# Patient Record
Sex: Female | Born: 1950 | Race: Black or African American | Hispanic: No | Marital: Married | Smoking: Never smoker
Health system: Southern US, Community
[De-identification: ages and names within clinical notes are randomized; demographics above are authoritative.]

## PROBLEM LIST (undated history)

## (undated) DIAGNOSIS — I10 Essential (primary) hypertension: Secondary | ICD-10-CM

## (undated) DIAGNOSIS — N189 Chronic kidney disease, unspecified: Secondary | ICD-10-CM

## (undated) DIAGNOSIS — I5020 Unspecified systolic (congestive) heart failure: Secondary | ICD-10-CM

## (undated) DIAGNOSIS — I428 Other cardiomyopathies: Secondary | ICD-10-CM

## (undated) DIAGNOSIS — E7800 Pure hypercholesterolemia, unspecified: Secondary | ICD-10-CM

## (undated) HISTORY — PX: FRACTURE SURGERY: SHX138

---

## 1996-04-11 HISTORY — PX: ABDOMINAL HYSTERECTOMY: SHX81

## 2001-11-07 ENCOUNTER — Encounter: Admission: RE | Admit: 2001-11-07 | Discharge: 2001-11-07 | Payer: Self-pay | Admitting: Family Medicine

## 2001-11-07 ENCOUNTER — Encounter: Payer: Self-pay | Admitting: Family Medicine

## 2002-10-29 ENCOUNTER — Encounter: Payer: Self-pay | Admitting: Otolaryngology

## 2002-10-29 ENCOUNTER — Encounter: Admission: RE | Admit: 2002-10-29 | Discharge: 2002-10-29 | Payer: Self-pay | Admitting: Otolaryngology

## 2003-04-10 ENCOUNTER — Other Ambulatory Visit: Admission: RE | Admit: 2003-04-10 | Discharge: 2003-04-10 | Payer: Self-pay | Admitting: Family Medicine

## 2004-05-07 ENCOUNTER — Other Ambulatory Visit: Admission: RE | Admit: 2004-05-07 | Discharge: 2004-05-07 | Payer: Self-pay | Admitting: Family Medicine

## 2005-06-02 ENCOUNTER — Emergency Department (HOSPITAL_COMMUNITY): Admission: EM | Admit: 2005-06-02 | Discharge: 2005-06-02 | Payer: Self-pay | Admitting: Emergency Medicine

## 2005-06-07 ENCOUNTER — Other Ambulatory Visit: Admission: RE | Admit: 2005-06-07 | Discharge: 2005-06-07 | Payer: Self-pay | Admitting: Family Medicine

## 2006-06-13 ENCOUNTER — Other Ambulatory Visit: Admission: RE | Admit: 2006-06-13 | Discharge: 2006-06-13 | Payer: Self-pay | Admitting: Family Medicine

## 2007-06-15 ENCOUNTER — Other Ambulatory Visit: Admission: RE | Admit: 2007-06-15 | Discharge: 2007-06-15 | Payer: Self-pay | Admitting: Family Medicine

## 2008-05-17 ENCOUNTER — Emergency Department (HOSPITAL_BASED_OUTPATIENT_CLINIC_OR_DEPARTMENT_OTHER): Admission: EM | Admit: 2008-05-17 | Discharge: 2008-05-17 | Payer: Self-pay | Admitting: Emergency Medicine

## 2008-05-17 ENCOUNTER — Ambulatory Visit: Payer: Self-pay | Admitting: Diagnostic Radiology

## 2008-06-18 ENCOUNTER — Other Ambulatory Visit: Admission: RE | Admit: 2008-06-18 | Discharge: 2008-06-18 | Payer: Self-pay | Admitting: Family Medicine

## 2008-11-05 ENCOUNTER — Encounter: Admission: RE | Admit: 2008-11-05 | Discharge: 2008-11-05 | Payer: Self-pay | Admitting: Family Medicine

## 2008-11-12 ENCOUNTER — Encounter: Admission: RE | Admit: 2008-11-12 | Discharge: 2008-11-12 | Payer: Self-pay | Admitting: Family Medicine

## 2008-11-26 ENCOUNTER — Ambulatory Visit: Payer: Self-pay | Admitting: Vascular Surgery

## 2009-07-29 ENCOUNTER — Emergency Department (HOSPITAL_BASED_OUTPATIENT_CLINIC_OR_DEPARTMENT_OTHER): Admission: EM | Admit: 2009-07-29 | Discharge: 2009-07-30 | Payer: Self-pay | Admitting: Emergency Medicine

## 2010-01-16 ENCOUNTER — Inpatient Hospital Stay (HOSPITAL_COMMUNITY): Admission: EM | Admit: 2010-01-16 | Discharge: 2010-01-19 | Payer: Self-pay | Admitting: Orthopedic Surgery

## 2010-01-16 ENCOUNTER — Ambulatory Visit: Payer: Self-pay | Admitting: Diagnostic Radiology

## 2010-01-16 ENCOUNTER — Encounter: Payer: Self-pay | Admitting: Orthopedic Surgery

## 2010-06-10 ENCOUNTER — Ambulatory Visit (HOSPITAL_COMMUNITY)
Admission: RE | Admit: 2010-06-10 | Discharge: 2010-06-10 | Disposition: A | Discharge status: Home / Self Care | Payer: BC Managed Care – PPO | Source: Ambulatory Visit | Attending: Orthopedic Surgery | Admitting: Orthopedic Surgery

## 2010-06-10 ENCOUNTER — Other Ambulatory Visit (HOSPITAL_COMMUNITY): Payer: Self-pay | Admitting: Orthopedic Surgery

## 2010-06-10 ENCOUNTER — Encounter (HOSPITAL_COMMUNITY)
Admission: RE | Admit: 2010-06-10 | Discharge: 2010-06-10 | Disposition: A | Discharge status: Home / Self Care | Payer: BC Managed Care – PPO | Source: Ambulatory Visit | Attending: Orthopedic Surgery | Admitting: Orthopedic Surgery

## 2010-06-10 DIAGNOSIS — S72309A Unspecified fracture of shaft of unspecified femur, initial encounter for closed fracture: Secondary | ICD-10-CM

## 2010-06-10 DIAGNOSIS — Z01810 Encounter for preprocedural cardiovascular examination: Secondary | ICD-10-CM | POA: Insufficient documentation

## 2010-06-10 DIAGNOSIS — I10 Essential (primary) hypertension: Secondary | ICD-10-CM | POA: Insufficient documentation

## 2010-06-10 DIAGNOSIS — Z01818 Encounter for other preprocedural examination: Secondary | ICD-10-CM | POA: Insufficient documentation

## 2010-06-10 DIAGNOSIS — Z01812 Encounter for preprocedural laboratory examination: Secondary | ICD-10-CM | POA: Insufficient documentation

## 2010-06-10 LAB — PROTIME-INR
INR: 1.01 (ref 0.00–1.49)
Prothrombin Time: 13.5 seconds (ref 11.6–15.2)

## 2010-06-10 LAB — COMPREHENSIVE METABOLIC PANEL
ALT: 19 U/L (ref 0–35)
Alkaline Phosphatase: 127 U/L — ABNORMAL HIGH (ref 39–117)
BUN: 20 mg/dL (ref 6–23)
CO2: 32 mEq/L (ref 19–32)
Calcium: 10.6 mg/dL — ABNORMAL HIGH (ref 8.4–10.5)
Creatinine, Ser: 1.27 mg/dL — ABNORMAL HIGH (ref 0.4–1.2)
GFR calc Af Amer: 52 mL/min — ABNORMAL LOW (ref >60)
GFR calc non Af Amer: 43 mL/min — ABNORMAL LOW (ref >60)
Glucose, Bld: 96 mg/dL (ref 70–99)
Potassium: 4.1 mEq/L (ref 3.5–5.1)
Sodium: 140 mEq/L (ref 135–145)
Total Bilirubin: 0.8 mg/dL (ref 0.3–1.2)
Total Protein: 7.3 g/dL (ref 6.0–8.3)

## 2010-06-10 LAB — URINALYSIS, ROUTINE W REFLEX MICROSCOPIC
Bilirubin Urine: NEGATIVE
Hgb urine dipstick: NEGATIVE
Ketones, ur: 15 mg/dL — AB
Nitrite: NEGATIVE
Specific Gravity, Urine: 1.027 (ref 1.005–1.030)
Urine Glucose, Fasting: NEGATIVE mg/dL
Urobilinogen, UA: 0.2 mg/dL (ref 0.0–1.0)
pH: 6 (ref 5.0–8.0)

## 2010-06-10 LAB — DIFFERENTIAL
Basophils Absolute: 0.1 10*3/uL (ref 0.0–0.1)
Basophils Relative: 1 % (ref 0–1)
Eosinophils Absolute: 0.3 10*3/uL (ref 0.0–0.7)
Eosinophils Relative: 6 % — ABNORMAL HIGH (ref 0–5)
Lymphocytes Relative: 43 % (ref 12–46)
Lymphs Abs: 2.2 10*3/uL (ref 0.7–4.0)
Monocytes Absolute: 0.5 10*3/uL (ref 0.1–1.0)
Neutro Abs: 2 10*3/uL (ref 1.7–7.7)
Neutrophils Relative %: 40 % — ABNORMAL LOW (ref 43–77)

## 2010-06-10 LAB — CBC
HCT: 46.5 % — ABNORMAL HIGH (ref 36.0–46.0)
Hemoglobin: 14.9 g/dL (ref 12.0–15.0)
MCHC: 32 g/dL (ref 30.0–36.0)
MCV: 81 fL (ref 78.0–100.0)
Platelets: 623 10*3/uL — ABNORMAL HIGH (ref 150–400)
RBC: 5.74 MIL/uL — ABNORMAL HIGH (ref 3.87–5.11)
RDW: 16.1 % — ABNORMAL HIGH (ref 11.5–15.5)
WBC: 5 10*3/uL (ref 4.0–10.5)

## 2010-06-10 LAB — URINE MICROSCOPIC-ADD ON

## 2010-06-10 LAB — SURGICAL PCR SCREEN: MRSA, PCR: NEGATIVE

## 2010-06-11 ENCOUNTER — Inpatient Hospital Stay (HOSPITAL_COMMUNITY): Payer: BC Managed Care – PPO

## 2010-06-11 ENCOUNTER — Inpatient Hospital Stay (HOSPITAL_COMMUNITY)
Admission: RE | Admit: 2010-06-11 | Discharge: 2010-06-15 | DRG: 210 | Disposition: A | Discharge status: Home Health | Payer: BC Managed Care – PPO | Source: Ambulatory Visit | Attending: Orthopedic Surgery | Admitting: Orthopedic Surgery

## 2010-06-11 DIAGNOSIS — Z86718 Personal history of other venous thrombosis and embolism: Secondary | ICD-10-CM

## 2010-06-11 DIAGNOSIS — E876 Hypokalemia: Secondary | ICD-10-CM | POA: Diagnosis not present

## 2010-06-11 DIAGNOSIS — I10 Essential (primary) hypertension: Secondary | ICD-10-CM | POA: Diagnosis present

## 2010-06-11 DIAGNOSIS — T84498A Other mechanical complication of other internal orthopedic devices, implants and grafts, initial encounter: Secondary | ICD-10-CM | POA: Diagnosis present

## 2010-06-11 DIAGNOSIS — Y658 Other specified misadventures during surgical and medical care: Secondary | ICD-10-CM | POA: Diagnosis not present

## 2010-06-11 DIAGNOSIS — Y9210 Unspecified residential institution as the place of occurrence of the external cause: Secondary | ICD-10-CM | POA: Diagnosis not present

## 2010-06-11 DIAGNOSIS — IMO0002 Reserved for concepts with insufficient information to code with codable children: Secondary | ICD-10-CM | POA: Diagnosis not present

## 2010-06-11 DIAGNOSIS — Z7982 Long term (current) use of aspirin: Secondary | ICD-10-CM

## 2010-06-11 DIAGNOSIS — D62 Acute posthemorrhagic anemia: Secondary | ICD-10-CM | POA: Diagnosis not present

## 2010-06-11 DIAGNOSIS — Z79899 Other long term (current) drug therapy: Secondary | ICD-10-CM

## 2010-06-11 DIAGNOSIS — E785 Hyperlipidemia, unspecified: Secondary | ICD-10-CM | POA: Diagnosis present

## 2010-06-11 DIAGNOSIS — Y831 Surgical operation with implant of artificial internal device as the cause of abnormal reaction of the patient, or of later complication, without mention of misadventure at the time of the procedure: Secondary | ICD-10-CM | POA: Diagnosis present

## 2010-06-11 LAB — GRAM STAIN

## 2010-06-11 LAB — HEMOGLOBIN AND HEMATOCRIT, BLOOD
HCT: 33.1 % — ABNORMAL LOW (ref 36.0–46.0)
Hemoglobin: 10.5 g/dL — ABNORMAL LOW (ref 12.0–15.0)

## 2010-06-12 LAB — CBC
MCH: 25.3 pg — ABNORMAL LOW (ref 26.0–34.0)
MCHC: 31 g/dL (ref 30.0–36.0)
MCV: 81.6 fL (ref 78.0–100.0)
Platelets: 379 10*3/uL (ref 150–400)
RBC: 3.91 MIL/uL (ref 3.87–5.11)

## 2010-06-12 LAB — BASIC METABOLIC PANEL
BUN: 10 mg/dL (ref 6–23)
CO2: 34 mEq/L — ABNORMAL HIGH (ref 19–32)
Calcium: 7.8 mg/dL — ABNORMAL LOW (ref 8.4–10.5)
Glucose, Bld: 117 mg/dL — ABNORMAL HIGH (ref 70–99)
Potassium: 3.8 mEq/L (ref 3.5–5.1)
Sodium: 143 mEq/L (ref 135–145)

## 2010-06-12 LAB — URINE CULTURE
Colony Count: >=100000
Culture  Setup Time: 201203012006

## 2010-06-12 LAB — GLUCOSE, CAPILLARY: Glucose-Capillary: 126 mg/dL — ABNORMAL HIGH (ref 70–99)

## 2010-06-12 LAB — PROTIME-INR: Prothrombin Time: 16.4 seconds — ABNORMAL HIGH (ref 11.6–15.2)

## 2010-06-13 LAB — BASIC METABOLIC PANEL
Calcium: 7.8 mg/dL — ABNORMAL LOW (ref 8.4–10.5)
Creatinine, Ser: 1.13 mg/dL (ref 0.4–1.2)
GFR calc Af Amer: 59 mL/min — ABNORMAL LOW (ref >60)
GFR calc non Af Amer: 49 mL/min — ABNORMAL LOW (ref >60)
Sodium: 137 mEq/L (ref 135–145)

## 2010-06-13 LAB — CBC
Platelets: 298 10*3/uL (ref 150–400)
RBC: 3.29 MIL/uL — ABNORMAL LOW (ref 3.87–5.11)
RDW: 16 % — ABNORMAL HIGH (ref 11.5–15.5)
WBC: 6.2 10*3/uL (ref 4.0–10.5)

## 2010-06-13 LAB — PROTIME-INR: INR: 2.17 — ABNORMAL HIGH (ref 0.00–1.49)

## 2010-06-14 LAB — HEMOGLOBIN AND HEMATOCRIT, BLOOD: Hemoglobin: 9.9 g/dL — ABNORMAL LOW (ref 12.0–15.0)

## 2010-06-14 LAB — CBC
MCV: 81.3 fL (ref 78.0–100.0)
Platelets: 368 10*3/uL (ref 150–400)
RDW: 16.1 % — ABNORMAL HIGH (ref 11.5–15.5)
WBC: 6.5 10*3/uL (ref 4.0–10.5)

## 2010-06-14 LAB — PROTIME-INR
INR: 1.69 — ABNORMAL HIGH (ref 0.00–1.49)
Prothrombin Time: 20.1 seconds — ABNORMAL HIGH (ref 11.6–15.2)

## 2010-06-14 LAB — BASIC METABOLIC PANEL
BUN: 8 mg/dL (ref 6–23)
BUN: 7 mg/dL (ref 6–23)
Calcium: 8 mg/dL — ABNORMAL LOW (ref 8.4–10.5)
Chloride: 98 mEq/L (ref 96–112)
Creatinine, Ser: 1.02 mg/dL (ref 0.4–1.2)
GFR calc Af Amer: >60 mL/min (ref >60)
GFR calc non Af Amer: 55 mL/min — ABNORMAL LOW (ref >60)
GFR calc non Af Amer: 55 mL/min — ABNORMAL LOW (ref >60)
Potassium: 3 mEq/L — ABNORMAL LOW (ref 3.5–5.1)

## 2010-06-14 LAB — TISSUE CULTURE: Culture: NO GROWTH

## 2010-06-14 NOTE — Op Note (Signed)
Tina, Jenkins                ACCOUNT NO.:  [id number]  MEDICAL RECORD NO.:  [id number]           PATIENT TYPE:  I  LOCATION:  5040                         FACILITY:  [facility]  PHYSICIAN:  Astrid Divine. Marcelino Scot, M.D. DATE OF BIRTH:  Feb 02, 1951  DATE OF PROCEDURE:  06/11/2010 DATE OF DISCHARGE:                              OPERATIVE REPORT   PREOPERATIVE DIAGNOSES: 1. Left subtrochanteric femur nonunion. 2. Broken intramedullary nail.  POSTOPERATIVE DIAGNOSES: 1. Left subtrochanteric femur nonunion. 2. Broken intramedullary nail.  PROCEDURE: 1. Repair of left subtrochanteric nonunion with direct repair,     autografting, and Infuse allografting as well as plating. 2. ORIF of left femoral neck. 3. Removal of deep implant broken nail.  SURGEON:  Astrid Divine. Marcelino Scot, M.D.  ASSISTANT:  Jari Pigg, [place].  ANESTHESIA:  General.  COMPLICATIONS:  Femoral neck fracture.  INTRAOPERATIVE FINDINGS:  No gross evidence of infection.  SPECIMENS:  Two anaerobic, aerobic, both sent to micro.  PATHOLOGIC FINDINGS:  No bacteria, no PMNs.  Ins: 1 unit of PRBC, approximately 3100 mL of crystalloid, 750 of Hextend.  URINE OUTPUT:  500 mL.  DISPOSITION:  PACU.  CONDITION:  Stable.  BRIEF SUMMARY AND INDICATIONS FOR PROCEDURE:  Tina Jenkins is a very pleasant 60 year old female who sustained a spontaneous fracture of her left subtrochanteric femur deemed secondary to prolonged bisphosphonate therapy.  She underwent intramedullary nailing using a Recon nail device in October 2011.  She initially did quite well but had persistent pain and had an acute change recently.  Followup x-rays demonstrated fracture of her nail and varus angulation with what appeared to be healing on the compression side and gapping on the lateral tension side.  We discussed preoperatively in the office as well as at the hospital the various methods available for repair of this fracture including revision  nailing with a more valgus oriented device or tension plating.  I did discuss this also with the orthopedic trauma colleagues and the consensus opinion was to proceed with tension band plating, the best device for which was felt to be the blade plate.  Tina Jenkins understood the risks to include failure of the nonunion repair, infection, nerve injury, vessel injury, blood loss, DVT, PE, heart attack, stroke, and multiple others and did wish to proceed.  BRIEF SUMMARY OF PROCEDURE:  Tina Jenkins received preoperative antibiotics, was taken to operating room where general anesthesia was induced.  Her left lower extremity was placed in traction on the traction bed.  The right hip was placed in the well leg in the lithotomy position, padding all prominences carefully and appropriately.  The old incisions were reopened and dissection carried down to the tip of the nail, the locking bolt distally, and the lag screws in the femoral head in the midportion of the wound.  These were taken out atraumatically.  I did leave one the screws engaged to help with engagement of the threads in the proximal portion of the nail.  I then placed 2 guidewires down the nail and used those to help me assist with withdrawal.  The canal was then reamed to 11.5 after  removal of the 10 mm nail to generate autograft for the fracture site internally as well as to collect any external reamings.  With a ball spike pusher, we did apply valgus orientation to the fracture during reaming.  Next attention was turned to the proximal femur where a guide pin was used to establish the appropriate position and orientation for the blade plate.  This was checked on AP and lateral images.  The seating chisel was then advanced into the proximal femur and carefully evaluated on AP and lateral images.  It appeared to be in good position; however, her neck and overall bone size was quite small.  During instrumentation, the femoral neck  appeared to fracture.  After evaluation in multiple planes, it was clear that we could not proceed as planned, instead the first priority was to repair the femoral neck.  This was done with a standard inverted triangle configuration of guide pins for 7.3 screws.  I then placed that the most central proximal cannulated screw in order to close the tension side of the fracture and then evaluated the femoral neck for possible placement of the blade plate.  This was a 135-degree plate that would facilitate both repair of the subtroch nonunion as well as a femoral neck fixation.  Carefully dialing in every 2 degrees or so of anteversion using c-arm for chisel orientation, it simply was too tight even in the center position of the bone to risk placement and I was concerned that this would ultimately result in further damage.  Consequently, we chose to go with the [facility] device selecting a 135-degree barrel plate configuration that would allow [place] to shift the subtroch portion into valgus.  This was placed in standard fashion over drilling a guide pin.  Prior to placing the DHS plate, I did incise the pseudocapsule around the subtroch nonunion removing the contents with a 10 blade and small curette and placing an Infuse sponge mixed with autograft from the reamings and allograft.  I did place some bone graft up through the lag screw hole which I then maneuvered medially toward the area of defect.  This was then followed by placement of a 70-mm lag screw, then the plate.  I did use the tensioner distally in order to shift the fracture in to some valgus and achieve compression on the tension side.  This appeared to be very effective.  Additional screws were then placed into the plate resulting in a valgus orientation with maintained repair of the femoral neck.  The wound was copiously irrigated and closed in standard layered fashion with 0 Vicryl for the vastus, #1 figure-of-eight for the tensor, 0 and  2- 0 for the deep and shallow subcu, and then staples for the skin. Sterile and gently compressive dressing was applied.  The patient was awakened from anesthesia and transported to PACU in stable condition. Ainsley Spinner, PA-C assisted me throughout the procedure as I did an additional scrub.  I did take down the well leg as soon as feasible and also placed the bed under the left operative leg as well.  The leg was brought into valgus to facilitate over correction of alignment for repair in this situation.  PROGNOSIS:  Tina Jenkins has had a very difficult problem with a subtrochanteric nonunion associated with bisphosphonate therapy and as such, the biologic environment of her fracture is challenging.  I did place Infuse allograft around the fracture site of the subtrochanteric region incising it with a knife and scraping out and  placing this graft prior to the plate.  Her difficulties were further complicated by intraoperative femoral neck fracture and clearly, she will need to be watched closely to make sure this goes on to unite.  This will change her weightbearing from as tolerated to touchdown.  She will be on aggressive DVT prophylaxis because of a history of DVT, which will include TED hose, mechanical SCDs or foot pumps as well as Lovenox while she transitions to long-term Coumadin therapy.  She will work with PT and OT.  Also, we will make sure that her [place] is restarted and she has adequate doses of vitamin D in addition to her calcium.  I have discussed these findings and factors with her husband and sister-in-law as well.     Astrid Divine. Marcelino Scot, M.D.     MHH/MEDQ  D:  06/11/2010  T:  06/12/2010  Job:  [id number]  Electronically Signed by Altamese Robinson M.D. on 06/14/2010 08:22:41 AM

## 2010-06-15 LAB — POCT I-STAT 4, (NA,K, GLUC, HGB,HCT)
Glucose, Bld: 143 mg/dL — ABNORMAL HIGH (ref 70–99)
Hemoglobin: 10.5 g/dL — ABNORMAL LOW (ref 12.0–15.0)
Hemoglobin: 6.5 g/dL — CL (ref 12.0–15.0)
Potassium: 3.5 mEq/L (ref 3.5–5.1)
Sodium: 142 mEq/L (ref 135–145)

## 2010-06-15 LAB — TYPE AND SCREEN
ABO/RH(D): O POS
Antibody Screen: NEGATIVE
Unit division: 0

## 2010-06-15 LAB — CBC
MCH: 25.2 pg — ABNORMAL LOW (ref 26.0–34.0)
MCHC: 30.5 g/dL (ref 30.0–36.0)
Platelets: 403 10*3/uL — ABNORMAL HIGH (ref 150–400)
RBC: 3.77 MIL/uL — ABNORMAL LOW (ref 3.87–5.11)

## 2010-06-15 LAB — PROTIME-INR: Prothrombin Time: 21 seconds — ABNORMAL HIGH (ref 11.6–15.2)

## 2010-06-15 LAB — BASIC METABOLIC PANEL
BUN: 8 mg/dL (ref 6–23)
Creatinine, Ser: 0.92 mg/dL (ref 0.4–1.2)
GFR calc non Af Amer: >60 mL/min (ref >60)

## 2010-06-16 LAB — ANAEROBIC CULTURE

## 2010-06-24 LAB — PROTIME-INR
INR: 0.92 (ref 0.00–1.49)
INR: 1.27 (ref 0.00–1.49)
INR: 1.97 — ABNORMAL HIGH (ref 0.00–1.49)
Prothrombin Time: 12.6 seconds (ref 11.6–15.2)

## 2010-06-24 LAB — URINALYSIS, ROUTINE W REFLEX MICROSCOPIC
Bilirubin Urine: NEGATIVE
Hgb urine dipstick: NEGATIVE
Ketones, ur: NEGATIVE mg/dL
Protein, ur: NEGATIVE mg/dL
Urobilinogen, UA: 0.2 mg/dL (ref 0.0–1.0)

## 2010-06-24 LAB — CBC
HCT: 29.4 % — ABNORMAL LOW (ref 36.0–46.0)
Hemoglobin: 9.2 g/dL — ABNORMAL LOW (ref 12.0–15.0)
Hemoglobin: 9.4 g/dL — ABNORMAL LOW (ref 12.0–15.0)
MCH: 26.3 pg (ref 26.0–34.0)
MCH: 27.6 pg (ref 26.0–34.0)
MCV: 87.2 fL (ref 78.0–100.0)
MCV: 88 fL (ref 78.0–100.0)
Platelets: 677 10*3/uL — ABNORMAL HIGH (ref 150–400)
RBC: 3.34 MIL/uL — ABNORMAL LOW (ref 3.87–5.11)
RBC: 3.58 MIL/uL — ABNORMAL LOW (ref 3.87–5.11)
RBC: 5.14 MIL/uL — ABNORMAL HIGH (ref 3.87–5.11)
RDW: 13.5 % (ref 11.5–15.5)
RDW: 14.9 % (ref 11.5–15.5)
WBC: 4.5 10*3/uL (ref 4.0–10.5)
WBC: 5.1 10*3/uL (ref 4.0–10.5)

## 2010-06-24 LAB — COMPREHENSIVE METABOLIC PANEL
AST: 35 U/L (ref 0–37)
Alkaline Phosphatase: 43 U/L (ref 39–117)
BUN: 9 mg/dL (ref 6–23)
CO2: 32 mEq/L (ref 19–32)
Chloride: 107 mEq/L (ref 96–112)
Creatinine, Ser: 1.16 mg/dL (ref 0.4–1.2)
GFR calc Af Amer: 58 mL/min — ABNORMAL LOW (ref >60)
GFR calc non Af Amer: 48 mL/min — ABNORMAL LOW (ref >60)
Potassium: 3.8 mEq/L (ref 3.5–5.1)
Total Bilirubin: 0.7 mg/dL (ref 0.3–1.2)

## 2010-06-24 LAB — URINE CULTURE: Colony Count: 2000

## 2010-06-24 LAB — DIFFERENTIAL
Basophils Absolute: 0 10*3/uL (ref 0.0–0.1)
Basophils Relative: 0 % (ref 0–1)
Eosinophils Absolute: 0.3 10*3/uL (ref 0.0–0.7)
Lymphs Abs: 1.5 10*3/uL (ref 0.7–4.0)
Neutrophils Relative %: 67 % (ref 43–77)

## 2010-06-24 LAB — VITAMIN D 1,25 DIHYDROXY: Vitamin D3 1, 25 (OH)2: 33 pg/mL

## 2010-06-24 LAB — BASIC METABOLIC PANEL
BUN: 10 mg/dL (ref 6–23)
CO2: 27 mEq/L (ref 19–32)
Calcium: 10.1 mg/dL (ref 8.4–10.5)
Chloride: 104 mEq/L (ref 96–112)
Creatinine, Ser: 1 mg/dL (ref 0.4–1.2)
GFR calc Af Amer: >60 mL/min (ref >60)
GFR calc non Af Amer: 45 mL/min — ABNORMAL LOW (ref >60)
Potassium: 3.3 mEq/L — ABNORMAL LOW (ref 3.5–5.1)
Sodium: 139 mEq/L (ref 135–145)

## 2010-06-24 LAB — VITAMIN D 25 HYDROXY (VIT D DEFICIENCY, FRACTURES): Vit D, 25-Hydroxy: 56 ng/mL (ref 30–89)

## 2010-06-24 LAB — URINE MICROSCOPIC-ADD ON

## 2010-06-29 LAB — URINE CULTURE

## 2010-06-29 LAB — CBC
HCT: 39 % (ref 36.0–46.0)
MCV: 84.8 fL (ref 78.0–100.0)
Platelets: 645 10*3/uL — ABNORMAL HIGH (ref 150–400)
RDW: 13.7 % (ref 11.5–15.5)
WBC: 10.3 10*3/uL (ref 4.0–10.5)

## 2010-06-29 LAB — URINE MICROSCOPIC-ADD ON

## 2010-06-29 LAB — URINALYSIS, ROUTINE W REFLEX MICROSCOPIC
Glucose, UA: NEGATIVE mg/dL
Ketones, ur: NEGATIVE mg/dL
Nitrite: POSITIVE — AB
Specific Gravity, Urine: 1.018 (ref 1.005–1.030)
pH: 6 (ref 5.0–8.0)

## 2010-06-29 LAB — DIFFERENTIAL
Basophils Absolute: 0.1 10*3/uL (ref 0.0–0.1)
Eosinophils Absolute: 0.1 10*3/uL (ref 0.0–0.7)
Lymphocytes Relative: 7 % — ABNORMAL LOW (ref 12–46)
Lymphs Abs: 0.7 10*3/uL (ref 0.7–4.0)
Neutro Abs: 8.8 10*3/uL — ABNORMAL HIGH (ref 1.7–7.7)

## 2010-06-29 LAB — BASIC METABOLIC PANEL
BUN: 24 mg/dL — ABNORMAL HIGH (ref 6–23)
Creatinine, Ser: 1.1 mg/dL (ref 0.4–1.2)
GFR calc non Af Amer: 51 mL/min — ABNORMAL LOW (ref >60)
Glucose, Bld: 119 mg/dL — ABNORMAL HIGH (ref 70–99)
Potassium: 3.3 mEq/L — ABNORMAL LOW (ref 3.5–5.1)

## 2010-07-27 LAB — COMPREHENSIVE METABOLIC PANEL
BUN: 24 mg/dL — ABNORMAL HIGH (ref 6–23)
CO2: 28 mEq/L (ref 19–32)
Chloride: 107 mEq/L (ref 96–112)
Creatinine, Ser: 1 mg/dL (ref 0.4–1.2)
GFR calc non Af Amer: 57 mL/min — ABNORMAL LOW (ref >60)
Glucose, Bld: 89 mg/dL (ref 70–99)
Total Bilirubin: 0.6 mg/dL (ref 0.3–1.2)

## 2010-07-27 LAB — URINALYSIS, ROUTINE W REFLEX MICROSCOPIC
Ketones, ur: NEGATIVE mg/dL
Nitrite: NEGATIVE
Specific Gravity, Urine: 1.022 (ref 1.005–1.030)
pH: 6 (ref 5.0–8.0)

## 2010-07-27 LAB — CBC
HCT: 46 % (ref 36.0–46.0)
MCHC: 32.5 g/dL (ref 30.0–36.0)
MCV: 83.9 fL (ref 78.0–100.0)
RBC: 5.49 MIL/uL — ABNORMAL HIGH (ref 3.87–5.11)
WBC: 4.4 10*3/uL (ref 4.0–10.5)

## 2010-07-27 LAB — POCT CARDIAC MARKERS
Myoglobin, poc: 72.6 ng/mL (ref 12–200)
Myoglobin, poc: 74.7 ng/mL (ref 12–200)

## 2010-07-27 LAB — DIFFERENTIAL
Lymphocytes Relative: 28 % (ref 12–46)
Monocytes Absolute: 0.4 10*3/uL (ref 0.1–1.0)
Monocytes Relative: 8 % (ref 3–12)
Neutro Abs: 2.4 10*3/uL (ref 1.7–7.7)

## 2010-07-27 LAB — APTT: aPTT: 36 seconds (ref 24–37)

## 2010-07-27 LAB — URINE CULTURE: Colony Count: 5000

## 2010-08-13 NOTE — Discharge Summary (Signed)
Jenkins, Tina                ACCOUNT NO.:  [id number]  MEDICAL RECORD NO.:  [id number]          PATIENT TYPE:  LOCATION:                                 FACILITY:  PHYSICIAN:  Astrid Divine. Marcelino Scot, M.D. DATE OF BIRTH:  1950-11-14  DATE OF ADMISSION: DATE OF DISCHARGE:                              DISCHARGE SUMMARY   DISCHARGE DIAGNOSIS:  Left subtrochanteric femur nonunion with broken hardware.  ADDITIONAL DISCHARGE DIAGNOSES: 1. Hypertension. 2. Hyperlipidemia. 3. History of deep venous thrombosis. 4. Acute blood loss anemia, status post 1 unit of packed red blood     cells, resolved. 5. Hypokalemia, resolved.  BRIEF HISTORY AND HOSPITAL COURSE:  Ms. Hardy is a very pleasant 60- year-old African American female, who is well known to the Orthopedic Trauma Service, who was initially seen back in October 2011 where she sustained a ground-level fall resulting in a subtrochanteric femur fracture, which was felt to be related to Fosamax use.  She was treated with intramedullary nail and we followed her very closely.  We thought that the patient was healed.  Unfortunately, she presented back to the office with complaints of acute onset of thigh pain.  X-rays were performed, which demonstrated subtrochanteric nonunion to the left femur as well as the hardware fracture.  The patient was scheduled for surgery, was brought back to surgery where nail was removed and blade plate construct was inserted.  The patient tolerated the procedure well without any acute issues.  She was admitted for observation and pain control.  Ms. [facility] hospital stay was 4 days in length after surgery. She did not have any significantly acute issues.  She did have some issues with low H and H.  Ultimately on postoperative day #3, we opted to transfuse her with a 1 unit of packed red blood cell as she was symptomatic.  She tolerated this very well and on postoperative day #4, she was deemed stable for  discharge.  We did use Lovenox bridge and Coumadin for DVT, PE prophylaxis as well.  At the time of discharge, Ms. Grandison was tolerating diet well.  Pain was under control with oral medications and she was mobilizing very well with physical therapy.  DISCHARGE MEDICATIONS:  Include: 1. Glucosamine. 2. Magnesium over-the-counter. 3. Coumadin per Pharmacy protocol for an INR between 2 and 3. 4. Calcium and vitamin D over-the-counter. 5. Multivitamins. 6. Aspirin 81 mg p.o. daily. 7. Lipitor 20 mg p.o. daily. 8. Hydrochlorothiazide 25 mg p.o. daily in addition to Norco 5/325     p.o. q.4-6 hours as needed for pain. 9. Forteo 20 mcg subcu daily.  DISCHARGE INSTRUCTIONS AND PLANS:  Ms. Jaramillo did sustain an unfortunate complication, which we hopefully repaired and she will go on and heal uneventfully from this point.  We will continue to augment her healing process and a bone stimulator.  She will be essentially touchdown weightbearing on her left lower extremity for the next 6-8 weeks.  No range of motion restrictions will be set forth and she will continue to work with home health physical therapy until we can advance her to outpatient therapy.  We will continue to monitor her bone health and the need for any additional pharmacologic bone agents.  The patient is on Forteo right now as she has essentially failed bisphosphate therapy.  We will continue to monitor her labs and her Forteo use and may check lab markers in a couple of months to ensure that she is adequately building bone and still not in a resorptive mode.  We will see the patient back in 2 weeks for evaluation, followup x-rays.     Jari Pigg, PA   ______________________________ Astrid Divine. Marcelino Scot, M.D.    KWP/MEDQ  D:  07/07/2010  T:  07/08/2010  Job:  [id number]  Electronically Signed by Ainsley Spinner PA on 07/28/2010 12:46:22 PM Electronically Signed by Altamese [place] M.D. on 08/13/2010 07:41:42 AM

## 2010-08-27 NOTE — Assessment & Plan Note (Signed)
OFFICE VISIT   Tina Jenkins, Tina Jenkins  DOB:  11/26/1950                                       11/26/2008  [id number]   The patient is a 60 year old female referred by Dr. 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  The patient states that  she had been having fairly severe headaches several weeks ago.  Initially this was thought to be due to Neurontin and she was tapered  off of this.  The headaches have improved significantly.  She had gone  from daily headaches that were very severe to now a very mild headache  every few days.  She denies any history of TIA, amaurosis or stroke.  Her atherosclerotic risk factors include hypertension and elevated  cholesterol.  She has no family history of aneurysms.   PAST MEDICAL HISTORY:  Otherwise fairly unremarkable.   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   MEDICATIONS:  1. Hydrochlorothiazide 25 mg once a day.  2. Lipitor 20 mg once a day.  3. Centrum once daily.  4. Caltrate 600 two daily.  5. Fosamax 70 mg once a week.  6. Glucosamine 2 daily.  7. Ambien 2 daily.  8. Aspirin once daily.   She has no known drug allergies.   FAMILY HISTORY:  Remarkable for her father and brothers, who had  coronary problems.   SOCIAL HISTORY:  She is married, works as an Merchandiser, retail.  She has  one 1 child.  She is a nonsmoker, a nonconsumer of alcohol.   REVIEW OF SYSTEMS:  She is 4 feet 11 inches, 150 pounds.  She denies  recent weight loss or gain.  Cardiac, pulmonary, GI, renal, vascular,  orthopedic, psychiatric, ENT and hematologic review of systems are all  negative.  Neurologic:  She has had some occasional dizziness and  headaches recently as mentioned above.   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  She had a carotid duplex exam on November 05, 2008, which showed no  significant carotid stenosis.   I had a lengthy discussion with the patient and her husband today  regarding the location of the patient's aneurysm as well as the size and  possible treatment options.  The aneurysm is actually within the skull  and not in a place that is reachable through a neck incision.  I  discussed these findings with Dr. Earle Gell from neurosurgery today.  Although the aneurysm is fairly small and currently I believe  asymptomatic, she will see him in consultation within the next few weeks  for further review.  She will follow up with me on an as-needed basis.   Jessy Oto. Fields, MD  Electronically Signed   CEF/MEDQ  D:  11/27/2008  T:  11/27/2008  Job:  [id number]   cc:   Milford Cage. Laurann Montana, M.D.  Ophelia Charter, M.D.

## 2010-09-14 ENCOUNTER — Other Ambulatory Visit: Payer: Self-pay | Admitting: Orthopedic Surgery

## 2010-09-14 ENCOUNTER — Ambulatory Visit
Admission: RE | Admit: 2010-09-14 | Discharge: 2010-09-14 | Disposition: A | Discharge status: Home / Self Care | Payer: BC Managed Care – PPO | Source: Ambulatory Visit | Attending: Orthopedic Surgery | Admitting: Orthopedic Surgery

## 2010-09-14 DIAGNOSIS — S72009A Fracture of unspecified part of neck of unspecified femur, initial encounter for closed fracture: Secondary | ICD-10-CM

## 2010-09-14 DIAGNOSIS — S7223XA Displaced subtrochanteric fracture of unspecified femur, initial encounter for closed fracture: Secondary | ICD-10-CM

## 2010-12-03 ENCOUNTER — Other Ambulatory Visit: Payer: Self-pay | Admitting: Orthopedic Surgery

## 2010-12-03 ENCOUNTER — Ambulatory Visit
Admission: RE | Admit: 2010-12-03 | Discharge: 2010-12-03 | Disposition: A | Discharge status: Home / Self Care | Payer: BC Managed Care – PPO | Source: Ambulatory Visit | Attending: Orthopedic Surgery | Admitting: Orthopedic Surgery

## 2010-12-03 DIAGNOSIS — M25559 Pain in unspecified hip: Secondary | ICD-10-CM

## 2010-12-03 DIAGNOSIS — R1030 Lower abdominal pain, unspecified: Secondary | ICD-10-CM

## 2011-06-16 ENCOUNTER — Other Ambulatory Visit: Payer: Self-pay | Admitting: Orthopedic Surgery

## 2011-06-16 DIAGNOSIS — M25551 Pain in right hip: Secondary | ICD-10-CM

## 2011-06-23 ENCOUNTER — Ambulatory Visit
Admission: RE | Admit: 2011-06-23 | Discharge: 2011-06-23 | Disposition: A | Discharge status: Home / Self Care | Payer: BC Managed Care – PPO | Source: Ambulatory Visit | Attending: Orthopedic Surgery | Admitting: Orthopedic Surgery

## 2011-06-23 DIAGNOSIS — M25551 Pain in right hip: Secondary | ICD-10-CM

## 2012-08-26 ENCOUNTER — Encounter (HOSPITAL_BASED_OUTPATIENT_CLINIC_OR_DEPARTMENT_OTHER): Payer: Self-pay | Admitting: *Deleted

## 2012-08-26 ENCOUNTER — Emergency Department (HOSPITAL_BASED_OUTPATIENT_CLINIC_OR_DEPARTMENT_OTHER)
Admission: EM | Admit: 2012-08-26 | Discharge: 2012-08-27 | Disposition: A | Discharge status: Home / Self Care | Payer: BC Managed Care – PPO | Attending: Emergency Medicine | Admitting: Emergency Medicine

## 2012-08-26 ENCOUNTER — Emergency Department (HOSPITAL_BASED_OUTPATIENT_CLINIC_OR_DEPARTMENT_OTHER): Payer: BC Managed Care – PPO

## 2012-08-26 DIAGNOSIS — Z90710 Acquired absence of both cervix and uterus: Secondary | ICD-10-CM | POA: Insufficient documentation

## 2012-08-26 DIAGNOSIS — N201 Calculus of ureter: Secondary | ICD-10-CM | POA: Insufficient documentation

## 2012-08-26 DIAGNOSIS — R6883 Chills (without fever): Secondary | ICD-10-CM | POA: Insufficient documentation

## 2012-08-26 DIAGNOSIS — I10 Essential (primary) hypertension: Secondary | ICD-10-CM | POA: Insufficient documentation

## 2012-08-26 DIAGNOSIS — R109 Unspecified abdominal pain: Secondary | ICD-10-CM | POA: Insufficient documentation

## 2012-08-26 DIAGNOSIS — E7800 Pure hypercholesterolemia, unspecified: Secondary | ICD-10-CM | POA: Insufficient documentation

## 2012-08-26 DIAGNOSIS — R110 Nausea: Secondary | ICD-10-CM | POA: Insufficient documentation

## 2012-08-26 DIAGNOSIS — Z7982 Long term (current) use of aspirin: Secondary | ICD-10-CM | POA: Insufficient documentation

## 2012-08-26 DIAGNOSIS — R52 Pain, unspecified: Secondary | ICD-10-CM | POA: Insufficient documentation

## 2012-08-26 DIAGNOSIS — Z79899 Other long term (current) drug therapy: Secondary | ICD-10-CM | POA: Insufficient documentation

## 2012-08-26 DIAGNOSIS — N390 Urinary tract infection, site not specified: Secondary | ICD-10-CM | POA: Insufficient documentation

## 2012-08-26 HISTORY — DX: Pure hypercholesterolemia, unspecified: E78.00

## 2012-08-26 HISTORY — DX: Essential (primary) hypertension: I10

## 2012-08-26 LAB — CBC WITH DIFFERENTIAL/PLATELET
Basophils Absolute: 0 10*3/uL (ref 0.0–0.1)
Basophils Relative: 0 % (ref 0–1)
Eosinophils Absolute: 0.2 10*3/uL (ref 0.0–0.7)
Eosinophils Relative: 2 % (ref 0–5)
HCT: 39 % (ref 36.0–46.0)
Hemoglobin: 12.8 g/dL (ref 12.0–15.0)
Lymphocytes Relative: 6 % — ABNORMAL LOW (ref 12–46)
Lymphs Abs: 0.5 10*3/uL — ABNORMAL LOW (ref 0.7–4.0)
MCH: 28.1 pg (ref 26.0–34.0)
MCHC: 32.8 g/dL (ref 30.0–36.0)
MCV: 85.7 fL (ref 78.0–100.0)
Monocytes Absolute: 0.8 10*3/uL (ref 0.1–1.0)
Monocytes Relative: 9 % (ref 3–12)
Neutro Abs: 7.6 10*3/uL (ref 1.7–7.7)
Neutrophils Relative %: 83 % — ABNORMAL HIGH (ref 43–77)
Platelets: 595 10*3/uL — ABNORMAL HIGH (ref 150–400)
RBC: 4.55 MIL/uL (ref 3.87–5.11)
RDW: 16.2 % — ABNORMAL HIGH (ref 11.5–15.5)
WBC: 9.2 10*3/uL (ref 4.0–10.5)

## 2012-08-26 LAB — BASIC METABOLIC PANEL
BUN: 20 mg/dL (ref 6–23)
CO2: 26 mEq/L (ref 19–32)
Calcium: 9.4 mg/dL (ref 8.4–10.5)
Chloride: 101 mEq/L (ref 96–112)
Creatinine, Ser: 1.1 mg/dL (ref 0.50–1.10)
GFR calc Af Amer: 61 mL/min — ABNORMAL LOW (ref >90)
GFR calc non Af Amer: 52 mL/min — ABNORMAL LOW (ref >90)
Glucose, Bld: 156 mg/dL — ABNORMAL HIGH (ref 70–99)
Potassium: 3.4 mEq/L — ABNORMAL LOW (ref 3.5–5.1)
Sodium: 141 mEq/L (ref 135–145)

## 2012-08-26 LAB — URINALYSIS, ROUTINE W REFLEX MICROSCOPIC
Glucose, UA: NEGATIVE mg/dL
Ketones, ur: NEGATIVE mg/dL
Protein, ur: NEGATIVE mg/dL

## 2012-08-26 LAB — URINE MICROSCOPIC-ADD ON

## 2012-08-26 MED ORDER — SODIUM CHLORIDE 0.9 % IV BOLUS (SEPSIS)
1000.0000 mL | Freq: Once | INTRAVENOUS | Status: AC
  Administered 2012-08-26: 1000 mL via INTRAVENOUS

## 2012-08-26 MED ORDER — CIPROFLOXACIN IN D5W 400 MG/200ML IV SOLN
400.0000 mg | Freq: Once | INTRAVENOUS | Status: AC
  Administered 2012-08-26: 400 mg via INTRAVENOUS
  Filled 2012-08-26: qty 200

## 2012-08-26 MED ORDER — MORPHINE SULFATE 4 MG/ML IJ SOLN
6.0000 mg | Freq: Once | INTRAMUSCULAR | Status: AC
  Administered 2012-08-26: 4 mg via INTRAVENOUS
  Filled 2012-08-26: qty 1

## 2012-08-26 MED ORDER — DEXTROSE 5 % IV SOLN
1.0000 g | Freq: Once | INTRAVENOUS | Status: AC
  Administered 2012-08-27: 1 g via INTRAVENOUS
  Filled 2012-08-26: qty 10

## 2012-08-26 NOTE — ED Notes (Signed)
Pt reports generalized chills and body aches also tenderness L flank on palpation urine results returned at this time

## 2012-08-26 NOTE — ED Notes (Signed)
Pt states she has had back pain off and on all week. Now pain has radiated to the left side. Also c/o chills and flu-like s/s.

## 2012-08-27 MED ORDER — HYDROCODONE-ACETAMINOPHEN 5-325 MG PO TABS: 1.0000 | ORAL_TABLET | Freq: Four times a day (QID) | ORAL | Status: DC | PRN

## 2012-08-27 MED ORDER — CIPROFLOXACIN HCL 500 MG PO TABS: 500.0000 mg | ORAL_TABLET | Freq: Two times a day (BID) | ORAL | Status: DC

## 2012-08-27 NOTE — ED Provider Notes (Signed)
History     CSN: [id number]  Arrival date & time 08/26/12  1941   First MD Initiated Contact with Patient 08/26/12 2032      Chief Complaint  Patient presents with  . Back Pain    (Consider location/radiation/quality/duration/timing/severity/associated sxs/prior treatment) HPI Patient presents to the emergency department with left flank pain with chills, and body aches.  Patient, states, that earlier in the week.  She had some body aches, and chills, but that resolved patient, states she again, having flank pain with chills, and nausea earlier, again today.  Patient denies chest pain, shortness of breath, headache, blurred vision, lethargy, weakness, vomiting, or syncope.  Patient, states she did not take anything prior to arrival for her symptoms.  Nothing seems to make her condition, better or worse. Past Medical History  Diagnosis Date  . Hypertension   . Hypercholesteremia     Past Surgical History  Procedure Laterality Date  . Abdominal hysterectomy    . Fracture surgery      History reviewed. No pertinent family history.  History  Substance Use Topics  . Smoking status: Never Smoker   . Smokeless tobacco: Not on file  . Alcohol Use: No    OB History   Grav Para Term Preterm Abortions TAB SAB Ect Mult Living                  Review of Systems All other systems negative except as documented in the HPI. All pertinent positives and negatives as reviewed in the HPI. Allergies  Review of patient's allergies indicates no known allergies.  Home Medications   Current Outpatient Rx  Name  Route  Sig  Dispense  Refill  . aspirin 81 MG chewable tablet   Oral   Chew 81 mg by mouth daily.         Marland Kitchen atorvastatin (LIPITOR) 20 MG tablet   Oral   Take 20 mg by mouth daily.         . Calcium Carbonate-Vitamin D (CALTRATE 600+D PO)   Oral   Take by mouth.         Marland Kitchen glucosamine-chondroitin 500-400 MG tablet   Oral   Take 1 tablet by mouth 3 (three) times  daily.         . hydrochlorothiazide (HYDRODIURIL) 25 MG tablet   Oral   Take 25 mg by mouth daily.         . Multiple Vitamins-Minerals (MULTIVITAMIN PO)   Oral   Take by mouth.           BP 131/77  Pulse 92  Temp(Src) 100.1 F (37.8 C) (Oral)  Resp 20  Ht 4\' 11"  (1.499 m)  Wt 145 lb (65.772 kg)  BMI 29.27 kg/m2  SpO2 95%  Physical Exam  Nursing note and vitals reviewed. Constitutional: She is oriented to person, place, and time. She appears well-developed and well-nourished. No distress.  HENT:  Head: Normocephalic and atraumatic.  Mouth/Throat: Oropharynx is clear and moist.  Eyes: Pupils are equal, round, and reactive to light.  Neck: Normal range of motion. Neck supple.  Cardiovascular: Normal rate, regular rhythm and normal heart sounds.  Exam reveals no gallop and no friction rub.   No murmur heard. Pulmonary/Chest: Effort normal and breath sounds normal.  Abdominal: Soft. Bowel sounds are normal.  Neurological: She is alert and oriented to person, place, and time.  Skin: Skin is warm and dry.    ED Course  Procedures (including critical care time)  Labs Reviewed  URINALYSIS, ROUTINE W REFLEX MICROSCOPIC - Abnormal; Notable for the following:    APPearance CLOUDY (*)    Hgb urine dipstick MODERATE (*)    Nitrite POSITIVE (*)    Leukocytes, UA LARGE (*)    All other components within normal limits  URINE MICROSCOPIC-ADD ON - Abnormal; Notable for the following:    Bacteria, UA MANY (*)    All other components within normal limits  BASIC METABOLIC PANEL - Abnormal; Notable for the following:    Potassium 3.4 (*)    Glucose, Bld 156 (*)    GFR calc non Af Amer 52 (*)    GFR calc Af Amer 61 (*)    All other components within normal limits  CBC WITH DIFFERENTIAL - Abnormal; Notable for the following:    RDW 16.2 (*)    Platelets 595 (*)    Neutrophils Relative % 83 (*)    Lymphocytes Relative 6 (*)    Lymphs Abs 0.5 (*)    All other components  within normal limits  URINE CULTURE  URINALYSIS, ROUTINE W REFLEX MICROSCOPIC   Ct Abdomen Pelvis Wo Contrast  08/26/2012   *RADIOLOGY REPORT*  Clinical Data: Left flank pain.  Urolithiasis.  CT ABDOMEN AND PELVIS WITHOUT CONTRAST  Technique:  Multidetector CT imaging of the abdomen and pelvis was performed following the standard protocol without intravenous contrast.  Comparison: None.  Findings: A 6 mm calculus is seen in the region of the left ureteral pelvic junction, without significant hydronephrosis.  Mild to moderate diffuse left renal parenchymal atrophy is noted.  No other ureteral calculi identified.  No evidence of right-sided hydronephrosis.  A tiny 1 mm nonobstructing intrarenal calculus is noted the posterior mid pole left kidney.  Noncontrast images of the liver, gallbladder, pancreas, spleen, adrenal glands and right kidney are normal appearance.  No soft tissue masses identified.  Diverticulosis is noted, however there is no evidence of diverticulitis.  No other inflammatory process or abnormal fluid collections are seen.  No evidence of dilated bowel loops.  Internal fixation hardware is noted in the left hip.  IMPRESSION:  1.  6 mm calculus at the left ureteropelvic junction, without significant hydronephrosis. 2.  Mild to moderate left renal parenchymal atrophy. 3.  Diverticulosis.  No radiographic evidence of diverticulitis.   Original Report Authenticated By: Earle Gell, M.D.     Patient will be discharged home.  I spoke with Dr.ottelin and he will see the patient in his office in the morning.  Patient is advised to return here for any worsening in her condition.  She has been hydrated and given IV antibiotics.  Patient is feeling some better following the fluids and pain medication.   MDM  MDM Reviewed: vitals, previous chart and nursing note Interpretation: labs and CT scan Consults: urology           Brent General, PA-C 08/27/12 [phone]

## 2012-08-27 NOTE — ED Notes (Signed)
Rocephin complete iv site d/c'd pt reports feeling much better

## 2012-08-27 NOTE — ED Provider Notes (Signed)
Medical screening examination/treatment/procedure(s) were performed by non-physician practitioner and as supervising physician I was immediately available for consultation/collaboration.   Jazyah Butsch B. Karle Starch, MD 08/27/12 1444

## 2012-08-28 ENCOUNTER — Other Ambulatory Visit: Payer: Self-pay | Admitting: Urology

## 2012-08-28 LAB — URINE CULTURE: Colony Count: >=100000

## 2012-08-29 ENCOUNTER — Encounter (HOSPITAL_COMMUNITY): Payer: Self-pay | Admitting: *Deleted

## 2012-08-29 NOTE — ED Notes (Signed)
Post ED Visit - Positive Culture Follow-up  Culture report reviewed by antimicrobial stewardship pharmacist: []  Wes [place], Pharm.D., BCPS []  Heide Guile, Pharm.D., BCPS [x]  Alycia Rossetti, Pharm.D., BCPS []  [place], [place].D., BCPS, AAHIVP []  Legrand Como, Pharm.D., BCPS, AAHIV  Positive Urine culture  no further patient follow-up is required at this time.  Varney Baas 08/29/2012, 3:23 PM

## 2012-08-30 ENCOUNTER — Encounter (HOSPITAL_COMMUNITY): Payer: Self-pay | Admitting: Pharmacy Technician

## 2012-09-05 MED ORDER — GENTAMICIN SULFATE 40 MG/ML IJ SOLN
260.0000 mg | INTRAVENOUS | Status: AC
  Administered 2012-09-06: 260 mg via INTRAVENOUS
  Filled 2012-09-05: qty 6.5

## 2012-09-06 ENCOUNTER — Encounter (HOSPITAL_COMMUNITY)
Admission: RE | Disposition: A | Discharge status: Home / Self Care | Payer: Self-pay | Source: Ambulatory Visit | Attending: Urology

## 2012-09-06 ENCOUNTER — Ambulatory Visit (HOSPITAL_COMMUNITY): Payer: BC Managed Care – PPO

## 2012-09-06 ENCOUNTER — Encounter (HOSPITAL_COMMUNITY): Payer: Self-pay

## 2012-09-06 ENCOUNTER — Ambulatory Visit (HOSPITAL_COMMUNITY)
Admission: RE | Admit: 2012-09-06 | Discharge: 2012-09-06 | Disposition: A | Discharge status: Home / Self Care | Payer: BC Managed Care – PPO | Source: Ambulatory Visit | Attending: Urology | Admitting: Urology

## 2012-09-06 DIAGNOSIS — N201 Calculus of ureter: Secondary | ICD-10-CM | POA: Insufficient documentation

## 2012-09-06 DIAGNOSIS — Z87442 Personal history of urinary calculi: Secondary | ICD-10-CM | POA: Insufficient documentation

## 2012-09-06 DIAGNOSIS — E7800 Pure hypercholesterolemia, unspecified: Secondary | ICD-10-CM | POA: Insufficient documentation

## 2012-09-06 DIAGNOSIS — I10 Essential (primary) hypertension: Secondary | ICD-10-CM | POA: Insufficient documentation

## 2012-09-06 DIAGNOSIS — Z86718 Personal history of other venous thrombosis and embolism: Secondary | ICD-10-CM | POA: Insufficient documentation

## 2012-09-06 HISTORY — DX: Chronic kidney disease, unspecified: N18.9

## 2012-09-06 SURGERY — LITHOTRIPSY, ESWL
Anesthesia: LOCAL | Laterality: Left

## 2012-09-06 MED ORDER — DIPHENHYDRAMINE HCL 25 MG PO CAPS
25.0000 mg | ORAL_CAPSULE | ORAL | Status: AC
  Administered 2012-09-06: 25 mg via ORAL
  Filled 2012-09-06: qty 1

## 2012-09-06 MED ORDER — DIAZEPAM 5 MG PO TABS
10.0000 mg | ORAL_TABLET | ORAL | Status: AC
  Administered 2012-09-06: 10 mg via ORAL
  Filled 2012-09-06: qty 2

## 2012-09-06 MED ORDER — SODIUM CHLORIDE 0.9 % IV SOLN
INTRAVENOUS | Status: DC
  Administered 2012-09-06: 11:00:00 via INTRAVENOUS

## 2012-09-06 NOTE — Progress Notes (Signed)
Pt stated to [facility] nurse that she drank about 8oz. Water around 0800.  Due to drinking the water pt came back to room to wait about another hour before having her lithotripsy.  Pt received No sedation on the litho truck yet.  Pt in room, watching tv, family at bedside.

## 2012-09-06 NOTE — Progress Notes (Signed)
Patient returned to [employer] truck via w/c w RN

## 2012-09-06 NOTE — H&P (Signed)
Tina Jenkins is an 62 y.o. female.    Chief Complaint: Pre-Op Left Shockwave Lithotripsy  HPI:   1 - Left Ureteral Stone - Pt with 43mm left UPJ stone, 1000HU, SSD 6cm by CT in ER 08/26/12 on work-up for left flank pain. Easily visible on scout images. Also possible ipsilateral 63mm punctate stone, no others. No sig hydro or stranding. Pt with pyuria noted as well with + LE. No fever or leukocytosis. Marland Kitchen 1 piror stone episode 30 years ago.  PMH sig for TAH, ortho surgeries, open left ureterolithotomy in 1970s in [place]. No CV disese. One DVT at time of ortho surgery now not on any strong blood thinners.  Today Tina Jenkins is seen as new to proceed with left shockwave lithotripsy. Most recent UCX negative x2. No interval fevers or stone passage.  Past Medical History  Diagnosis Date  . Hypertension   . Hypercholesteremia   . Chronic kidney disease     kidney stones    Past Surgical History  Procedure Laterality Date  . Fracture surgery  2011, 06/2010    left femur  . Abdominal hysterectomy  1998    History reviewed. No pertinent family history. Social History:  reports that she has never smoked. She does not have any smokeless tobacco history on file. She reports that she does not drink alcohol or use illicit drugs.  Allergies: No Known Allergies  No prescriptions prior to admission    No results found for this or any previous visit (from the past 48 hour(s)). No results found.  Review of Systems  Constitutional: Negative.  Negative for fever and chills.  HENT: Negative.   Eyes: Negative.   Respiratory: Negative.   Cardiovascular: Negative.   Gastrointestinal: Negative.   Genitourinary: Negative.   Musculoskeletal: Negative.   Skin: Negative.   Neurological: Negative.   Endo/Heme/Allergies: Negative.   Psychiatric/Behavioral: Negative.     There were no vitals taken for this visit. Physical Exam  Constitutional: She is oriented to person, place, and time. She appears  well-developed and well-nourished.  HENT:  Head: Normocephalic and atraumatic.  Eyes: EOM are normal. Pupils are equal, round, and reactive to light.  Neck: Normal range of motion. Neck supple.  Cardiovascular: Normal rate and regular rhythm.   Respiratory: Effort normal and breath sounds normal.  GI: Soft. Bowel sounds are normal.  Genitourinary:  Minimal CVAT  Musculoskeletal: Normal range of motion.  Neurological: She is alert and oriented to person, place, and time.  Skin: Skin is warm and dry.  Psychiatric: She has a normal mood and affect. Her behavior is normal. Judgment and thought content normal.     Assessment/Plan  1 - Left Ureteral Stone - We re-discussed shockwave lithotripsy in detail as well as my "rule of 9s" with stones <60mm, less than 900 HU, and skin to stone distance <9cm having approximately 90% treatment success with single session of treatment. We then addressed how stones that are larger, more dense, and in patients with less favorable anatomy have incrementally decreased success rates. We re-discussed risks including, bleeding, infection, hematoma, loss of kidney, need for staged therapy, need for adjunctive therapy and requirement to refrain from any anticoagulants, anti-platelet or aspirin-like products peri-procedureally. After careful consideration, the patient has chosen to proceed.   Gabryella Murfin 09/06/2012, 6:41 AM

## 2014-09-29 ENCOUNTER — Ambulatory Visit: Payer: Self-pay | Admitting: Podiatry

## 2014-10-06 ENCOUNTER — Ambulatory Visit: Payer: Self-pay | Admitting: Podiatry

## 2014-10-20 ENCOUNTER — Encounter: Payer: Self-pay | Admitting: Podiatry

## 2014-10-20 ENCOUNTER — Ambulatory Visit (INDEPENDENT_AMBULATORY_CARE_PROVIDER_SITE_OTHER): Payer: Medicare Other

## 2014-10-20 ENCOUNTER — Ambulatory Visit (INDEPENDENT_AMBULATORY_CARE_PROVIDER_SITE_OTHER): Payer: Medicare Other | Admitting: Podiatry

## 2014-10-20 VITALS — BP 147/93 | HR 78 | Resp 18

## 2014-10-20 DIAGNOSIS — S93609A Unspecified sprain of unspecified foot, initial encounter: Secondary | ICD-10-CM

## 2014-10-20 DIAGNOSIS — R52 Pain, unspecified: Secondary | ICD-10-CM

## 2014-10-20 DIAGNOSIS — M19071 Primary osteoarthritis, right ankle and foot: Secondary | ICD-10-CM

## 2014-10-20 NOTE — Progress Notes (Signed)
   Subjective:    Patient ID: Tina Jenkins, female    DOB: 1951/01/28, 64 y.o.   MRN: [id number]  HPI I HAD SOME SURGERIES ON BOTH OF MY FEET AND I WALK ON GRAVEL AT HOME AND WHEN I HAD SURGERY ON MY LEFT FOOT IT MADE IT SHORTER AND THEY TOLD ME TO PUT AN INSERT IN ON MY RIGHT FOOT AND I WALK ON THE OUTSIDE OF MY LEFT FOOT This patient presents to the office with pain on outside bottom of her feet.  She relates having arthritis on top of her left foot.  She gives history of hip fractures corrected surgically with her left side elongating.  Her right foot is shorter than her right foot.n She points to her outside of both feet as the areas of pain.  She has tried diabetic insoles with unsuccessful treatment..   Review of Systems  All other systems reviewed and are negative.      Objective:   Physical Exam Objective: Review of past medical history, medications, social history and allergies were performed.  Vascular: Dorsalis pedis and posterior tibial pulses were palpable B/L, capillary refill was  WNL B/L, temperature gradient was WNL B/L   Skin:  No signs of symptoms of infection or ulcers on both feet  Nails: appear healthy with no signs of mycosis or infections  Sensory: Semmes Weinstein monifilament WNL   Orthopedic: Orthopedic evaluation demonstrates all joints distal t ankle have full ROM without crepitus, muscle power WNL B/L.  Palpable pain on dorsum of right foot.  Pain along the plantar aspect of fifth metatarsal both feet.  Definite limb length discrepancy noted.        Assessment & Plan:  Foot Sprain B/L   Arthritis right foot.  IE.  Xray.  Powersteps were recommended.  RTC 2 weeks.

## 2014-11-10 ENCOUNTER — Ambulatory Visit (INDEPENDENT_AMBULATORY_CARE_PROVIDER_SITE_OTHER): Payer: Medicare Other | Admitting: Podiatry

## 2014-11-10 ENCOUNTER — Encounter: Payer: Self-pay | Admitting: Podiatry

## 2014-11-10 DIAGNOSIS — S93609D Unspecified sprain of unspecified foot, subsequent encounter: Secondary | ICD-10-CM | POA: Diagnosis not present

## 2014-11-10 NOTE — Progress Notes (Signed)
Subjective:     Patient ID: Tina Jenkins, female   DOB: Jan 12, 1951, 64 y.o.   MRN: [id number]  HPIThis patient presents to the office for foot sprain/cuboid syndrome both feet.  She was treated with insoles and her left foot has completely resolved.  Her right foot has improved as pain on the bottom of her foot has improved.  She says the pain now is localized and points to the cuboid/metabase right foot.  Patient says the pain completely improves wearing her insoles but pain worsens after wearing her house shoes. Her pain is at seven on top right foot.   Review of Systems     Objective:   Physical Exam GENERAL APPEARANCE: Alert, conversant. Appropriately groomed. No acute distress.  VASCULAR: Pedal pulses palpable at 2/4 DP and PT bilateral.  Capillary refill time is immediate to all digits,  Proximal to distal cooling it warm to warm.  Digital hair growth is present bilateral  NEUROLOGIC: sensation is intact epicritically and protectively to 5.07 monofilament at 5/5 sites bilateral.  Light touch is intact bilateral, vibratory sensation intact bilateral, achilles tendon reflex is intact bilateral.  MUSCULOSKELETAL: acceptable muscle strength, tone and stability bilateral.  Intrinsic muscluature intact bilateral.  Rectus appearance of foot and digits noted bilateral. No pain left foot.  Pain continues on plantar foot left upon palpaion under fifth metatarsal.  There is red and swollen and painful area at cuboid/metatarsal  DERMATOLOGIC: skin color, texture, and turgor are within normal limits.  No preulcerative lesions or ulcers  are seen, no interdigital maceration noted.  No open lesions present.  Digital nails are asymptomatic. No drainage noted.      Assessment:     Foot sprain right foot     Plan:     RIV  Injection therapy right foot,.  Told to only wear insoles in shoes.

## 2014-11-24 ENCOUNTER — Encounter: Payer: Self-pay | Admitting: Podiatry

## 2014-11-24 ENCOUNTER — Ambulatory Visit: Payer: Medicare Other

## 2014-11-24 ENCOUNTER — Ambulatory Visit (INDEPENDENT_AMBULATORY_CARE_PROVIDER_SITE_OTHER): Payer: Medicare Other | Admitting: Podiatry

## 2014-11-24 VITALS — BP 143/81 | HR 70 | Resp 14

## 2014-11-24 DIAGNOSIS — M79671 Pain in right foot: Secondary | ICD-10-CM

## 2014-11-24 DIAGNOSIS — M79672 Pain in left foot: Secondary | ICD-10-CM

## 2014-11-24 DIAGNOSIS — S93601D Unspecified sprain of right foot, subsequent encounter: Secondary | ICD-10-CM | POA: Diagnosis not present

## 2014-11-24 MED ORDER — MELOXICAM 15 MG PO TABS: 15.0000 mg | ORAL_TABLET | Freq: Every day | ORAL | Status: DC

## 2014-11-24 NOTE — Progress Notes (Addendum)
Subjective:     Patient ID: Tina Jenkins, female   DOB: [birth year], 64 y.o.   MRN: [id number]  HPIThis patient returns to the office following diagnosis and treatment for cuboid syndrome both feet.  She says the pain from the cuboid syndrome following the injection therapy has taken away her pain.  She now says she is having pain at the top inside right foot.  She says the swelling occurred this weekend and she was treated with ice. She has returned to her sandals therefore she has not worn the insoles as directed.   Review of Systems     Objective:   Physical Exam GENERAL APPEARANCE: Alert, conversant. Appropriately groomed. No acute distress.  VASCULAR: Pedal pulses palpable at  [facility] and PT bilateral.  Capillary refill time is immediate to all digits,  Normal temperature gradient.  Digital hair growth is present bilateral  NEUROLOGIC: sensation is normal to 5.07 monofilament at 5/5 sites bilateral.  Light touch is intact bilateral, Muscle strength normal.  MUSCULOSKELETAL: acceptable muscle strength, tone and stability bilateral.  Intrinsic muscluature intact bilateral.  Rectus appearance of foot and digits noted bilateral.  Pain noted at the base first metatarsal right foot with mild swelling over the first metatarsal. Mild swelling persists at the fifth metabase.  DERMATOLOGIC: skin color, texture, and turgor are within normal limits.  No preulcerative lesions or ulcers  are seen, no interdigital maceration noted.  No open lesions present.  Digital nails are asymptomatic. No drainage noted.      Assessment:     Foot Sprain right foot        Plan:     ROV  Xray.  Her foot sprain at cuboid syndrome has improved but her pain has now moved to the base first metatarsal.  X-rays reveal no pathology. Prescribed Mobic.  Told to return to insoles even in her sandals. To consider orthoses in future.

## 2014-12-17 ENCOUNTER — Encounter: Payer: Self-pay | Admitting: Podiatry

## 2014-12-17 ENCOUNTER — Ambulatory Visit (INDEPENDENT_AMBULATORY_CARE_PROVIDER_SITE_OTHER): Payer: Medicare Other | Admitting: Podiatry

## 2014-12-17 VITALS — BP 155/99 | HR 73 | Resp 16

## 2014-12-17 DIAGNOSIS — S93601D Unspecified sprain of right foot, subsequent encounter: Secondary | ICD-10-CM | POA: Diagnosis not present

## 2014-12-17 NOTE — Progress Notes (Signed)
Subjective:     Patient ID: Tina Jenkins, female   DOB: Sep 04, 1950, 64 y.o.   MRN: [id number]  HPIThis patient presents to office saying her right foot was doing well on vacation but twisted her right foot getting onto a helicopter.  She says the area then became swollen and painful.  She says the foot has improved but pain persists.  She desires evaluation and treatment.   Review of Systems     Objective:   Physical Exam GENERAL APPEARANCE: Alert, conversant. Appropriately groomed. No acute distress.  VASCULAR: Pedal pulses palpable at  [facility] and PT bilateral.  Capillary refill time is immediate to all digits,  Normal temperature gradient.  Digital hair growth is present bilateral  NEUROLOGIC: sensation is normal to 5.07 monofilament at 5/5 sites bilateral.  Light touch is intact bilateral, Muscle strength normal.  MUSCULOSKELETAL: acceptable muscle strength, tone and stability bilateral.  Intrinsic muscluature intact bilateral.  Rectus appearance of foot and digits noted bilateral. Palpable pain along peroneal tertius tendon and at base fifth metabase,  DERMATOLOGIC: skin color, texture, and turgor are within normal limits.  No preulcerative lesions or ulcers  are seen, no interdigital maceration noted.  No open lesions present.  Digital nails are asymptomatic. No drainage noted.      Assessment:     Foot Sprain  right     Plan:   ROV  Cam walker dispensed with elastic sock if found.  Discussed treatment and we decided to go with cam walker and immobilization to allow body heal itself.  RTC 3 weeks

## 2014-12-19 NOTE — Addendum Note (Signed)
Addended by: Elta Guadeloupe on: 12/19/2014 10:54 AM   Modules accepted: Orders

## 2015-01-05 ENCOUNTER — Encounter: Payer: Self-pay | Admitting: Podiatry

## 2015-01-05 ENCOUNTER — Ambulatory Visit (INDEPENDENT_AMBULATORY_CARE_PROVIDER_SITE_OTHER): Payer: Medicare Other | Admitting: Podiatry

## 2015-01-05 VITALS — BP 146/93 | HR 76 | Resp 14

## 2015-01-05 DIAGNOSIS — S93609D Unspecified sprain of unspecified foot, subsequent encounter: Secondary | ICD-10-CM | POA: Diagnosis not present

## 2015-01-05 NOTE — Progress Notes (Signed)
Subjective:     Patient ID: Tina Jenkins, female   DOB: Jul 06, 1950, 64 y.o.   MRN: [id number]  HPIPatient returns to the office saying her foot is 90% improved and she is ready to start walking.  She says the pain has resolved wearing the unna boot.  She is walking normally now in her sneakers.   Review of Systems     Objective:   Physical Exam GENERAL APPEARANCE: Alert, conversant. Appropriately groomed. No acute distress.  VASCULAR: Pedal pulses palpable at  [facility] and PT bilateral.  Capillary refill time is immediate to all digits,  Normal temperature gradient.  Digital hair growth is present bilateral  NEUROLOGIC: sensation is normal to 5.07 monofilament at 5/5 sites bilateral.  Light touch is intact bilateral, Muscle strength normal.  MUSCULOSKELETAL: acceptable muscle strength, tone and stability bilateral.  Intrinsic muscluature intact bilateral.  Rectus appearance of foot and digits noted bilateral. No palpable pain or swelling noted right foot.  DERMATOLOGIC: skin color, texture, and turgor are within normal limits.  No preulcerative lesions or ulcers  are seen, no interdigital maceration noted.  No open lesions present.  Digital nails are asymptomatic. No drainage noted.      Assessment:     Foot sprain right foot     Plan:     ROV  Told to use powersteps and elastic sock when walking.

## 2015-06-15 DIAGNOSIS — S93609D Unspecified sprain of unspecified foot, subsequent encounter: Secondary | ICD-10-CM

## 2015-09-28 ENCOUNTER — Encounter: Payer: Self-pay | Admitting: Podiatry

## 2015-09-28 ENCOUNTER — Ambulatory Visit (INDEPENDENT_AMBULATORY_CARE_PROVIDER_SITE_OTHER): Payer: Medicare Other | Admitting: Podiatry

## 2015-09-28 ENCOUNTER — Ambulatory Visit (INDEPENDENT_AMBULATORY_CARE_PROVIDER_SITE_OTHER): Payer: Medicare Other

## 2015-09-28 VITALS — BP 134/89 | HR 77 | Resp 14

## 2015-09-28 DIAGNOSIS — R52 Pain, unspecified: Secondary | ICD-10-CM

## 2015-09-28 DIAGNOSIS — M205X2 Other deformities of toe(s) (acquired), left foot: Secondary | ICD-10-CM | POA: Diagnosis not present

## 2015-09-28 DIAGNOSIS — M79671 Pain in right foot: Secondary | ICD-10-CM | POA: Diagnosis not present

## 2015-09-28 DIAGNOSIS — M2170 Unequal limb length (acquired), unspecified site: Secondary | ICD-10-CM

## 2015-09-28 MED ORDER — MELOXICAM 15 MG PO TABS: 15.0000 mg | ORAL_TABLET | Freq: Every day | ORAL | Status: DC

## 2015-09-28 NOTE — Progress Notes (Signed)
Subjective:     Patient ID: Tina Jenkins, female   DOB: 07-02-1950, 65 y.o.   MRN: [id number]  HPI this patient presents to the office with chief complaint of pain in the big toe joint of the left foot. She says she had a bunion corrected 20 years ago by Dr. Amalia Hailey. She says she now experiences pain when she works in the yard. The pain is located on the top of her big toe joints and even directly underneath the big toe joint, left foot. No evidence of any redness or swelling or limitation of motion today. She also has a history of a leg fracture which has ultimately led to her right leg being shorter. She says that when she walks. She is bent to the right side. She presents the office asking if there is any treatment for balancing her during gait   Review of Systems     Objective:   Physical Exam GENERAL APPEARANCE: Alert, conversant. Appropriately groomed. No acute distress.  VASCULAR: Pedal pulses are  palpable at  [facility] and PT bilateral.  Capillary refill time is immediate to all digits,  Normal temperature gradient.  Digital hair growth is present bilateral  NEUROLOGIC: sensation is normal to 5.07 monofilament at 5/5 sites bilateral.  Light touch is intact bilateral, Muscle strength normal.  MUSCULOSKELETAL: acceptable muscle strength, tone and stability bilateral.  Intrinsic muscluature intact bilateral.  Rectus appearance of foot and digits noted bilateral. 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  Dorsal bone noted on the head first metatarsal left foot.  Also her right leg is shorted than her left leg.  DERMATOLOGIC: skin color, texture, and turgor are within normal limits.  No preulcerative lesions or ulcers  are seen, no interdigital maceration noted.  No open lesions present.  Digital nails are asymptomatic. No drainage noted.      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  Limb length      Plan:     ROV  Xray taken left foot reveal bone spir 1st MPJ.  Added heel lift to her right  powerstride.  Prescribed Mobic to be taken prn.   Gardiner Barefoot DPM

## 2015-10-28 ENCOUNTER — Other Ambulatory Visit: Payer: Self-pay | Admitting: Podiatry

## 2016-02-04 ENCOUNTER — Telehealth: Payer: Self-pay | Admitting: *Deleted

## 2016-02-04 NOTE — Telephone Encounter (Addendum)
Pt states Dr. Prudence Davidson has given her shoe inserts to use for leg length differential, but her husband states it is not helping her. Pt would like to know if she could write her a prescription for a shoe alteration. 02/05/2016-DrPrudence Davidson states refer pt to Dr. Cannon Kettle for leg length differential measurement and treatment.  Informed pt and transferred to schedulers.

## 2016-02-18 ENCOUNTER — Encounter: Payer: Self-pay | Admitting: Sports Medicine

## 2016-02-18 ENCOUNTER — Ambulatory Visit (INDEPENDENT_AMBULATORY_CARE_PROVIDER_SITE_OTHER): Payer: Medicare Other | Admitting: Sports Medicine

## 2016-02-18 DIAGNOSIS — R269 Unspecified abnormalities of gait and mobility: Secondary | ICD-10-CM | POA: Diagnosis not present

## 2016-02-18 DIAGNOSIS — R52 Pain, unspecified: Secondary | ICD-10-CM

## 2016-02-18 DIAGNOSIS — M2170 Unequal limb length (acquired), unspecified site: Secondary | ICD-10-CM | POA: Diagnosis not present

## 2016-02-18 DIAGNOSIS — M19071 Primary osteoarthritis, right ankle and foot: Secondary | ICD-10-CM | POA: Diagnosis not present
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
## 2017-03-17 ENCOUNTER — Ambulatory Visit: Payer: Medicare Other | Admitting: Sports Medicine

## 2017-03-17 ENCOUNTER — Encounter: Payer: Self-pay | Admitting: Sports Medicine

## 2017-03-17 ENCOUNTER — Ambulatory Visit (INDEPENDENT_AMBULATORY_CARE_PROVIDER_SITE_OTHER): Payer: Medicare Other

## 2017-03-17 DIAGNOSIS — M2021 Hallux rigidus, right foot: Secondary | ICD-10-CM | POA: Diagnosis not present

## 2017-03-17 DIAGNOSIS — M2022 Hallux rigidus, left foot: Secondary | ICD-10-CM

## 2017-03-17 DIAGNOSIS — M21619 Bunion of unspecified foot: Secondary | ICD-10-CM | POA: Diagnosis not present

## 2017-03-17 DIAGNOSIS — M779 Enthesopathy, unspecified: Secondary | ICD-10-CM

## 2017-03-17 DIAGNOSIS — M19079 Primary osteoarthritis, unspecified ankle and foot: Secondary | ICD-10-CM | POA: Diagnosis not present

## 2017-03-17 MED ORDER — MELOXICAM 15 MG PO TABS: 15.0000 mg | ORAL_TABLET | Freq: Every day | ORAL | 0 refills | Status: DC

## 2017-03-17 MED ORDER — METHYLPREDNISOLONE 4 MG PO TBPK: ORAL_TABLET | ORAL | 0 refills | Status: DC

## 2017-03-17 NOTE — Progress Notes (Signed)
Subjective: Tina Jenkins is a 66 y.o. female patient who presents to office for evaluation of evaluation of bilateral foot pain 1. Pain at top of right foot x 2-3 weeks after putting up christmas decorations, achy in nature with swelling worse with use 2. Pain at big toe joints on both feet, history of previous bunionectomy 3. Pain at top of left foot at area of bump. Tried biofreeze, rest,heating pad with no improvement in symptoms. Patient denies injury, sprain, trip or fall. Denies constitutional symptoms of nausea, vomiting, fever, chills.   Using orthotic and shoe lift on right which is the shorter limb.  There are no active problems to display for this patient.  Current Outpatient Medications on File Prior to Visit  Medication Sig Dispense Refill  . aspirin EC 81 MG tablet Take 81 mg by mouth daily.    Marland Kitchen atorvastatin (LIPITOR) 20 MG tablet Take 20 mg by mouth at bedtime.     . Calcium Carbonate-Vit D-Min (CALCIUM 1200 PO) Take 1,200 mg by mouth 2 (two) times daily.    Marland Kitchen glucosamine-chondroitin 500-400 MG tablet Take 1 tablet by mouth 2 (two) times daily.    . hydrochlorothiazide (HYDRODIURIL) 25 MG tablet Take 25 mg by mouth at bedtime.     . Multiple Vitamin (MULTIVITAMIN WITH MINERALS) TABS Take 1 tablet by mouth daily.    Marland Kitchen HYDROcodone-acetaminophen (NORCO/VICODIN) 5-325 MG per tablet Take 1 tablet by mouth every 6 (six) hours as needed for pain. (Patient not taking: Reported on 03/17/2017) 15 tablet 0  . naproxen sodium (ANAPROX) 220 MG tablet Take 220 mg by mouth 2 (two) times daily as needed (for pain).    . pseudoephedrine (SUDAFED) 30 MG tablet Take 30 mg by mouth every 4 (four) hours as needed for congestion.     No current facility-administered medications on file prior to visit.    No Known Allergies   Objective:  General: Alert and oriented x3 in no acute distress  Dermatology: No open lesions bilateral lower extremities, no webspace macerations, no ecchymosis bilateral,  all nails x 10 are well manicured.Old surgical scars well-healed bilateral.  Vascular: Dorsalis Pedis and Posterior Tibial pedal pulses 2/4, Capillary Fill Time 3 seconds, (+) pedal hair growth bilateral, no edema bilateral lower extremities, Temperature gradient within normal limits.  Neurology: Johney Maine sensation intact via light touch bilateral.   Musculoskeletal: There is tenderness at extensor tendons on the right foot at 3-4 toes. Mild tenderness to palpation bilateral 1st MTPJs with sprus and dorsal left midfoot at area of bone spur, there is decreased ankle rom with knee extending  vs flexed resembling gastroc equnius bilateral, decreaased midtarsal and first metatarsophalangeal joint range of motion. Decreased right greater than left with functional limitus now some components of rigidus noted with dorsal bunion.   Limb length difference of 1 inch as measured from ASIS to medial malleolus with the left leg being the longer leg.   Assessment and Plan: Problem List Items Addressed This Visit    None    Visit Diagnoses    Hallux rigidus of both feet    -  Primary   Bunion       Relevant Orders   DG Foot Complete Left   Capsulitis       Relevant Orders   DG Foot Complete Right   Arthritis of foot       Relevant Medications   methylPREDNISolone (MEDROL DOSEPAK) 4 MG TBPK tablet   meloxicam (MOBIC) 15 MG tablet  Tendonitis       Relevant Medications   methylPREDNISolone (MEDROL DOSEPAK) 4 MG TBPK tablet   meloxicam (MOBIC) 15 MG tablet      -Complete examination performed -Xrays reviewed  -Discussed treatement options for tendonitis on right and bilateral arthritis with  limb length discrepancy -Rx Medrol dose pak and mobic to take as instructed -Recommend rest, ice, elevation and compression sleeve to right, surgitube dispensed -Recommend topical creams and rub as needed for pain -Laced left shoe to box out problem area  -Recommend continue with good supportive  shoes/orthotics/shoe lift on right -Patient to return to office in 1 months for f/u on tendonitis and arthritis or sooner if condition worsens.  Landis Martins, DPM

## 2017-04-12 ENCOUNTER — Other Ambulatory Visit: Payer: Self-pay | Admitting: Sports Medicine

## 2017-04-12 DIAGNOSIS — M779 Enthesopathy, unspecified: Secondary | ICD-10-CM

## 2017-04-12 DIAGNOSIS — M19079 Primary osteoarthritis, unspecified ankle and foot: Secondary | ICD-10-CM

## 2017-04-21 ENCOUNTER — Encounter: Payer: Self-pay | Admitting: Sports Medicine

## 2017-04-21 ENCOUNTER — Ambulatory Visit: Payer: Medicare Other | Admitting: Sports Medicine

## 2017-04-21 DIAGNOSIS — M19079 Primary osteoarthritis, unspecified ankle and foot: Secondary | ICD-10-CM | POA: Diagnosis not present

## 2017-04-21 DIAGNOSIS — M21619 Bunion of unspecified foot: Secondary | ICD-10-CM | POA: Diagnosis not present

## 2017-04-21 DIAGNOSIS — M779 Enthesopathy, unspecified: Secondary | ICD-10-CM | POA: Diagnosis not present

## 2017-04-21 DIAGNOSIS — M2022 Hallux rigidus, left foot: Secondary | ICD-10-CM

## 2017-04-21 DIAGNOSIS — M2021 Hallux rigidus, right foot: Secondary | ICD-10-CM

## 2017-04-21 NOTE — Progress Notes (Signed)
Subjective: Tina Jenkins is a 67 y.o. female patient who returns to office for evaluation of evaluation of bilateral foot pain 1. Pain at top of right foot is doing much better without any issues states that she does have some pain at her ankle that is described as stiffness when she gets up in the morning. 2.  Pain at the left big toe joint at the area of the bump is doing okay as long as she prevents rubbing and irritation at the site and is wise about the shoes that she decides to wear.  Patient states that she could not take the meloxicam that I gave her because her urologist was afraid that she may get kidney stones because she was dealing with a bladder infection.  Patient denies constitutional symptoms of nausea, vomiting, fever, chills or any other pedal complaints at this time.  Using orthotic and shoe lift on right which is the shorter limb.  There are no active problems to display for this patient.  Current Outpatient Medications on File Prior to Visit  Medication Sig Dispense Refill  . aspirin EC 81 MG tablet Take 81 mg by mouth daily.    Marland Kitchen atorvastatin (LIPITOR) 20 MG tablet Take 20 mg by mouth at bedtime.     . Calcium Carbonate-Vit D-Min (CALCIUM 1200 PO) Take 1,200 mg by mouth 2 (two) times daily.    Marland Kitchen glucosamine-chondroitin 500-400 MG tablet Take 1 tablet by mouth 2 (two) times daily.    . hydrochlorothiazide (HYDRODIURIL) 25 MG tablet Take 25 mg by mouth at bedtime.     . Multiple Vitamin (MULTIVITAMIN WITH MINERALS) TABS Take 1 tablet by mouth daily.    Marland Kitchen HYDROcodone-acetaminophen (NORCO/VICODIN) 5-325 MG per tablet Take 1 tablet by mouth every 6 (six) hours as needed for pain. (Patient not taking: Reported on 03/17/2017) 15 tablet 0  . meloxicam (MOBIC) 15 MG tablet Take 1 tablet (15 mg total) by mouth daily. (Patient not taking: Reported on 04/21/2017) 30 tablet 0  . methylPREDNISolone (MEDROL DOSEPAK) 4 MG TBPK tablet Take 1st as instructed (Patient not taking: Reported on  04/21/2017) 21 tablet 0  . naproxen sodium (ANAPROX) 220 MG tablet Take 220 mg by mouth 2 (two) times daily as needed (for pain).    . pseudoephedrine (SUDAFED) 30 MG tablet Take 30 mg by mouth every 4 (four) hours as needed for congestion.     No current facility-administered medications on file prior to visit.    No Known Allergies   Objective:  General: Alert and oriented x3 in no acute distress  Dermatology: No open lesions bilateral lower extremities, no webspace macerations, no ecchymosis bilateral, all nails x 10 are well manicured.Old surgical scars well-healed bilateral.  Vascular: Dorsalis Pedis and Posterior Tibial pedal pulses 2/4, Capillary Fill Time 3 seconds, (+) pedal hair growth bilateral, no edema bilateral lower extremities, Temperature gradient within normal limits.  Neurology: Johney Maine sensation intact via light touch bilateral.   Musculoskeletal: There is no tenderness at extensor tendons on the right foot at 3-4 toes. Mild tenderness to palpation bilateral 1st MTPJs left greater than right with sprus and dorsal left midfoot at area of bone spur, there is decreased ankle rom with knee extending  vs flexed resembling gastroc equnius bilateral with mild stiffness subjectively noted on the right ankle, decreased midtarsal and first metatarsophalangeal joint range of motion. Decreased right greater than left with functional limitus now some components of rigidus noted with dorsal bunion.   Previous limb length difference  of 1 inch with the left leg being the longer leg   Assessment and Plan: Problem List Items Addressed This Visit    None    Visit Diagnoses    Bunion    -  Primary   Capsulitis       Arthritis of foot       Tendonitis       Hallux rigidus of both feet         -Complete examination performed -Discussed treatement options for tendonitis and bilateral arthritis with limb length discrepancy -Advised patient to continue with range of motion exercises as  instructed -Advised patient that she may look into homeopathic ways to treat arthritis like tumor ache and more information on arthritis foundation.org website -Recommend topical creams and rub as needed for pain -Dispense tube foam to use at left bone spur at big toe joint to prevent rubbing when in shoes -Recommend continue with good supportive shoes/orthotics/shoe lift on right -Patient to return to office as needed or sooner if condition worsens.  Landis Martins, DPM

## 2017-05-05 ENCOUNTER — Other Ambulatory Visit: Payer: Self-pay | Admitting: Sports Medicine

## 2017-05-05 DIAGNOSIS — M2021 Hallux rigidus, right foot: Secondary | ICD-10-CM

## 2017-05-05 DIAGNOSIS — M2022 Hallux rigidus, left foot: Secondary | ICD-10-CM

## 2017-05-05 DIAGNOSIS — M21619 Bunion of unspecified foot: Secondary | ICD-10-CM

## 2018-05-03 ENCOUNTER — Telehealth: Payer: Self-pay | Admitting: *Deleted

## 2018-05-03 DIAGNOSIS — M779 Enthesopathy, unspecified: Secondary | ICD-10-CM

## 2018-05-03 DIAGNOSIS — M19079 Primary osteoarthritis, unspecified ankle and foot: Secondary | ICD-10-CM

## 2018-05-03 MED ORDER — MELOXICAM 15 MG PO TABS: 15.0000 mg | ORAL_TABLET | Freq: Every day | ORAL | 0 refills | Status: DC

## 2018-05-03 NOTE — Telephone Encounter (Signed)
Pt called for a refill of the Meloxicam. I reviewed the clinicals, pt's LOV 04/22/2027. I informed pt I felt she would benefit from an appt if she was still having problems, and would refill the meloxicam for 30 days to get to the appt time. Pt states understanding and I transferred to scheduler.

## 2018-05-18 ENCOUNTER — Encounter: Payer: Self-pay | Admitting: Sports Medicine

## 2018-05-18 ENCOUNTER — Ambulatory Visit: Payer: Medicare Other | Admitting: Sports Medicine

## 2018-05-18 ENCOUNTER — Ambulatory Visit (INDEPENDENT_AMBULATORY_CARE_PROVIDER_SITE_OTHER): Payer: Medicare Other

## 2018-05-18 DIAGNOSIS — M21619 Bunion of unspecified foot: Secondary | ICD-10-CM

## 2018-05-18 DIAGNOSIS — M779 Enthesopathy, unspecified: Secondary | ICD-10-CM

## 2018-05-18 DIAGNOSIS — M19079 Primary osteoarthritis, unspecified ankle and foot: Secondary | ICD-10-CM | POA: Diagnosis not present

## 2018-05-18 DIAGNOSIS — M79671 Pain in right foot: Secondary | ICD-10-CM

## 2018-05-18 MED ORDER — MELOXICAM 15 MG PO TABS: 15.0000 mg | ORAL_TABLET | Freq: Every day | ORAL | 4 refills | Status: DC

## 2018-05-18 NOTE — Progress Notes (Signed)
Subjective: Tina Jenkins is a 68 y.o. female patient who returns to office for follow up evaluation of foot pain on right patient reports that her meloxicam has helped with her pain a lot states that sometimes at bedtime over the last 2 weeks she has had sharp pain and dull achy pain to the dorsal aspect of her right foot states that her Biofreeze and icing helps however would like a refill on her meloxicam at this time.  Patient denies any redness warmth swelling drainage or any other acute symptoms at this time.   There are no active problems to display for this patient.  Current Outpatient Medications on File Prior to Visit  Medication Sig Dispense Refill  . aspirin EC 81 MG tablet Take 81 mg by mouth daily.    Marland Kitchen atorvastatin (LIPITOR) 20 MG tablet Take 20 mg by mouth at bedtime.     . Calcium Carbonate-Vit D-Min (CALCIUM 1200 PO) Take 1,200 mg by mouth 2 (two) times daily.    Marland Kitchen glucosamine-chondroitin 500-400 MG tablet Take 1 tablet by mouth 2 (two) times daily.    . hydrochlorothiazide (HYDRODIURIL) 25 MG tablet Take 25 mg by mouth at bedtime.     Marland Kitchen HYDROcodone-acetaminophen (NORCO/VICODIN) 5-325 MG per tablet Take 1 tablet by mouth every 6 (six) hours as needed for pain. (Patient not taking: Reported on 03/17/2017) 15 tablet 0  . meloxicam (MOBIC) 15 MG tablet Take 1 tablet (15 mg total) by mouth daily. 30 tablet 0  . methylPREDNISolone (MEDROL DOSEPAK) 4 MG TBPK tablet Take 1st as instructed (Patient not taking: Reported on 04/21/2017) 21 tablet 0  . Multiple Vitamin (MULTIVITAMIN WITH MINERALS) TABS Take 1 tablet by mouth daily.    . naproxen sodium (ANAPROX) 220 MG tablet Take 220 mg by mouth 2 (two) times daily as needed (for pain).    . pseudoephedrine (SUDAFED) 30 MG tablet Take 30 mg by mouth every 4 (four) hours as needed for congestion.     No current facility-administered medications on file prior to visit.    No Known Allergies   Objective:  General: Alert and oriented x3  in no acute distress  Dermatology: No open lesions bilateral lower extremities, no webspace macerations, no ecchymosis bilateral, all nails x 10 are well manicured.Old surgical scars well-healed bilateral.  Vascular: Dorsalis Pedis and Posterior Tibial pedal pulses 2/4, Capillary Fill Time 3 seconds, (+) pedal hair growth bilateral, no edema bilateral lower extremities, Temperature gradient within normal limits.  Neurology: Johney Maine sensation intact via light touch bilateral.   Musculoskeletal: Mild tenderness to palpation bilateral 1st MTPJs left greater than right with sprus and dorsal right midfoot at area of bone spur, there is decreased ankle rom with knee extending  vs flexed resembling gastroc equnius bilateral with mild stiffness subjectively noted on the right ankle, decreased midtarsal and first metatarsophalangeal joint range of motion. Decreased right greater than left 1st MTPJ ROM with dorsal bunion consistent with hallux rigidus.   Previous limb length difference of 1 inch with the left leg being the longer leg   X-rays consistent with joint space narrowing and diffuse arthritis, hardware intact at first metatarsal from previous bunionectomy, midtarsal breech supportive of pes planus, mild residual bunion deformity, no other acute findings.  Assessment and Plan: Problem List Items Addressed This Visit    None    Visit Diagnoses    Right foot pain    -  Primary   Relevant Orders   DG Foot Complete Right  Capsulitis       Arthritis of foot       Relevant Orders   DG Foot Complete Right   Bunion       Tendonitis         -Complete examination performed -Discussed treatement options for foot pain on right secondary to arthritis -Patient declines steroid injection at this time -Refill meloxicam to take as instructed -Continue with topical pain creams and rubs and Tumeric for inflammation -Recommend continue with good supportive shoes/orthotics/shoe lift on right -Patient to  return to office as needed or sooner if condition worsens.  Landis Martins, DPM

## 2018-05-22 ENCOUNTER — Other Ambulatory Visit: Payer: Self-pay | Admitting: Sports Medicine

## 2018-05-22 DIAGNOSIS — M779 Enthesopathy, unspecified: Secondary | ICD-10-CM

## 2018-05-22 DIAGNOSIS — M21619 Bunion of unspecified foot: Secondary | ICD-10-CM

## 2018-05-22 DIAGNOSIS — M19079 Primary osteoarthritis, unspecified ankle and foot: Secondary | ICD-10-CM

## 2018-05-22 DIAGNOSIS — M79671 Pain in right foot: Secondary | ICD-10-CM

## 2020-08-27 ENCOUNTER — Encounter: Payer: Self-pay | Admitting: Sports Medicine

## 2020-08-27 ENCOUNTER — Ambulatory Visit: Payer: Medicare Other | Admitting: Sports Medicine

## 2020-08-27 ENCOUNTER — Other Ambulatory Visit: Payer: Self-pay

## 2020-08-27 ENCOUNTER — Ambulatory Visit (INDEPENDENT_AMBULATORY_CARE_PROVIDER_SITE_OTHER): Payer: Medicare Other

## 2020-08-27 DIAGNOSIS — M79675 Pain in left toe(s): Secondary | ICD-10-CM | POA: Diagnosis not present

## 2020-08-27 DIAGNOSIS — M779 Enthesopathy, unspecified: Secondary | ICD-10-CM | POA: Diagnosis not present

## 2020-08-27 DIAGNOSIS — M2022 Hallux rigidus, left foot: Secondary | ICD-10-CM | POA: Diagnosis not present

## 2020-08-27 DIAGNOSIS — M21619 Bunion of unspecified foot: Secondary | ICD-10-CM

## 2020-08-27 NOTE — Progress Notes (Signed)
Subjective: Tina Jenkins is a 70 y.o. female patient who presents to office for evaluation of left big toe joint pain.  Patient reports that previous right big toe joint has resolved now she is having difficulty with certain activities and with shoes that rub over the large bump on the left big toe joint.  Patient has tried over-the-counter anti-inflammatories, pain, changing up shoes, and soaking without complete relief.  Patient wants to discuss surgery options and is assisted today in office by her husband.  Review of systems noncontributory.  There are no problems to display for this patient.   Current Outpatient Medications on File Prior to Visit  Medication Sig Dispense Refill  . aspirin EC 81 MG tablet Take 81 mg by mouth daily.    Marland Kitchen atorvastatin (LIPITOR) 20 MG tablet Take 20 mg by mouth at bedtime.     . benazepril-hydrochlorthiazide (LOTENSIN HCT) 20-12.5 MG tablet Take 1 tablet by mouth every morning.    . Calcium Carbonate-Vit D-Min (CALCIUM 1200 PO) Take 1,200 mg by mouth 2 (two) times daily.    Marland Kitchen glucosamine-chondroitin 500-400 MG tablet Take 1 tablet by mouth 2 (two) times daily.    . hydrochlorothiazide (HYDRODIURIL) 25 MG tablet Take 25 mg by mouth at bedtime.     Marland Kitchen HYDROcodone-acetaminophen (NORCO/VICODIN) 5-325 MG per tablet Take 1 tablet by mouth every 6 (six) hours as needed for pain. 15 tablet 0  . meloxicam (MOBIC) 15 MG tablet Take 1 tablet (15 mg total) by mouth daily. 30 tablet 4  . methylPREDNISolone (MEDROL DOSEPAK) 4 MG TBPK tablet Take 1st as instructed 21 tablet 0  . Multiple Vitamin (MULTIVITAMIN WITH MINERALS) TABS Take 1 tablet by mouth daily.    . naproxen sodium (ANAPROX) 220 MG tablet Take 220 mg by mouth 2 (two) times daily as needed (for pain).    . pseudoephedrine (SUDAFED) 30 MG tablet Take 30 mg by mouth every 4 (four) hours as needed for congestion.     No current facility-administered medications on file prior to visit.    No Known  Allergies  Objective:  General: Alert and oriented x3 in no acute distress  Dermatology: No open lesions bilateral lower extremities, no webspace macerations, no ecchymosis bilateral, all nails x 10 are well manicured.  Palpable bony mass over the first MPJ dorsally consistent with bone spur and mildly prominent medial eminence noted at the right great toe joint consistent with bunion  Vascular: Dorsalis Pedis and Posterior Tibial pedal pulses 1/4, Capillary Fill Time 3 seconds, (+) pedal hair growth bilateral, no edema bilateral lower extremities, Temperature gradient within normal limits.  Neurology: Johney Maine sensation intact via light touch bilateral.  Musculoskeletal: There is approximately 5 degrees of dorsiflexion and 5 degrees of plantarflexion at the left first MPJ with significant palpable enlarged bone spur at the dorsal first MPJ on the left.  There is no crepitus with range of motion that is severely rigid on the left.  Patient had previous bunion surgery with residual bunion deformity noted on the right as well but currently asymptomatic.  Pes planus foot type.  Gait: Mildly antalgic on the left  Xrays  Left foot: Significant first MPJ joint space narrowing with dorsal bone spurs at the metatarsal head and base of the proximal phalanx there is also arthritis noted at the sesamoid complex, K wire intact at the first metatarsal shaft from previous bunion surgery.  No other acute osseous findings.      Assessment and Plan: Problem List Items  Addressed This Visit   None   Visit Diagnoses    Bunion    -  Primary   Relevant Orders   DG Foot Complete Left   Hallux rigidus of left foot       Great toe pain, left       Capsulitis           -Complete examination performed -Xrays reviewed -Discussed treatement options end-stage arthritis with bone spur -Advised patient due to the degree of her arthritis at her big toe joint she is not a candidate for Lapiplasty surgery she is better  suited for a silicone implant at the big toe joint -Patient wants to consider surgery later this year and will return to office when she is ready to move forward with surgery consult -Meanwhile Recommend continue with good supportive shoes that do not rub the toe -Continue with over-the-counter anti-inflammatories as needed -Patient to return to office for surgery consult when ready or sooner if condition worsens.  Landis Martins, DPM

## 2021-01-07 ENCOUNTER — Ambulatory Visit: Payer: Medicare Other | Admitting: Sports Medicine

## 2021-07-24 ENCOUNTER — Emergency Department (HOSPITAL_BASED_OUTPATIENT_CLINIC_OR_DEPARTMENT_OTHER): Payer: Medicare Other

## 2021-07-24 ENCOUNTER — Other Ambulatory Visit: Payer: Self-pay

## 2021-07-24 ENCOUNTER — Encounter (HOSPITAL_BASED_OUTPATIENT_CLINIC_OR_DEPARTMENT_OTHER): Payer: Self-pay

## 2021-07-24 ENCOUNTER — Inpatient Hospital Stay (HOSPITAL_BASED_OUTPATIENT_CLINIC_OR_DEPARTMENT_OTHER)
Admission: EM | Admit: 2021-07-24 | Discharge: 2021-07-28 | DRG: 286 | Disposition: A | Discharge status: Home / Self Care | Payer: Medicare Other | Attending: Internal Medicine | Admitting: Internal Medicine

## 2021-07-24 DIAGNOSIS — N182 Chronic kidney disease, stage 2 (mild): Secondary | ICD-10-CM | POA: Diagnosis present

## 2021-07-24 DIAGNOSIS — I2510 Atherosclerotic heart disease of native coronary artery without angina pectoris: Secondary | ICD-10-CM | POA: Diagnosis present

## 2021-07-24 DIAGNOSIS — I10 Essential (primary) hypertension: Secondary | ICD-10-CM

## 2021-07-24 DIAGNOSIS — I428 Other cardiomyopathies: Secondary | ICD-10-CM | POA: Diagnosis present

## 2021-07-24 DIAGNOSIS — J810 Acute pulmonary edema: Secondary | ICD-10-CM

## 2021-07-24 DIAGNOSIS — Z8249 Family history of ischemic heart disease and other diseases of the circulatory system: Secondary | ICD-10-CM | POA: Diagnosis not present

## 2021-07-24 DIAGNOSIS — Z7982 Long term (current) use of aspirin: Secondary | ICD-10-CM | POA: Diagnosis not present

## 2021-07-24 DIAGNOSIS — I509 Heart failure, unspecified: Secondary | ICD-10-CM | POA: Diagnosis not present

## 2021-07-24 DIAGNOSIS — E785 Hyperlipidemia, unspecified: Secondary | ICD-10-CM | POA: Diagnosis not present

## 2021-07-24 DIAGNOSIS — I130 Hypertensive heart and chronic kidney disease with heart failure and stage 1 through stage 4 chronic kidney disease, or unspecified chronic kidney disease: Secondary | ICD-10-CM | POA: Diagnosis present

## 2021-07-24 DIAGNOSIS — J9601 Acute respiratory failure with hypoxia: Secondary | ICD-10-CM

## 2021-07-24 DIAGNOSIS — I493 Ventricular premature depolarization: Secondary | ICD-10-CM | POA: Diagnosis present

## 2021-07-24 DIAGNOSIS — I5031 Acute diastolic (congestive) heart failure: Secondary | ICD-10-CM | POA: Diagnosis not present

## 2021-07-24 DIAGNOSIS — E7800 Pure hypercholesterolemia, unspecified: Secondary | ICD-10-CM | POA: Diagnosis present

## 2021-07-24 DIAGNOSIS — D75839 Thrombocytosis, unspecified: Secondary | ICD-10-CM | POA: Diagnosis present

## 2021-07-24 DIAGNOSIS — I5021 Acute systolic (congestive) heart failure: Secondary | ICD-10-CM | POA: Diagnosis present

## 2021-07-24 DIAGNOSIS — I5020 Unspecified systolic (congestive) heart failure: Secondary | ICD-10-CM | POA: Diagnosis present

## 2021-07-24 DIAGNOSIS — R7303 Prediabetes: Secondary | ICD-10-CM | POA: Diagnosis present

## 2021-07-24 DIAGNOSIS — Z79899 Other long term (current) drug therapy: Secondary | ICD-10-CM

## 2021-07-24 DIAGNOSIS — Z20822 Contact with and (suspected) exposure to covid-19: Secondary | ICD-10-CM | POA: Diagnosis present

## 2021-07-24 LAB — CBC
HCT: 49 % — ABNORMAL HIGH (ref 36.0–46.0)
Hemoglobin: 15.2 g/dL — ABNORMAL HIGH (ref 12.0–15.0)
MCH: 26.4 pg (ref 26.0–34.0)
MCHC: 31 g/dL (ref 30.0–36.0)
MCV: 85.2 fL (ref 80.0–100.0)
Platelets: 652 10*3/uL — ABNORMAL HIGH (ref 150–400)
RBC: 5.75 MIL/uL — ABNORMAL HIGH (ref 3.87–5.11)
RDW: 17.6 % — ABNORMAL HIGH (ref 11.5–15.5)
WBC: 5.8 10*3/uL (ref 4.0–10.5)
nRBC: 0 % (ref 0.0–0.2)

## 2021-07-24 LAB — COMPREHENSIVE METABOLIC PANEL
ALT: 21 U/L (ref 0–44)
AST: 29 U/L (ref 15–41)
Albumin: 3.9 g/dL (ref 3.5–5.0)
Alkaline Phosphatase: 82 U/L (ref 38–126)
Anion gap: 7 (ref 5–15)
BUN: 22 mg/dL (ref 8–23)
CO2: 25 mmol/L (ref 22–32)
Calcium: 9.1 mg/dL (ref 8.9–10.3)
Chloride: 109 mmol/L (ref 98–111)
Creatinine, Ser: 1.14 mg/dL — ABNORMAL HIGH (ref 0.44–1.00)
GFR, Estimated: 51 mL/min — ABNORMAL LOW (ref >60)
Glucose, Bld: 156 mg/dL — ABNORMAL HIGH (ref 70–99)
Potassium: 4.4 mmol/L (ref 3.5–5.1)
Sodium: 141 mmol/L (ref 135–145)
Total Bilirubin: 0.5 mg/dL (ref 0.3–1.2)
Total Protein: 7.5 g/dL (ref 6.5–8.1)

## 2021-07-24 LAB — CBC WITH DIFFERENTIAL/PLATELET
Abs Immature Granulocytes: 0.04 10*3/uL (ref 0.00–0.07)
Basophils Absolute: 0.1 10*3/uL (ref 0.0–0.1)
Basophils Relative: 1 %
Eosinophils Absolute: 0.4 10*3/uL (ref 0.0–0.5)
Eosinophils Relative: 6 %
HCT: 48.8 % — ABNORMAL HIGH (ref 36.0–46.0)
Hemoglobin: 14.7 g/dL (ref 12.0–15.0)
Immature Granulocytes: 1 %
Lymphocytes Relative: 37 %
Lymphs Abs: 2.9 10*3/uL (ref 0.7–4.0)
MCH: 26 pg (ref 26.0–34.0)
MCHC: 30.1 g/dL (ref 30.0–36.0)
MCV: 86.4 fL (ref 80.0–100.0)
Monocytes Absolute: 0.7 10*3/uL (ref 0.1–1.0)
Monocytes Relative: 9 %
Neutro Abs: 3.6 10*3/uL (ref 1.7–7.7)
Neutrophils Relative %: 46 %
Platelets: 852 10*3/uL — ABNORMAL HIGH (ref 150–400)
RBC: 5.65 MIL/uL — ABNORMAL HIGH (ref 3.87–5.11)
RDW: 18.3 % — ABNORMAL HIGH (ref 11.5–15.5)
WBC: 7.7 10*3/uL (ref 4.0–10.5)
nRBC: 0 % (ref 0.0–0.2)

## 2021-07-24 LAB — RESP PANEL BY RT-PCR (FLU A&B, COVID) ARPGX2
Influenza A by PCR: NEGATIVE
Influenza B by PCR: NEGATIVE
SARS Coronavirus 2 by RT PCR: NEGATIVE

## 2021-07-24 LAB — TROPONIN I (HIGH SENSITIVITY)
Troponin I (High Sensitivity): 15 ng/L (ref <18)
Troponin I (High Sensitivity): 19 ng/L — ABNORMAL HIGH (ref <18)

## 2021-07-24 LAB — CREATININE, SERUM
Creatinine, Ser: 1.1 mg/dL — ABNORMAL HIGH (ref 0.44–1.00)
GFR, Estimated: 53 mL/min — ABNORMAL LOW (ref >60)

## 2021-07-24 LAB — BRAIN NATRIURETIC PEPTIDE: B Natriuretic Peptide: 461 pg/mL — ABNORMAL HIGH (ref 0.0–100.0)

## 2021-07-24 MED ORDER — ASPIRIN EC 81 MG PO TBEC
81.0000 mg | DELAYED_RELEASE_TABLET | Freq: Every day | ORAL | Status: DC
  Administered 2021-07-24 – 2021-07-28 (×5): 81 mg via ORAL
  Filled 2021-07-24 (×5): qty 1

## 2021-07-24 MED ORDER — ATORVASTATIN CALCIUM 10 MG PO TABS
20.0000 mg | ORAL_TABLET | Freq: Every day | ORAL | Status: DC
  Administered 2021-07-24 – 2021-07-27 (×4): 20 mg via ORAL
  Filled 2021-07-24 (×7): qty 2

## 2021-07-24 MED ORDER — FUROSEMIDE 10 MG/ML IJ SOLN
40.0000 mg | Freq: Every day | INTRAMUSCULAR | Status: DC
Start: 2021-07-24 — End: 2021-07-25
  Administered 2021-07-24: 40 mg via INTRAVENOUS
  Filled 2021-07-24: qty 4

## 2021-07-24 MED ORDER — NITROGLYCERIN IN D5W 200-5 MCG/ML-% IV SOLN
0.0000 ug/min | INTRAVENOUS | Status: DC
  Administered 2021-07-24: 5 ug/min via INTRAVENOUS
  Filled 2021-07-24: qty 250

## 2021-07-24 MED ORDER — ACETAMINOPHEN 325 MG PO TABS: 650.0000 mg | ORAL_TABLET | Freq: Four times a day (QID) | ORAL | Status: DC | PRN

## 2021-07-24 MED ORDER — ACETAMINOPHEN 650 MG RE SUPP: 650.0000 mg | Freq: Four times a day (QID) | RECTAL | Status: DC | PRN

## 2021-07-24 MED ORDER — ONDANSETRON HCL 4 MG/2ML IJ SOLN: 4.0000 mg | Freq: Four times a day (QID) | INTRAMUSCULAR | Status: DC | PRN

## 2021-07-24 MED ORDER — FUROSEMIDE 10 MG/ML IJ SOLN
40.0000 mg | Freq: Once | INTRAMUSCULAR | Status: AC
  Administered 2021-07-24: 40 mg via INTRAVENOUS
  Filled 2021-07-24: qty 4

## 2021-07-24 MED ORDER — ENOXAPARIN SODIUM 40 MG/0.4ML IJ SOSY
40.0000 mg | PREFILLED_SYRINGE | INTRAMUSCULAR | Status: DC
  Administered 2021-07-24 – 2021-07-27 (×4): 40 mg via SUBCUTANEOUS
  Filled 2021-07-24 (×4): qty 0.4

## 2021-07-24 MED ORDER — GLUCOSAMINE-CHONDROITIN 500-400 MG PO TABS: 1.0000 | ORAL_TABLET | Freq: Two times a day (BID) | ORAL | Status: DC

## 2021-07-24 MED ORDER — ONDANSETRON HCL 4 MG PO TABS: 4.0000 mg | ORAL_TABLET | Freq: Four times a day (QID) | ORAL | Status: DC | PRN

## 2021-07-24 NOTE — ED Notes (Signed)
Patient removed from BiPAP at this time. Patient placed on 2 L [place] and O2 sats 97-99%. Patient denies shortness of breath at this time. BiPAP at bedside on standby and available as needed. Vital signs appropriate at this time and no distress noted.  ?

## 2021-07-24 NOTE — ED Notes (Signed)
Patient remains off BiPAP at this time on 2 L [place] - O2 sats 95%. Patient denies shortness of breath at this time. BiPAP remains on standby.  ?

## 2021-07-24 NOTE — ED Triage Notes (Signed)
Reports waking up around 0300 this morning with difficulty breathing. Crackles on auscultation. Denies medical history or supplemental o2 usage. Sats 83% on room air in triage.  ?

## 2021-07-24 NOTE — H&P (Signed)
?History and Physical  ? ? ?Tina Jenkins AJG:[id number] DOB: 08-Mar-1951 DOA: 07/24/2021 ? ?Referring MD/NP/PA: EDP ?PCP:  ?Patient coming from: [facility] ? ?Chief Complaint: Shortness of breath ? ?HPI: Tina Jenkins is a 71/F with history of hypertension, dyslipidemia, kidney stones, osteoarthritis was in her usual state of health when she noticed dyspnea on exertion off and on over the past week.  Subsequently started noticing some orthopnea, wheezing at night, symptoms worsened last night and subsequently presented to [place] ED. she denies any chest pain, noted some chest tightness under her breast in the past few days. ?-Reports having a URI few weeks ago which resolved with supportive care ?ED Course: She was noted to be hypoxic with O2 sats of 82%, in respiratory distress, placed on BiPAP, given IV Lasix and nitroglycerin gtt., BNP was 461, troponins were negative, EKG showed nonspecific changes, chest x-ray was notable for pulmonary edema ? ?Review of Systems: As per HPI otherwise 14 point review of systems negative.  ? ?Past Medical History:  ?Diagnosis Date  ? Chronic kidney disease   ? kidney stones  ? Hypercholesteremia   ? Hypertension   ? ? ?Past Surgical History:  ?Procedure Laterality Date  ? ABDOMINAL HYSTERECTOMY  1998  ? FRACTURE SURGERY  2011, 06/2010  ? left femur  ? ? ? reports that she has never smoked. She does not have any smokeless tobacco history on file. She reports that she does not drink alcohol and does not use drugs. ? ?No Known Allergies ? ?No family history on file. ? ? ?Prior to Admission medications   ?Medication Sig Start Date End Date Taking? Authorizing Provider  ?aspirin EC 81 MG tablet Take 81 mg by mouth daily.   Yes [provider]  ?atorvastatin (LIPITOR) 20 MG tablet Take 20 mg by mouth at bedtime.    Yes [provider]  ?benazepril-hydrochlorthiazide (LOTENSIN HCT) 20-12.5 MG tablet Take 1 tablet by mouth every morning.  04/13/20  Yes [provider]  ?Calcium Carbonate-Vit D-Min (CALCIUM 1200 PO) Take 1,200 mg by mouth 2 (two) times daily.   Yes [provider]  ?glucosamine-chondroitin 500-400 MG tablet Take 1 tablet by mouth 2 (two) times daily.   Yes [provider]  ?Multiple Vitamin (MULTIVITAMIN WITH MINERALS) TABS Take 1 tablet by mouth daily.   Yes [provider]  ?hydrochlorothiazide (HYDRODIURIL) 25 MG tablet Take 25 mg by mouth at bedtime.  ?Patient not taking: Reported on 07/24/2021    [provider]  ? ? ?Physical Exam: ?Vitals:  ? 07/24/21 0955 07/24/21 1100 07/24/21 1400 07/24/21 1600  ?BP: (!) 147/95 (!) 135/102 (!) 140/97 137/78  ?Pulse: 97 93 95 96  ?Resp: (!) 21 16 (!) 22   ?Temp:    98 ?F (36.7 ?C)  ?TempSrc:    Oral  ?SpO2: 98% 95% 93% 96%  ?Weight:      ?Height:      ? ? ? ? ?Constitutional: Pleasant female sitting up in bed, AAOx3, no distress ?HEENT: Positive JVD ?CVS: S1-S2, left regular rhythm ?Lungs: Fine basilar Rales ?Abdomen: Soft, nontender, bowel sounds present ?Extremities: No edema ?Skin: No rashes on exposed skin   ? ?Labs on Admission: I have personally reviewed following labs and imaging studies ? ?CBC: ?Recent Labs  ?Lab 07/24/21 ?0441  ?WBC 7.7  ?NEUTROABS 3.6  ?HGB 14.7  ?HCT 48.8*  ?MCV 86.4  ?PLT 852*  ? ?Basic Metabolic Panel: ?Recent Labs  ?Lab  07/24/21 ?0441  ?NA 141  ?K 4.4  ?CL 109  ?CO2 25  ?GLUCOSE 156*  ?BUN 22  ?CREATININE 1.14*  ?CALCIUM 9.1  ? ?GFR: ?Estimated Creatinine Clearance: 37.4 mL/min (A) (by C-G formula based on SCr of 1.14 mg/dL (H)). ?Liver Function Tests: ?Recent Labs  ?Lab 07/24/21 ?0441  ?AST 29  ?ALT 21  ?ALKPHOS 82  ?BILITOT 0.5  ?PROT 7.5  ?ALBUMIN 3.9  ? ?No results for input(s): LIPASE, AMYLASE in the last 168 hours. ?No results for input(s): AMMONIA in the last 168 hours. ?Coagulation Profile: ?No results for input(s): INR, PROTIME in the last 168 hours. ?Cardiac Enzymes: ?No results for input(s): CKTOTAL, CKMB,  CKMBINDEX, TROPONINI in the last 168 hours. ?BNP (last 3 results) ?No results for input(s): PROBNP in the last 8760 hours. ?HbA1C: ?No results for input(s): HGBA1C in the last 72 hours. ?CBG: ?No results for input(s): GLUCAP in the last 168 hours. ?Lipid Profile: ?No results for input(s): CHOL, HDL, LDLCALC, TRIG, CHOLHDL, LDLDIRECT in the last 72 hours. ?Thyroid Function Tests: ?No results for input(s): TSH, T4TOTAL, FREET4, T3FREE, THYROIDAB in the last 72 hours. ?Anemia Panel: ?No results for input(s): VITAMINB12, FOLATE, FERRITIN, TIBC, IRON, RETICCTPCT in the last 72 hours. ?Urine analysis: ?   ?Component Value Date/Time  ? [place] YELLOW 08/26/2012 2003  ? APPEARANCEUR CLOUDY (A) 08/26/2012 2003  ? LABSPEC 1.013 08/26/2012 2003  ? PHURINE 6.0 08/26/2012 2003  ? [place] NEGATIVE 08/26/2012 2003  ? HGBUR MODERATE (A) 08/26/2012 2003  ? [place] NEGATIVE 08/26/2012 2003  ? [place] Stain NEGATIVE 08/26/2012 2003  ? [place] NEGATIVE 08/26/2012 2003  ? UROBILINOGEN 0.2 08/26/2012 2003  ? NITRITE POSITIVE (A) 08/26/2012 2003  ? LEUKOCYTESUR LARGE (A) 08/26/2012 2003  ? ?Sepsis Labs: ?'@LABRCNTIP'$ (procalcitonin:4,lacticidven:4) ?) ?Recent Results (from the past 240 hour(s))  ?Resp Panel by RT-PCR (Flu A&B, Covid) Nasopharyngeal Swab     Status: None  ? Collection Time: 07/24/21  4:33 AM  ? Specimen: Nasopharyngeal Swab; Nasopharyngeal(NP) swabs in vial transport medium  ?Result Value Ref Range Status  ? SARS Coronavirus 2 by RT PCR NEGATIVE NEGATIVE Final  ?  Comment: (NOTE) ?SARS-CoV-2 target nucleic acids are NOT DETECTED. ? ?The SARS-CoV-2 RNA is generally detectable in upper respiratory ?specimens during the acute phase of infection. The lowest ?concentration of SARS-CoV-2 viral copies this assay can detect is ?138 copies/mL. A negative result does not preclude SARS-Cov-2 ?infection and should not be used as the sole basis for treatment or ?other patient management decisions. A negative result may occur with   ?improper specimen collection/handling, submission of specimen other ?than nasopharyngeal swab, presence of viral mutation(s) within the ?areas targeted by this assay, and inadequate number of viral ?copies(<138 copies/mL). A negative result must be combined with ?clinical observations, patient history, and epidemiological ?information. The expected result is Negative. ? ?Fact Sheet for Patients:  ?EntrepreneurPulse.com.au ? ?Fact Sheet for Healthcare Providers:  ?IncredibleEmployment.be ? ?This test is no t yet approved or cleared by the [place] FDA and  ?has been authorized for detection and/or diagnosis of SARS-CoV-2 by ?FDA under an Emergency Use Authorization (EUA). This EUA will remain  ?in effect (meaning this test can be used) for the duration of the ?COVID-19 declaration under Section 564(b)(1) of the Act, 21 ?U.S.C.section 360bbb-3(b)(1), unless the authorization is terminated  ?or revoked sooner.  ? ? ?  ? Influenza A by PCR NEGATIVE NEGATIVE Final  ? Influenza B by PCR NEGATIVE NEGATIVE Final  ?  Comment: (NOTE) ?The Xpert Xpress SARS-CoV-2/FLU/RSV  plus assay is intended as an aid ?in the diagnosis of influenza from Nasopharyngeal swab specimens and ?should not be used as a sole basis for treatment. Nasal washings and ?aspirates are unacceptable for Xpert Xpress SARS-CoV-2/FLU/RSV ?testing. ? ?Fact Sheet for Patients: ?EntrepreneurPulse.com.au ? ?Fact Sheet for Healthcare Providers: ?IncredibleEmployment.be ? ?This test is not yet approved or cleared by the [place] FDA and ?has been authorized for detection and/or diagnosis of SARS-CoV-2 by ?FDA under an Emergency Use Authorization (EUA). This EUA will remain ?in effect (meaning this test can be used) for the duration of the ?COVID-19 declaration under Section 564(b)(1) of the Act, 21 U.S.C. ?section 360bbb-3(b)(1), unless the authorization is terminated  or ?revoked. ? ?Performed at [facility], [address], [place] [address] ?  ?  ? ?Radiological Exams on Admission: ?DG Chest Portable 1 View ? ?Result Date: 07/24/2021 ?CLINICAL DATA:  71 year old female

## 2021-07-24 NOTE — ED Provider Notes (Signed)
?[facility] EMERGENCY DEPARTMENT ?Provider Note ? ? ?CSN: [id number] ?Arrival date & time: 07/24/21  0415 ? ?  ? ?History ? ?Chief Complaint  ?Patient presents with  ? Shortness of Breath  ? ? ?Tina Jenkins is a 71 y.o. female. ? ?Level 5 caveat for acuity of condition.  Patient with respiratory distress and hypoxia that woke her from sleep about 1:30 AM.  States she felt well going to bed.  Woke up suddenly not able to breathe.  Denies chest pain, cough or fever but has some coughing earlier in the day.  No leg pain or leg swelling.  No abdominal pain or vomiting.  No history of asthma or COPD.  No history of CHF. ?States family history of CHF but not her personally.  History of hypertension and high cholesterol. ?Initial O2 saturation 83% on room air ? ?The history is provided by the patient and a relative. The history is limited by the condition of the patient.  ?Shortness of Breath ? ?  ? ?Home Medications ?Prior to Admission medications   ?Medication Sig Start Date End Date Taking? Authorizing Provider  ?aspirin EC 81 MG tablet Take 81 mg by mouth daily.    [provider]  ?atorvastatin (LIPITOR) 20 MG tablet Take 20 mg by mouth at bedtime.     [provider]  ?benazepril-hydrochlorthiazide (LOTENSIN HCT) 20-12.5 MG tablet Take 1 tablet by mouth every morning. 04/13/20   [provider]  ?Calcium Carbonate-Vit D-Min (CALCIUM 1200 PO) Take 1,200 mg by mouth 2 (two) times daily.    [provider]  ?glucosamine-chondroitin 500-400 MG tablet Take 1 tablet by mouth 2 (two) times daily.    [provider]  ?hydrochlorothiazide (HYDRODIURIL) 25 MG tablet Take 25 mg by mouth at bedtime.     [provider]  ?HYDROcodone-acetaminophen (NORCO/VICODIN) 5-325 MG per tablet Take 1 tablet by mouth every 6 (six) hours as needed for pain. 08/27/12   Lawyer, Harrell Gave, PA-C  ?meloxicam (MOBIC) 15 MG tablet Take 1 tablet (15 mg total) by mouth daily. 05/18/18    Landis Martins, DPM  ?methylPREDNISolone (MEDROL DOSEPAK) 4 MG TBPK tablet Take 1st as instructed 03/17/17   Landis Martins, DPM  ?Multiple Vitamin (MULTIVITAMIN WITH MINERALS) TABS Take 1 tablet by mouth daily.    [provider]  ?naproxen sodium (ANAPROX) 220 MG tablet Take 220 mg by mouth 2 (two) times daily as needed (for pain).    [provider]  ?pseudoephedrine (SUDAFED) 30 MG tablet Take 30 mg by mouth every 4 (four) hours as needed for congestion.    [provider]  ?   ? ?Allergies    ?Patient has no known allergies.   ? ?Review of Systems   ?Review of Systems  ?Unable to perform ROS: Severe respiratory distress  ?Respiratory:  Positive for shortness of breath.   ? ?Physical Exam ?Updated Vital Signs ?BP (!) 170/99   Pulse (!) 54   Temp 98.3 ?F (36.8 ?C) (Oral)   Resp (!) 30   Ht '4\' 11"'$  (1.499 m)   Wt 68 kg   SpO2 (S) (!) 83%   BMI 30.30 kg/m?  ?Physical Exam ?Vitals and nursing note reviewed.  ?Constitutional:   ?   General: She is in acute distress.  ?   Appearance: She is well-developed. She is ill-appearing.  ?   Comments: Respiratory distress with tachypnea and hypoxia  ?HENT:  ?   Head: Normocephalic and atraumatic.  ?  Mouth/Throat:  ?   Pharynx: No oropharyngeal exudate.  ?Eyes:  ?   Conjunctiva/sclera: Conjunctivae normal.  ?   Pupils: Pupils are equal, round, and reactive to light.  ?Neck:  ?   Comments: No meningismus. ?Cardiovascular:  ?   Rate and Rhythm: Normal rate and regular rhythm.  ?   Heart sounds: Normal heart sounds. No murmur heard. ?Pulmonary:  ?   Effort: Respiratory distress present.  ?   Breath sounds: Rhonchi and rales present.  ?Abdominal:  ?   Palpations: Abdomen is soft.  ?   Tenderness: There is no abdominal tenderness. There is no guarding or rebound.  ?Musculoskeletal:     ?   General: No tenderness. Normal range of motion.  ?   Cervical back: Normal range of motion and neck supple.  ?   Right lower leg: Edema present.  ?   Left lower  leg: Edema present.  ?Skin: ?   General: Skin is warm.  ?Neurological:  ?   General: No focal deficit present.  ?   Mental Status: She is alert and oriented to person, place, and time. Mental status is at baseline.  ?   Cranial Nerves: No cranial nerve deficit.  ?   Motor: No abnormal muscle tone.  ?   Coordination: Coordination normal.  ?   Comments:  5/5 strength throughout. CN 2-12 intact.Equal grip strength.   ?Psychiatric:     ?   Behavior: Behavior normal.  ? ? ?ED Results / Procedures / Treatments   ?Labs ?(all labs ordered are listed, but only abnormal results are displayed) ?Labs Reviewed  ?CBC WITH DIFFERENTIAL/PLATELET - Abnormal; Notable for the following components:  ?    Result Value  ? RBC 5.65 (*)   ? HCT 48.8 (*)   ? RDW 18.3 (*)   ? Platelets 852 (*)   ? All other components within normal limits  ?COMPREHENSIVE METABOLIC PANEL - Abnormal; Notable for the following components:  ? Glucose, Bld 156 (*)   ? Creatinine, Ser 1.14 (*)   ? GFR, Estimated 51 (*)   ? All other components within normal limits  ?BRAIN NATRIURETIC PEPTIDE - Abnormal; Notable for the following components:  ? B Natriuretic Peptide 461.0 (*)   ? All other components within normal limits  ?RESP PANEL BY RT-PCR (FLU A&B, COVID) [id number]  ?TROPONIN I (HIGH SENSITIVITY)  ? ? ?EKG ?EKG Interpretation ? ?Date/Time:  Saturday July 24 2021 04:33:33 EDT ?Ventricular Rate:  119 ?PR Interval:  139 ?QRS Duration: 75 ?QT Interval:  335 ?QTC Calculation: 472 ?R Axis:   113 ?Text Interpretation: Sinus tachycardia Multiple ventricular premature complexes Probable left atrial enlargement Right axis deviation Probable anteroseptal infarct, old Rate faster Confirmed by Ezequiel Essex [phone]) on 07/24/2021 4:47:39 AM ? ?Radiology ?DG Chest Portable 1 View ? ?Result Date: 07/24/2021 ?CLINICAL DATA:  71 year old female with shortness of breath at 0300 hours today. Abnormal pulmonary auscultation. EXAM: PORTABLE CHEST 1 VIEW COMPARISON:  CT Abdomen and  Pelvis 05/24/2021. Chest radiographs 06/10/2010. FINDINGS: Portable AP upright view at 0443 hours. Lower lung volumes compared to 2012. Similar mediastinal contour, no definite cardiomegaly. Diffuse pulmonary interstitial opacity, fairly symmetric. No pneumothorax or consolidation. No obvious pleural effusion. No acute osseous abnormality identified. Negative visible bowel gas. IMPRESSION: Diffuse pulmonary interstitial opacity most suggestive of acute pulmonary edema. Extensive viral/atypical pneumonia felt less likely. Electronically Signed   By: Genevie Ann M.D.   On: 07/24/2021 04:59   ? ?Procedures ?[facility] KitchenCritical Care ?Performed  by: Ezequiel Essex, MD ?Authorized by: Ezequiel Essex, MD  ? ?Critical care provider statement:  ?  Critical care time (minutes):  45 ?  Critical care time was exclusive of:  Separately billable procedures and treating other patients ?  Critical care was necessary to treat or prevent imminent or life-threatening deterioration of the following conditions:  Respiratory failure ?  Critical care was time spent personally by me on the following activities:  Development of treatment plan with patient or surrogate, discussions with consultants, evaluation of patient's response to treatment, examination of patient, ordering and review of laboratory studies, ordering and review of radiographic studies, ordering and performing treatments and interventions, pulse oximetry, re-evaluation of patient's condition and review of old charts ?  I assumed direction of critical care for this patient from another provider in my specialty: no   ?  Care discussed with: admitting provider    ? ? ?Medications Ordered in ED ?Medications  ?furosemide (LASIX) injection 40 mg (has no administration in time range)  ?nitroGLYCERIN 50 mg in dextrose 5 % 250 mL (0.2 mg/mL) infusion (has no administration in time range)  ? ? ?ED Course/ Medical Decision Making/ A&P ?  ?                        ? ?Sudden onset shortness of  breath with hypoxia and increased work of breathing concerning for CHF exacerbation and flash pulmonary edema.  Patient placed on BiPAP on arrival.  IV access established.  She was given IV Lasix and IV nitroglyce

## 2021-07-24 NOTE — ED Notes (Signed)
Patient still on 2 L [place] at this time. MC RT made aware of patient and given report. ?

## 2021-07-25 ENCOUNTER — Inpatient Hospital Stay (HOSPITAL_COMMUNITY): Payer: Medicare Other

## 2021-07-25 DIAGNOSIS — I5021 Acute systolic (congestive) heart failure: Secondary | ICD-10-CM | POA: Diagnosis not present

## 2021-07-25 DIAGNOSIS — I5031 Acute diastolic (congestive) heart failure: Secondary | ICD-10-CM

## 2021-07-25 DIAGNOSIS — I509 Heart failure, unspecified: Secondary | ICD-10-CM | POA: Diagnosis not present

## 2021-07-25 DIAGNOSIS — I10 Essential (primary) hypertension: Secondary | ICD-10-CM | POA: Diagnosis not present

## 2021-07-25 DIAGNOSIS — E785 Hyperlipidemia, unspecified: Secondary | ICD-10-CM

## 2021-07-25 LAB — COMPREHENSIVE METABOLIC PANEL
ALT: 16 U/L (ref 0–44)
AST: 21 U/L (ref 15–41)
Albumin: 3.4 g/dL — ABNORMAL LOW (ref 3.5–5.0)
Alkaline Phosphatase: 65 U/L (ref 38–126)
Anion gap: 9 (ref 5–15)
BUN: 20 mg/dL (ref 8–23)
CO2: 26 mmol/L (ref 22–32)
Calcium: 8.7 mg/dL — ABNORMAL LOW (ref 8.9–10.3)
Chloride: 102 mmol/L (ref 98–111)
Creatinine, Ser: 1.13 mg/dL — ABNORMAL HIGH (ref 0.44–1.00)
GFR, Estimated: 52 mL/min — ABNORMAL LOW (ref >60)
Glucose, Bld: 104 mg/dL — ABNORMAL HIGH (ref 70–99)
Potassium: 3.4 mmol/L — ABNORMAL LOW (ref 3.5–5.1)
Sodium: 137 mmol/L (ref 135–145)
Total Bilirubin: 0.7 mg/dL (ref 0.3–1.2)
Total Protein: 6.5 g/dL (ref 6.5–8.1)

## 2021-07-25 LAB — CBC
HCT: 46.7 % — ABNORMAL HIGH (ref 36.0–46.0)
Hemoglobin: 14.7 g/dL (ref 12.0–15.0)
MCH: 26.6 pg (ref 26.0–34.0)
MCHC: 31.5 g/dL (ref 30.0–36.0)
MCV: 84.6 fL (ref 80.0–100.0)
Platelets: 600 K/uL — ABNORMAL HIGH (ref 150–400)
RBC: 5.52 MIL/uL — ABNORMAL HIGH (ref 3.87–5.11)
RDW: 17.2 % — ABNORMAL HIGH (ref 11.5–15.5)
WBC: 5.4 K/uL (ref 4.0–10.5)
nRBC: 0 % (ref 0.0–0.2)

## 2021-07-25 LAB — ECHOCARDIOGRAM COMPLETE
AR max vel: 1.77 cm2
AV Area VTI: 1.74 cm2
AV Area mean vel: 1.8 cm2
AV Mean grad: 3 mmHg
AV Peak grad: 5 mmHg
Ao pk vel: 1.12 m/s
Calc EF: 35 %
Height: 59 in
MV M vel: 4.93 m/s
MV Peak grad: 97.2 mmHg
Radius: 0.4 cm
S' Lateral: 3.8 cm
Single Plane A2C EF: 32.4 %
Single Plane A4C EF: 36.9 %
Weight: 2366.4 [oz_av]

## 2021-07-25 LAB — HEMOGLOBIN A1C
Hgb A1c MFr Bld: 5.9 % — ABNORMAL HIGH (ref 4.8–5.6)
Mean Plasma Glucose: 122.63 mg/dL

## 2021-07-25 LAB — TSH: TSH: 0.707 u[IU]/mL (ref 0.350–4.500)

## 2021-07-25 MED ORDER — FUROSEMIDE 10 MG/ML IJ SOLN
40.0000 mg | Freq: Two times a day (BID) | INTRAMUSCULAR | Status: DC
Start: 2021-07-25 — End: 2021-07-26
  Administered 2021-07-25 (×2): 40 mg via INTRAVENOUS
  Filled 2021-07-25 (×2): qty 4

## 2021-07-25 MED ORDER — SPIRONOLACTONE 12.5 MG HALF TABLET
12.5000 mg | ORAL_TABLET | Freq: Every day | ORAL | Status: DC
  Administered 2021-07-25 – 2021-07-28 (×4): 12.5 mg via ORAL
  Filled 2021-07-25 (×4): qty 1

## 2021-07-25 MED ORDER — POTASSIUM CHLORIDE CRYS ER 20 MEQ PO TBCR
40.0000 meq | EXTENDED_RELEASE_TABLET | Freq: Two times a day (BID) | ORAL | Status: DC
  Administered 2021-07-25 (×2): 40 meq via ORAL
  Filled 2021-07-25 (×2): qty 2

## 2021-07-25 MED ORDER — CARVEDILOL 3.125 MG PO TABS
3.1250 mg | ORAL_TABLET | Freq: Two times a day (BID) | ORAL | Status: DC
  Administered 2021-07-25 – 2021-07-28 (×6): 3.125 mg via ORAL
  Filled 2021-07-25 (×6): qty 1

## 2021-07-25 MED ORDER — POTASSIUM CHLORIDE CRYS ER 20 MEQ PO TBCR: 40.0000 meq | EXTENDED_RELEASE_TABLET | Freq: Once | ORAL | Status: DC

## 2021-07-25 NOTE — Progress Notes (Signed)
?  Echocardiogram ?2D Echocardiogram has been performed. ? ?Tina Jenkins ?07/25/2021, 1:57 PM ?

## 2021-07-25 NOTE — Plan of Care (Signed)

## 2021-07-25 NOTE — Consult Note (Signed)
?Cardiology Consultation:  ? ?Patient ID: Tina Jenkins ?MRN: [id number]; DOB: November 07, 1950 ? ?Admit date: 07/24/2021 ?Date of Consult: 07/25/2021 ? ?PCP:  Kelton Pillar, MD ?  ?[facility] Providers ?Cardiologist:  None ? ? ?Patient Profile:  ? ?Tina Jenkins is a 71 y.o. female with a hx of HTN, HLD, and kidney stones who is being seen 07/25/2021 for the evaluation of new acute systolic heart failure at the request of Broadus John. ? ?History of Present Illness:  ? ?[name] is a 71 year old female with no known cardiac history who presented to the hospital with worsening SOB and orthopnea. Specifically, the patient states that about 3 weeks ago, she developed a viral URI. Prior to this, she was doing well and was very active without issues. Following her URI, she developed progressive dyspnea on exertion and SOB when laying flat at night. She got to the point that she could not lay down as she "couldn't breathe," prompting her to go to the ER for further evaluation. ? ?In the ER,  she was hypoxic with O2 sat 82% requiring BiPAP, nitro gtt and IV lasix. Labs notable for BNP 461. Trop negative. ECG with sinus tachycardia with PVCs. CXR with pulmonary edema. She was admitted to the medicine service and was started on lasix '40mg'$  IV BID, spironolactone 12.'5mg'$  daily. TTE today showed LVEF 35-40% with global hyponiesis, RV ok, moderate LAE, mild to mod MR, RAP 43mHg. Cardiology is now consulted for newly diagnosed HFrEF. ? ?Currently, the patient states she feels much better. Breathing significantly improved. Prior to 3 weeks ago, she was healthy and active without issues.  ? ? ?Past Medical History:  ?Diagnosis Date  ? Chronic kidney disease   ? kidney stones  ? Hypercholesteremia   ? Hypertension   ? ? ?Past Surgical History:  ?Procedure Laterality Date  ? ABDOMINAL HYSTERECTOMY  1998  ? FRACTURE SURGERY  2011, 06/2010  ? left femur  ?  ? ?Home Medications:  ?Prior to Admission medications   ?Medication Sig Start Date End  Date Taking? Authorizing Provider  ?aspirin EC 81 MG tablet Take 81 mg by mouth daily.   Yes [provider]  ?atorvastatin (LIPITOR) 20 MG tablet Take 20 mg by mouth at bedtime.    Yes [provider]  ?benazepril-hydrochlorthiazide (LOTENSIN HCT) 20-12.5 MG tablet Take 1 tablet by mouth every morning. 04/13/20  Yes [provider]  ?Calcium Carbonate-Vit D-Min (CALCIUM 1200 PO) Take 1,200 mg by mouth 2 (two) times daily.   Yes [provider]  ?glucosamine-chondroitin 500-400 MG tablet Take 1 tablet by mouth 2 (two) times daily.   Yes [provider]  ?Multiple Vitamin (MULTIVITAMIN WITH MINERALS) TABS Take 1 tablet by mouth daily.   Yes [provider]  ?hydrochlorothiazide (HYDRODIURIL) 25 MG tablet Take 25 mg by mouth at bedtime.  ?Patient not taking: Reported on 07/24/2021    [provider]  ? ? ?Inpatient Medications: ?Scheduled Meds: ? aspirin EC  81 mg Oral Daily  ? atorvastatin  20 mg Oral QHS  ? enoxaparin (LOVENOX) injection  40 mg Subcutaneous Q24H  ? furosemide  40 mg Intravenous BID  ? potassium chloride  40 mEq Oral BID  ? spironolactone  12.5 mg Oral Daily  ? ?Continuous Infusions: ? ?PRN Meds: ?acetaminophen **OR** acetaminophen, ondansetron **OR** ondansetron (ZOFRAN) IV ? ?Allergies:   No Known Allergies ? ?Social History:   ?Social History  ? ?Socioeconomic History  ? Marital status: Married  ?  Spouse name: Not on file  ? Number of children: Not on file  ? Years of education: Not on file  ? Highest education level: Not on file  ?Occupational History  ? Not on file  ?Tobacco Use  ? Smoking status: Never  ? Smokeless tobacco: Not on file  ?Substance and Sexual Activity  ? Alcohol use: No  ? Drug use: No  ? Sexual activity: Not on file  ?Other Topics Concern  ? Not on file  ?Social History Narrative  ? Not on file  ? ?Social Determinants of Health  ? ?Financial Resource Strain: Not on file  ?Food Insecurity: Not on file  ?Transportation  Needs: Not on file  ?Physical Activity: Not on file  ?Stress: Not on file  ?Social Connections: Not on file  ?Intimate Partner Violence: Not on file  ?  ?Family History:   ?No family history on file.  ? ?ROS:  ?Please see the history of present illness.  ? ?All other ROS reviewed and negative.    ? ?Physical Exam/Data:  ? ?Vitals:  ? 07/24/21 2009 07/25/21 0023 07/25/21 0447 07/25/21 0745  ?BP: 107/89 120/85 128/86 116/75  ?Pulse: 100 93 81 90  ?Resp: '19 17 18 18  '$ ?Temp: 98 ?F (36.7 ?C) 98.4 ?F (36.9 ?C) 97.8 ?F (36.6 ?C) 98.1 ?F (36.7 ?C)  ?TempSrc: Oral Oral Oral Oral  ?SpO2: 96% 97% 95% 93%  ?Weight:   67.1 kg   ?Height:      ? ? ?Intake/Output Summary (Last 24 hours) at 07/25/2021 1617 ?Last data filed at 07/25/2021 1311 ?Gross per 24 hour  ?Intake 947.47 ml  ?Output 2400 ml  ?Net -1452.53 ml  ? ? ?  07/25/2021  ?  4:47 AM 07/24/2021  ?  5:40 PM 07/24/2021  ?  4:24 AM  ?Last 3 Weights  ?Weight (lbs) 147 lb 14.4 oz 148 lb 11.2 oz 150 lb  ?Weight (kg) 67.087 kg 67.45 kg 68.04 kg  ?   ?Body mass index is 29.87 kg/m?.  ?General:  Well nourished, well developed, in no acute distress ?HEENT: normal ?Neck: JVD to midneck ?Vascular: No carotid bruits; Distal pulses 2+ bilaterally ?Cardiac:  RR, no murmurs ?Lungs:  crackles at the bases bilaterally ?Abd: soft, nontender, no hepatomegaly  ?Ext: warm, 1+ pitting edema ?Musculoskeletal:  No deformities, BUE and BLE strength normal and equal ?Skin: warm and dry  ?Neuro:  CNs 2-12 intact, no focal abnormalities noted ?Psych:  Normal affect  ? ?EKG:  The EKG was personally reviewed and demonstrates:  sinus tachycardia with PVCs ?Telemetry:  Telemetry was personally reviewed and demonstrates:  SR/sinus tach with frequent PVCs, occasional bigeminy ? ?Relevant CV Studies: ?TTE 07/25/21: ? 1. Left ventricular ejection fraction, by estimation, is 35 to 40%. The  ?left ventricle has moderately decreased function. The left ventricle  ?demonstrates global hypokinesis. There is moderate  left ventricular  ?hypertrophy. Left ventricular diastolic  ?parameters are indeterminate.  ? 2. Right ventricular systolic function is normal. The right ventricular  ?size is normal. Tricuspid regurgitation signal is inadequate for assessing  ?PA pressure.  ? 3. Left atrial size was moderately dilated.  ? 4. The mitral valve is abnormal. Mild to moderate mitral valve  ?regurgitation. No evidence of mitral stenosis.  ? 5. The aortic valve has an indeterminant number of cusps. There is mild  ?calcification of the aortic valve. There is mild thickening of the aortic  ?valve. Aortic valve regurgitation is not visualized. No aortic stenosis is  ?present.  ?  6. The inferior vena cava is normal in size with greater than 50%  ?respiratory variability, suggesting right atrial pressure of 3 mmHg.  ? ?Laboratory Data: ? ?High Sensitivity Troponin:   ?Recent Labs  ?Lab 07/24/21 ?3875 07/24/21 ?6433  ?TROPONINIHS 15 19*  ?   ?Chemistry ?Recent Labs  ?Lab 07/24/21 ?0441 07/24/21 ?1746 07/25/21 ?0411  ?NA 141  --  137  ?K 4.4  --  3.4*  ?CL 109  --  102  ?CO2 25  --  26  ?GLUCOSE 156*  --  104*  ?BUN 22  --  20  ?CREATININE 1.14* 1.10* 1.13*  ?CALCIUM 9.1  --  8.7*  ?GFRNONAA 51* 53* 52*  ?ANIONGAP 7  --  9  ?  ?Recent Labs  ?Lab 07/24/21 ?0441 07/25/21 ?0411  ?PROT 7.5 6.5  ?ALBUMIN 3.9 3.4*  ?AST 29 21  ?ALT 21 16  ?ALKPHOS 82 65  ?BILITOT 0.5 0.7  ? ?Lipids No results for input(s): CHOL, TRIG, HDL, LABVLDL, LDLCALC, CHOLHDL in the last 168 hours.  ?Hematology ?Recent Labs  ?Lab 07/24/21 ?0441 07/24/21 ?1746 07/25/21 ?0411  ?WBC 7.7 5.8 5.4  ?RBC 5.65* 5.75* 5.52*  ?HGB 14.7 15.2* 14.7  ?HCT 48.8* 49.0* 46.7*  ?MCV 86.4 85.2 84.6  ?MCH 26.0 26.4 26.6  ?MCHC 30.1 31.0 31.5  ?RDW 18.3* 17.6* 17.2*  ?PLT 852* 652* 600*  ? ?Thyroid  ?Recent Labs  ?Lab 07/25/21 ?0411  ?TSH 0.707  ?  ?BNP ?Recent Labs  ?Lab 07/24/21 ?2951  ?BNP 461.0*  ?  ?DDimer No results for input(s): DDIMER in the last 168 hours. ? ? ?Radiology/Studies:  ?DG  Chest Portable 1 View ? ?Result Date: 07/24/2021 ?CLINICAL DATA:  71 year old female with shortness of breath at 0300 hours today. Abnormal pulmonary auscultation. EXAM: PORTABLE CHEST 1 VIEW COMPARISON:  CT

## 2021-07-25 NOTE — Progress Notes (Signed)
?PROGRESS NOTE ? ? ? ?Tina Jenkins  OLM:[id number] DOB: 08-10-1950 DOA: 07/24/2021 ?PCP: Kelton Pillar, MD  ?Narrative71/F with history of hypertension, dyslipidemia, kidney stones, osteoarthritis presented to the ED with dyspnea on exertion X 1 week and orthopnea for few days ?-History of URI 2 weeks prior ?ED Course:  hypoxic with O2 sats of 82%, in respiratory distress, placed on BiPAP, given IV Lasix and nitroglycerin gtt., BNP was 461, troponins were negative, EKG showed nonspecific changes, chest x-ray was notable for pulmonary edema ? ? ?Subjective: ?Breathing much better ? ?Assessment & Plan: ? ?New onset of congestive heart failure ([place]) ?Acute hypoxic respiratory failure ?-Presenting with dyspnea on exertion, orthopnea, hypoxia, required BiPAP in the ER, now off ?-Clinically improving she is 1.8 L negative ?-Echocardiogram is pending, weaned off oxygen this morning ?-Continue IV Lasix today, added Aldactone ?-Increase activity, monitor I's/O, BMP in a.m. ?  ?Essential hypertension ?-Stable, improved, Lotensin/HCTZ on hold ?  ?Dyslipidemia ?-Continue statin ?  ?Hyperglycemia ?-Hemoglobin A1c is 5.9 consistent with borderline diabetes ?  ?Mildly elevated high-sensitivity troponin ?-Secondary to CHF, clinically no evidence of ACS ?-Follow-up echo ?  ?DVT prophylaxis: Lovenox ?Code Status: Full code ?Family Communication: Spouse at bedside ?Disposition Plan: Home pending improvement in CHF, hypoxia and work-up ?  ? ?Procedures:  ? ?Antimicrobials:  ? ? ?Objective: ?Vitals:  ? 07/24/21 2009 07/25/21 0023 07/25/21 0447 07/25/21 0745  ?BP: 107/89 120/85 128/86 116/75  ?Pulse: 100 93 81 90  ?Resp: '19 17 18 18  '$ ?Temp: 98 ?F (36.7 ?C) 98.4 ?F (36.9 ?C) 97.8 ?F (36.6 ?C) 98.1 ?F (36.7 ?C)  ?TempSrc: Oral Oral Oral Oral  ?SpO2: 96% 97% 95% 93%  ?Weight:   67.1 kg   ?Height:      ? ? ?Intake/Output Summary (Last 24 hours) at 07/25/2021 1055 ?Last data filed at 07/25/2021 0848 ?Gross per 24 hour  ?Intake 710.47 ml   ?Output 1400 ml  ?Net -689.53 ml  ? ?Filed Weights  ? 07/24/21 0424 07/24/21 1740 07/25/21 0447  ?Weight: 68 kg 67.4 kg 67.1 kg  ? ? ?Examination: ? ?General exam: appears calm and comfortable, AAOx3 ?Respiratory system: Fine basilar Rales ?Cardiovascular system: S1 & S2 heard, RRR.  ?Abd: nondistended, soft and nontender.Normal bowel sounds heard. ?Central nervous system: Alert and oriented. No focal neurological deficits. ?Extremities: No edema ?Skin: No rashes ?Psychiatry: Judgement and insight appear normal. Mood & affect appropriate.  ? ? ? ?Data Reviewed:  ? ?CBC: ?Recent Labs  ?Lab 07/24/21 ?0441 07/24/21 ?1746 07/25/21 ?0411  ?WBC 7.7 5.8 5.4  ?NEUTROABS 3.6  --   --   ?HGB 14.7 15.2* 14.7  ?HCT 48.8* 49.0* 46.7*  ?MCV 86.4 85.2 84.6  ?PLT 852* 652* 600*  ? ?Basic Metabolic Panel: ?Recent Labs  ?Lab 07/24/21 ?0441 07/24/21 ?1746 07/25/21 ?0411  ?NA 141  --  137  ?K 4.4  --  3.4*  ?CL 109  --  102  ?CO2 25  --  26  ?GLUCOSE 156*  --  104*  ?BUN 22  --  20  ?CREATININE 1.14* 1.10* 1.13*  ?CALCIUM 9.1  --  8.7*  ? ?GFR: ?Estimated Creatinine Clearance: 37.5 mL/min (A) (by C-G formula based on SCr of 1.13 mg/dL (H)). ?Liver Function Tests: ?Recent Labs  ?Lab 07/24/21 ?0441 07/25/21 ?0411  ?AST 29 21  ?ALT 21 16  ?ALKPHOS 82 65  ?BILITOT 0.5 0.7  ?PROT 7.5 6.5  ?ALBUMIN 3.9 3.4*  ? ?No results for input(s): LIPASE, AMYLASE in the last  168 hours. ?No results for input(s): AMMONIA in the last 168 hours. ?Coagulation Profile: ?No results for input(s): INR, PROTIME in the last 168 hours. ?Cardiac Enzymes: ?No results for input(s): CKTOTAL, CKMB, CKMBINDEX, TROPONINI in the last 168 hours. ?BNP (last 3 results) ?No results for input(s): PROBNP in the last 8760 hours. ?HbA1C: ?Recent Labs  ?  07/25/21 ?0411  ?HGBA1C 5.9*  ? ?CBG: ?No results for input(s): GLUCAP in the last 168 hours. ?Lipid Profile: ?No results for input(s): CHOL, HDL, LDLCALC, TRIG, CHOLHDL, LDLDIRECT in the last 72 hours. ?Thyroid Function  Tests: ?Recent Labs  ?  07/25/21 ?0411  ?TSH 0.707  ? ?Anemia Panel: ?No results for input(s): VITAMINB12, FOLATE, FERRITIN, TIBC, IRON, RETICCTPCT in the last 72 hours. ?Urine analysis: ?   ?Component Value Date/Time  ? [place] YELLOW 08/26/2012 2003  ? APPEARANCEUR CLOUDY (A) 08/26/2012 2003  ? LABSPEC 1.013 08/26/2012 2003  ? PHURINE 6.0 08/26/2012 2003  ? [place] NEGATIVE 08/26/2012 2003  ? HGBUR MODERATE (A) 08/26/2012 2003  ? [place] NEGATIVE 08/26/2012 2003  ? [place] Stain NEGATIVE 08/26/2012 2003  ? [place] NEGATIVE 08/26/2012 2003  ? UROBILINOGEN 0.2 08/26/2012 2003  ? NITRITE POSITIVE (A) 08/26/2012 2003  ? LEUKOCYTESUR LARGE (A) 08/26/2012 2003  ? ?Sepsis Labs: ?'@LABRCNTIP'$ (procalcitonin:4,lacticidven:4) ? ?) ?Recent Results (from the past 240 hour(s))  ?Resp Panel by RT-PCR (Flu A&B, Covid) Nasopharyngeal Swab     Status: None  ? Collection Time: 07/24/21  4:33 AM  ? Specimen: Nasopharyngeal Swab; Nasopharyngeal(NP) swabs in vial transport medium  ?Result Value Ref Range Status  ? SARS Coronavirus 2 by RT PCR NEGATIVE NEGATIVE Final  ?  Comment: (NOTE) ?SARS-CoV-2 target nucleic acids are NOT DETECTED. ? ?The SARS-CoV-2 RNA is generally detectable in upper respiratory ?specimens during the acute phase of infection. The lowest ?concentration of SARS-CoV-2 viral copies this assay can detect is ?138 copies/mL. A negative result does not preclude SARS-Cov-2 ?infection and should not be used as the sole basis for treatment or ?other patient management decisions. A negative result may occur with  ?improper specimen collection/handling, submission of specimen other ?than nasopharyngeal swab, presence of viral mutation(s) within the ?areas targeted by this assay, and inadequate number of viral ?copies(<138 copies/mL). A negative result must be combined with ?clinical observations, patient history, and epidemiological ?information. The expected result is Negative. ? ?Fact Sheet for Patients:   ?EntrepreneurPulse.com.au ? ?Fact Sheet for Healthcare Providers:  ?IncredibleEmployment.be ? ?This test is no t yet approved or cleared by the [place] FDA and  ?has been authorized for detection and/or diagnosis of SARS-CoV-2 by ?FDA under an Emergency Use Authorization (EUA). This EUA will remain  ?in effect (meaning this test can be used) for the duration of the ?COVID-19 declaration under Section 564(b)(1) of the Act, 21 ?U.S.C.section 360bbb-3(b)(1), unless the authorization is terminated  ?or revoked sooner.  ? ? ?  ? Influenza A by PCR NEGATIVE NEGATIVE Final  ? Influenza B by PCR NEGATIVE NEGATIVE Final  ?  Comment: (NOTE) ?The Xpert Xpress SARS-CoV-2/FLU/RSV plus assay is intended as an aid ?in the diagnosis of influenza from Nasopharyngeal swab specimens and ?should not be used as a sole basis for treatment. Nasal washings and ?aspirates are unacceptable for Xpert Xpress SARS-CoV-2/FLU/RSV ?testing. ? ?Fact Sheet for Patients: ?EntrepreneurPulse.com.au ? ?Fact Sheet for Healthcare Providers: ?IncredibleEmployment.be ? ?This test is not yet approved or cleared by the [place] FDA and ?has been authorized for detection and/or diagnosis of SARS-CoV-2 by ?FDA under an Emergency Use Authorization (  EUA). This EUA will remain ?in effect (meaning this test can be used) for the duration of the ?COVID-19 declaration under Section 564(b)(1) of the Act, 21 U.S.C. ?section 360bbb-3(b)(1), unless the authorization is terminated or ?revoked. ? ?Performed at [facility], [address] [place], [place] [address] ?  ?  ? ?Radiology Studies: ?DG Chest Portable 1 View ? ?Result Date: 07/24/2021 ?CLINICAL DATA:  71 year old female with shortness of breath at 0300 hours today. Abnormal pulmonary auscultation. EXAM: PORTABLE CHEST 1 VIEW COMPARISON:  CT Abdomen and Pelvis 05/24/2021. Chest radiographs 06/10/2010. FINDINGS: Portable AP  upright view at 0443 hours. Lower lung volumes compared to 2012. Similar mediastinal contour, no definite cardiomegaly. Diffuse pulmonary interstitial opacity, fairly symmetric. No pneumothorax or consolidation. No obvious pleur

## 2021-07-25 NOTE — Plan of Care (Signed)
  Problem: Clinical Measurements: Goal: Diagnostic test results will improve Outcome: Progressing   Problem: Activity: Goal: Risk for activity intolerance will decrease Outcome: Progressing   Problem: Safety: Goal: Ability to remain free from injury will improve Outcome: Progressing   

## 2021-07-26 ENCOUNTER — Encounter (HOSPITAL_COMMUNITY): Payer: Self-pay | Admitting: Internal Medicine

## 2021-07-26 DIAGNOSIS — D75839 Thrombocytosis, unspecified: Secondary | ICD-10-CM

## 2021-07-26 DIAGNOSIS — I5021 Acute systolic (congestive) heart failure: Secondary | ICD-10-CM | POA: Diagnosis not present

## 2021-07-26 DIAGNOSIS — E785 Hyperlipidemia, unspecified: Secondary | ICD-10-CM | POA: Diagnosis not present

## 2021-07-26 DIAGNOSIS — I509 Heart failure, unspecified: Secondary | ICD-10-CM | POA: Diagnosis not present

## 2021-07-26 DIAGNOSIS — I10 Essential (primary) hypertension: Secondary | ICD-10-CM | POA: Diagnosis not present

## 2021-07-26 LAB — CBC
HCT: 47.9 % — ABNORMAL HIGH (ref 36.0–46.0)
Hemoglobin: 15.3 g/dL — ABNORMAL HIGH (ref 12.0–15.0)
MCH: 26.6 pg (ref 26.0–34.0)
MCHC: 31.9 g/dL (ref 30.0–36.0)
MCV: 83.3 fL (ref 80.0–100.0)
Platelets: 700 10*3/uL — ABNORMAL HIGH (ref 150–400)
RBC: 5.75 MIL/uL — ABNORMAL HIGH (ref 3.87–5.11)
RDW: 17.4 % — ABNORMAL HIGH (ref 11.5–15.5)
WBC: 5.3 10*3/uL (ref 4.0–10.5)
nRBC: 0 % (ref 0.0–0.2)

## 2021-07-26 LAB — BASIC METABOLIC PANEL
Anion gap: 8 (ref 5–15)
BUN: 24 mg/dL — ABNORMAL HIGH (ref 8–23)
CO2: 23 mmol/L (ref 22–32)
Calcium: 9.1 mg/dL (ref 8.9–10.3)
Chloride: 105 mmol/L (ref 98–111)
Creatinine, Ser: 1.23 mg/dL — ABNORMAL HIGH (ref 0.44–1.00)
GFR, Estimated: 47 mL/min — ABNORMAL LOW (ref >60)
Glucose, Bld: 106 mg/dL — ABNORMAL HIGH (ref 70–99)
Potassium: 4.4 mmol/L (ref 3.5–5.1)
Sodium: 136 mmol/L (ref 135–145)

## 2021-07-26 MED ORDER — SODIUM CHLORIDE 0.9% FLUSH: 3.0000 mL | INTRAVENOUS | Status: DC | PRN

## 2021-07-26 MED ORDER — FUROSEMIDE 10 MG/ML IJ SOLN: 40.0000 mg | Freq: Every day | INTRAMUSCULAR | Status: DC

## 2021-07-26 MED ORDER — SODIUM CHLORIDE 0.9 % IV SOLN: INTRAVENOUS | Status: DC

## 2021-07-26 MED ORDER — SODIUM CHLORIDE 0.9 % IV SOLN: 250.0000 mL | INTRAVENOUS | Status: DC | PRN

## 2021-07-26 MED ORDER — FUROSEMIDE 40 MG PO TABS
40.0000 mg | ORAL_TABLET | Freq: Every day | ORAL | Status: DC
  Administered 2021-07-26: 40 mg via ORAL
  Filled 2021-07-26: qty 1

## 2021-07-26 MED ORDER — SODIUM CHLORIDE 0.9% FLUSH
3.0000 mL | Freq: Two times a day (BID) | INTRAVENOUS | Status: DC
  Administered 2021-07-26: 3 mL via INTRAVENOUS

## 2021-07-26 NOTE — Progress Notes (Addendum)
?PROGRESS NOTE ? ? ? ?Tina Jenkins  GXQ:[id number] DOB: 06-12-1950 DOA: 07/24/2021 ?PCP: Kelton Pillar, MD  ?Narrative71/F with history of hypertension, dyslipidemia, kidney stones, osteoarthritis presented to the ED with dyspnea on exertion X 1 week and orthopnea for few days ?-History of URI 2 weeks prior ?ED Course:  hypoxic with O2 sats of 82%, in respiratory distress, placed on BiPAP, given IV Lasix and nitroglycerin gtt., BNP was 461, troponins were negative, EKG showed nonspecific changes, chest x-ray was notable for pulmonary edema ? ? ?Subjective: ?Breathing much better, back to baseline now ? ?Assessment & Plan: ? ?Acute systolic CHF ?Acute hypoxic respiratory failure ?-Presenting with dyspnea on exertion, orthopnea, hypoxia, required BiPAP in the ER, now off ?-Clinically improving, she is 2.8 L negative ?-2D echo noted EF of 35%, global hypokinesis, moderate LVH ?-Clinically appears euvolemic, changed to home Lasix today, continue Aldactone ?-Add SGLT2i prior to discharge ?-Left heart cath tomorrow ?  ?Essential hypertension ?-Stable, improved, Lotensin/HCTZ on hold ?  ?Dyslipidemia ?-Continue statin ?  ?Hyperglycemia ?-Hemoglobin A1c is 5.9 consistent with borderline diabetes ?  ?Mildly elevated high-sensitivity troponin ?-Secondary to CHF, clinically no evidence of ACS ? ?Chronic thrombocytosis ?- chronic thrombocytosis dating back at least 10 years, needs hematology referral to rule out P vera or other myeloproliferative disorder ?  ?DVT prophylaxis: Lovenox ?Code Status: Full code ?Family Communication: Spouse at bedside ?Disposition Plan: Home 48 hours ?  ? ?Procedures:  ? ?Antimicrobials:  ? ? ?Objective: ?Vitals:  ? 07/25/21 0745 07/25/21 1926 07/26/21 0418 07/26/21 0806  ?BP: 1'16/75 95/65 94/74 '$ 131/85  ?Pulse: 90 (!) 103 92 (!) 50  ?Resp: '18 17 19 19  '$ ?Temp: 98.1 ?F (36.7 ?C) 98.4 ?F (36.9 ?C) 98.1 ?F (36.7 ?C) 98.3 ?F (36.8 ?C)  ?TempSrc: Oral  Oral   ?SpO2: 93% 94% 95% 95%  ?Weight:   66.6 kg    ?Height:      ? ? ?Intake/Output Summary (Last 24 hours) at 07/26/2021 1203 ?Last data filed at 07/26/2021 1448 ?Gross per 24 hour  ?Intake 1075 ml  ?Output 2350 ml  ?Net -1275 ml  ? ?Filed Weights  ? 07/24/21 1740 07/25/21 0447 07/26/21 0418  ?Weight: 67.4 kg 67.1 kg 66.6 kg  ? ? ?Examination: ? ?General exam: Pleasant, no distress ?HEENT: No JVD ?CVs: S1-S2, regular rate rhythm ?Lungs: Clear today ?Abdomen: Soft, nontender, bowel sounds present ?Extremities: No edema  ?Skin: No rashes ?Psychiatry: Judgement and insight appear normal. Mood & affect appropriate.  ? ? ? ?Data Reviewed:  ? ?CBC: ?Recent Labs  ?Lab 07/24/21 ?0441 07/24/21 ?1746 07/25/21 ?0411 07/26/21 ?0225  ?WBC 7.7 5.8 5.4 5.3  ?NEUTROABS 3.6  --   --   --   ?HGB 14.7 15.2* 14.7 15.3*  ?HCT 48.8* 49.0* 46.7* 47.9*  ?MCV 86.4 85.2 84.6 83.3  ?PLT 852* 652* 600* 700*  ? ?Basic Metabolic Panel: ?Recent Labs  ?Lab 07/24/21 ?0441 07/24/21 ?1746 07/25/21 ?0411 07/26/21 ?0225  ?NA 141  --  137 136  ?K 4.4  --  3.4* 4.4  ?CL 109  --  102 105  ?CO2 25  --  26 23  ?GLUCOSE 156*  --  104* 106*  ?BUN 22  --  20 24*  ?CREATININE 1.14* 1.10* 1.13* 1.23*  ?CALCIUM 9.1  --  8.7* 9.1  ? ?GFR: ?Estimated Creatinine Clearance: 34.3 mL/min (A) (by C-G formula based on SCr of 1.23 mg/dL (H)). ?Liver Function Tests: ?Recent Labs  ?Lab 07/24/21 ?0441 07/25/21 ?0411  ?AST 29  21  ?ALT 21 16  ?ALKPHOS 82 65  ?BILITOT 0.5 0.7  ?PROT 7.5 6.5  ?ALBUMIN 3.9 3.4*  ? ?No results for input(s): LIPASE, AMYLASE in the last 168 hours. ?No results for input(s): AMMONIA in the last 168 hours. ?Coagulation Profile: ?No results for input(s): INR, PROTIME in the last 168 hours. ?Cardiac Enzymes: ?No results for input(s): CKTOTAL, CKMB, CKMBINDEX, TROPONINI in the last 168 hours. ?BNP (last 3 results) ?No results for input(s): PROBNP in the last 8760 hours. ?HbA1C: ?Recent Labs  ?  07/25/21 ?0411  ?HGBA1C 5.9*  ? ?CBG: ?No results for input(s): GLUCAP in the last 168 hours. ?Lipid  Profile: ?No results for input(s): CHOL, HDL, LDLCALC, TRIG, CHOLHDL, LDLDIRECT in the last 72 hours. ?Thyroid Function Tests: ?Recent Labs  ?  07/25/21 ?0411  ?TSH 0.707  ? ?Anemia Panel: ?No results for input(s): VITAMINB12, FOLATE, FERRITIN, TIBC, IRON, RETICCTPCT in the last 72 hours. ?Urine analysis: ?   ?Component Value Date/Time  ? [place] YELLOW 08/26/2012 2003  ? APPEARANCEUR CLOUDY (A) 08/26/2012 2003  ? LABSPEC 1.013 08/26/2012 2003  ? PHURINE 6.0 08/26/2012 2003  ? [place] NEGATIVE 08/26/2012 2003  ? HGBUR MODERATE (A) 08/26/2012 2003  ? [place] NEGATIVE 08/26/2012 2003  ? [place] Stain NEGATIVE 08/26/2012 2003  ? [place] NEGATIVE 08/26/2012 2003  ? UROBILINOGEN 0.2 08/26/2012 2003  ? NITRITE POSITIVE (A) 08/26/2012 2003  ? LEUKOCYTESUR LARGE (A) 08/26/2012 2003  ? ?Sepsis Labs: ?'@LABRCNTIP'$ (procalcitonin:4,lacticidven:4) ? ?) ?Recent Results (from the past 240 hour(s))  ?Resp Panel by RT-PCR (Flu A&B, Covid) Nasopharyngeal Swab     Status: None  ? Collection Time: 07/24/21  4:33 AM  ? Specimen: Nasopharyngeal Swab; Nasopharyngeal(NP) swabs in vial transport medium  ?Result Value Ref Range Status  ? SARS Coronavirus 2 by RT PCR NEGATIVE NEGATIVE Final  ?  Comment: (NOTE) ?SARS-CoV-2 target nucleic acids are NOT DETECTED. ? ?The SARS-CoV-2 RNA is generally detectable in upper respiratory ?specimens during the acute phase of infection. The lowest ?concentration of SARS-CoV-2 viral copies this assay can detect is ?138 copies/mL. A negative result does not preclude SARS-Cov-2 ?infection and should not be used as the sole basis for treatment or ?other patient management decisions. A negative result may occur with  ?improper specimen collection/handling, submission of specimen other ?than nasopharyngeal swab, presence of viral mutation(s) within the ?areas targeted by this assay, and inadequate number of viral ?copies(<138 copies/mL). A negative result must be combined with ?clinical observations, patient  history, and epidemiological ?information. The expected result is Negative. ? ?Fact Sheet for Patients:  ?EntrepreneurPulse.com.au ? ?Fact Sheet for Healthcare Providers:  ?IncredibleEmployment.be ? ?This test is no t yet approved or cleared by the [place] FDA and  ?has been authorized for detection and/or diagnosis of SARS-CoV-2 by ?FDA under an Emergency Use Authorization (EUA). This EUA will remain  ?in effect (meaning this test can be used) for the duration of the ?COVID-19 declaration under Section 564(b)(1) of the Act, 21 ?U.S.C.section 360bbb-3(b)(1), unless the authorization is terminated  ?or revoked sooner.  ? ? ?  ? Influenza A by PCR NEGATIVE NEGATIVE Final  ? Influenza B by PCR NEGATIVE NEGATIVE Final  ?  Comment: (NOTE) ?The Xpert Xpress SARS-CoV-2/FLU/RSV plus assay is intended as an aid ?in the diagnosis of influenza from Nasopharyngeal swab specimens and ?should not be used as a sole basis for treatment. Nasal washings and ?aspirates are unacceptable for Xpert Xpress SARS-CoV-2/FLU/RSV ?testing. ? ?Fact Sheet for Patients: ?EntrepreneurPulse.com.au ? ?Fact Sheet for Healthcare Providers: ?  IncredibleEmployment.be ? ?This test is not yet approved or cleared by the [place] FDA and ?has been authorized for detection and/or diagnosis of SARS-CoV-2 by ?FDA under an Emergency Use Authorization (EUA). This EUA will remain ?in effect (meaning this test can be used) for the duration of the ?COVID-19 declaration under Section 564(b)(1) of the Act, 21 U.S.C. ?section 360bbb-3(b)(1), unless the authorization is terminated or ?revoked. ? ?Performed at [facility], [place], [address] ?[place], [place] [address] ?  ?  ? ?Radiology Studies: ?ECHOCARDIOGRAM COMPLETE ? ?Result Date: 07/25/2021 ?   ECHOCARDIOGRAM REPORT   Patient Name:   ALAIZA YAU Date of Exam: 07/25/2021 Medical Rec #:  [id number]      Height:       59.0 in  Accession #:    [id number]     Weight:       147.9 lb Date of Birth:  08-30-1950       BSA:          1.622 m? Patient Age:    71 years       BP:           116/75 mmHg Patient Gender: F              HR:           88

## 2021-07-26 NOTE — Progress Notes (Signed)
? ?Progress Note ? ?Patient Name: Tina Jenkins ?Date of Encounter: 07/26/2021 ? ?[facility] Cardiologist: None  ? ?Subjective  ? ?Doing better this morning. Lower extremity edema resolved. No orthopnea.  ? ?Cr 1.13>1.23 ? ?Inpatient Medications  ?  ?Scheduled Meds: ? aspirin EC  81 mg Oral Daily  ? atorvastatin  20 mg Oral QHS  ? carvedilol  3.125 mg Oral BID WC  ? enoxaparin (LOVENOX) injection  40 mg Subcutaneous Q24H  ? furosemide  40 mg Intravenous BID  ? potassium chloride  40 mEq Oral BID  ? spironolactone  12.5 mg Oral Daily  ? ?Continuous Infusions: ? ?PRN Meds: ?acetaminophen **OR** acetaminophen, ondansetron **OR** ondansetron (ZOFRAN) IV  ? ?Vital Signs  ?  ?Vitals:  ? 07/25/21 0447 07/25/21 0745 07/25/21 1926 07/26/21 0418  ?BP: 128/86 1'16/75 95/65 94/74 '$  ?Pulse: 81 90 (!) 103 92  ?Resp: '18 18 17 19  '$ ?Temp: 97.8 ?F (36.6 ?C) 98.1 ?F (36.7 ?C) 98.4 ?F (36.9 ?C) 98.1 ?F (36.7 ?C)  ?TempSrc: Oral Oral  Oral  ?SpO2: 95% 93% 94% 95%  ?Weight: 67.1 kg   66.6 kg  ?Height:      ? ? ?Intake/Output Summary (Last 24 hours) at 07/26/2021 0624 ?Last data filed at 07/26/2021 0421 ?Gross per 24 hour  ?Intake 1135 ml  ?Output 2150 ml  ?Net -1015 ml  ? ? ?  07/26/2021  ?  4:18 AM 07/25/2021  ?  4:47 AM 07/24/2021  ?  5:40 PM  ?Last 3 Weights  ?Weight (lbs) 146 lb 14.4 oz 147 lb 14.4 oz 148 lb 11.2 oz  ?Weight (kg) 66.633 kg 67.087 kg 67.45 kg  ?   ? ?Telemetry  ?  ?SR/sinus tach with PVCs - Personally Reviewed ? ?ECG  ?  ?No new tracing - Personally Reviewed ? ?Physical Exam  ? ?GEN: No acute distress.   ?Neck: No JVD ?Cardiac: Tachycardic, regular. No murmurs, rubs, or gallops.  ?Respiratory: Clear to auscultation bilaterally. ?GI: Soft, nontender, non-distended  ?MS: Trace ankle edema (improved) ?Neuro:  Nonfocal  ?Psych: Normal affect  ? ?Labs  ?  ?High Sensitivity Troponin:   ?Recent Labs  ?Lab 07/24/21 ?1914 07/24/21 ?7829  ?TROPONINIHS 15 19*  ?   ?Chemistry ?Recent Labs  ?Lab 07/24/21 ?0441 07/24/21 ?1746  07/25/21 ?0411 07/26/21 ?0225  ?NA 141  --  137 136  ?K 4.4  --  3.4* 4.4  ?CL 109  --  102 105  ?CO2 25  --  26 23  ?GLUCOSE 156*  --  104* 106*  ?BUN 22  --  20 24*  ?CREATININE 1.14* 1.10* 1.13* 1.23*  ?CALCIUM 9.1  --  8.7* 9.1  ?PROT 7.5  --  6.5  --   ?ALBUMIN 3.9  --  3.4*  --   ?AST 29  --  21  --   ?ALT 21  --  16  --   ?ALKPHOS 82  --  65  --   ?BILITOT 0.5  --  0.7  --   ?GFRNONAA 51* 53* 52* 47*  ?ANIONGAP 7  --  9 8  ?  ?Lipids No results for input(s): CHOL, TRIG, HDL, LABVLDL, LDLCALC, CHOLHDL in the last 168 hours.  ?Hematology ?Recent Labs  ?Lab 07/24/21 ?1746 07/25/21 ?0411 07/26/21 ?0225  ?WBC 5.8 5.4 5.3  ?RBC 5.75* 5.52* 5.75*  ?HGB 15.2* 14.7 15.3*  ?HCT 49.0* 46.7* 47.9*  ?MCV 85.2 84.6 83.3  ?MCH 26.4 26.6 26.6  ?MCHC 31.0 31.5 31.9  ?  RDW 17.6* 17.2* 17.4*  ?PLT 652* 600* 700*  ? ?Thyroid  ?Recent Labs  ?Lab 07/25/21 ?0411  ?TSH 0.707  ?  ?BNP ?Recent Labs  ?Lab 07/24/21 ?6283  ?BNP 461.0*  ?  ?DDimer No results for input(s): DDIMER in the last 168 hours.  ? ?Radiology  ?  ?ECHOCARDIOGRAM COMPLETE ? ?Result Date: 07/25/2021 ?   ECHOCARDIOGRAM REPORT   Patient Name:   Tina Jenkins Date of Exam: 07/25/2021 Medical Rec #:  [id number]      Height:       59.0 in Accession #:    [id number]     Weight:       147.9 lb Date of Birth:  March 26, 1951       BSA:          1.622 m? Patient Age:    71 years       BP:           116/75 mmHg Patient Gender: F              HR:           88 bpm. Exam Location:  Inpatient Procedure: 2D Echo, Cardiac Doppler and Color Doppler Indications:    CHF-acute diastolic  History:        Patient has no prior history of Echocardiogram examinations.                 Signs/Symptoms:Shortness of Breath; Risk Factors:Hypertension                 and Dyslipidemia. DOE.  Sonographer:    Clayton Lefort RDCS (AE) Referring Phys: [place]  Sonographer Comments: Suboptimal subcostal window. IMPRESSIONS  1. Left ventricular ejection fraction, by estimation, is 35 to 40%. The left  ventricle has moderately decreased function. The left ventricle demonstrates global hypokinesis. There is moderate left ventricular hypertrophy. Left ventricular diastolic parameters are indeterminate.  2. Right ventricular systolic function is normal. The right ventricular size is normal. Tricuspid regurgitation signal is inadequate for assessing PA pressure.  3. Left atrial size was moderately dilated.  4. The mitral valve is abnormal. Mild to moderate mitral valve regurgitation. No evidence of mitral stenosis.  5. The aortic valve has an indeterminant number of cusps. There is mild calcification of the aortic valve. There is mild thickening of the aortic valve. Aortic valve regurgitation is not visualized. No aortic stenosis is present.  6. The inferior vena cava is normal in size with greater than 50% respiratory variability, suggesting right atrial pressure of 3 mmHg. FINDINGS  Left Ventricle: Left ventricular ejection fraction, by estimation, is 35 to 40%. The left ventricle has moderately decreased function. The left ventricle demonstrates global hypokinesis. The left ventricular internal cavity size was normal in size. There is moderate left ventricular hypertrophy. Left ventricular diastolic parameters are indeterminate. Right Ventricle: The right ventricular size is normal. No increase in right ventricular wall thickness. Right ventricular systolic function is normal. Tricuspid regurgitation signal is inadequate for assessing PA pressure. Left Atrium: Left atrial size was moderately dilated. Right Atrium: Right atrial size was normal in size. Pericardium: There is no evidence of pericardial effusion. Mitral Valve: The MR vena contracta is 0.3 cm. The mitral valve is abnormal. There is mild thickening of the mitral valve leaflet(s). There is mild calcification of the mitral valve leaflet(s). Mild mitral annular calcification. Mild to moderate mitral valve regurgitation. No evidence of mitral valve stenosis.  Tricuspid Valve: The tricuspid valve is normal in  structure. Tricuspid valve regurgitation is trivial. No evidence of tricuspid stenosis. Aortic Valve: The aortic valve has an indeterminant number of cusps. There is mild calcification of the aortic valve. There is mild thickening of the aortic valve. There is mild aortic valve annular calcification. Aortic valve regurgitation is not visualized. No aortic stenosis is present. Aortic valve mean gradient measures 3.0 mmHg. Aortic valve peak gradient measures 5.0 mmHg. Aortic valve area, by VTI measures 1.74 cm?. Pulmonic Valve: The pulmonic valve was not well visualized. Pulmonic valve regurgitation is not visualized. No evidence of pulmonic stenosis. Aorta: The aortic root is normal in size and structure. Venous: The inferior vena cava is normal in size with greater than 50% respiratory variability, suggesting right atrial pressure of 3 mmHg. IAS/Shunts: No atrial level shunt detected by color flow Doppler.  LEFT VENTRICLE PLAX 2D LVIDd:         4.60 cm LVIDs:         3.80 cm LV PW:         1.30 cm LV IVS:        1.30 cm LVOT diam:     2.20 cm LV SV:         33 LV SV Index:   20 LVOT Area:     3.80 cm?  LV Volumes (MOD) LV vol d, MOD A2C: 111.0 ml LV vol d, MOD A4C: 114.0 ml LV vol s, MOD A2C: 75.0 ml LV vol s, MOD A4C: 71.9 ml LV SV MOD A2C:     36.0 ml LV SV MOD A4C:     114.0 ml LV SV MOD BP:      40.5 ml RIGHT VENTRICLE             IVC RV Basal diam:  2.50 cm     IVC diam: 1.20 cm RV S prime:     15.40 cm/s TAPSE (M-mode): 2.0 cm LEFT ATRIUM             Index        RIGHT ATRIUM           Index LA diam:        3.30 cm 2.03 cm/m?   RA Area:     13.20 cm? LA Vol (A2C):   82.6 ml 50.91 ml/m?  RA Volume:   29.10 ml  17.94 ml/m? LA Vol (A4C):   67.3 ml 41.48 ml/m? LA Biplane Vol: 78.7 ml 48.51 ml/m?  AORTIC VALVE AV Area (Vmax):    1.77 cm? AV Area (Vmean):   1.80 cm? AV Area (VTI):     1.74 cm? AV Vmax:           112.00 cm/s AV Vmean:          79.800 cm/s AV VTI:             0.190 m AV Peak Grad:      5.0 mmHg AV Mean Grad:      3.0 mmHg LVOT Vmax:         52.10 cm/s LVOT Vmean:        37.800 cm/s LVOT VTI:          0.087 m LVOT/AV VTI ratio: 0.46  AORTA Ao Root diam: 3.10 cm

## 2021-07-26 NOTE — Consult Note (Signed)
New systolic HF. ?For left and right heart cath. ?Cr 1.23 ?BNP 461 ?

## 2021-07-26 NOTE — H&P (View-Only) (Signed)
? ?Progress Note ? ?Patient Name: Tina Jenkins ?Date of Encounter: 07/26/2021 ? ?[facility] Cardiologist: None  ? ?Subjective  ? ?Doing better this morning. Lower extremity edema resolved. No orthopnea.  ? ?Cr 1.13>1.23 ? ?Inpatient Medications  ?  ?Scheduled Meds: ? aspirin EC  81 mg Oral Daily  ? atorvastatin  20 mg Oral QHS  ? carvedilol  3.125 mg Oral BID WC  ? enoxaparin (LOVENOX) injection  40 mg Subcutaneous Q24H  ? furosemide  40 mg Intravenous BID  ? potassium chloride  40 mEq Oral BID  ? spironolactone  12.5 mg Oral Daily  ? ?Continuous Infusions: ? ?PRN Meds: ?acetaminophen **OR** acetaminophen, ondansetron **OR** ondansetron (ZOFRAN) IV  ? ?Vital Signs  ?  ?Vitals:  ? 07/25/21 0447 07/25/21 0745 07/25/21 1926 07/26/21 0418  ?BP: 128/86 1'16/75 95/65 94/74 '$  ?Pulse: 81 90 (!) 103 92  ?Resp: '18 18 17 19  '$ ?Temp: 97.8 ?F (36.6 ?C) 98.1 ?F (36.7 ?C) 98.4 ?F (36.9 ?C) 98.1 ?F (36.7 ?C)  ?TempSrc: Oral Oral  Oral  ?SpO2: 95% 93% 94% 95%  ?Weight: 67.1 kg   66.6 kg  ?Height:      ? ? ?Intake/Output Summary (Last 24 hours) at 07/26/2021 0624 ?Last data filed at 07/26/2021 0421 ?Gross per 24 hour  ?Intake 1135 ml  ?Output 2150 ml  ?Net -1015 ml  ? ? ?  07/26/2021  ?  4:18 AM 07/25/2021  ?  4:47 AM 07/24/2021  ?  5:40 PM  ?Last 3 Weights  ?Weight (lbs) 146 lb 14.4 oz 147 lb 14.4 oz 148 lb 11.2 oz  ?Weight (kg) 66.633 kg 67.087 kg 67.45 kg  ?   ? ?Telemetry  ?  ?SR/sinus tach with PVCs - Personally Reviewed ? ?ECG  ?  ?No new tracing - Personally Reviewed ? ?Physical Exam  ? ?GEN: No acute distress.   ?Neck: No JVD ?Cardiac: Tachycardic, regular. No murmurs, rubs, or gallops.  ?Respiratory: Clear to auscultation bilaterally. ?GI: Soft, nontender, non-distended  ?MS: Trace ankle edema (improved) ?Neuro:  Nonfocal  ?Psych: Normal affect  ? ?Labs  ?  ?High Sensitivity Troponin:   ?Recent Labs  ?Lab 07/24/21 ?4132 07/24/21 ?4401  ?TROPONINIHS 15 19*  ?   ?Chemistry ?Recent Labs  ?Lab 07/24/21 ?0441 07/24/21 ?1746  07/25/21 ?0411 07/26/21 ?0225  ?NA 141  --  137 136  ?K 4.4  --  3.4* 4.4  ?CL 109  --  102 105  ?CO2 25  --  26 23  ?GLUCOSE 156*  --  104* 106*  ?BUN 22  --  20 24*  ?CREATININE 1.14* 1.10* 1.13* 1.23*  ?CALCIUM 9.1  --  8.7* 9.1  ?PROT 7.5  --  6.5  --   ?ALBUMIN 3.9  --  3.4*  --   ?AST 29  --  21  --   ?ALT 21  --  16  --   ?ALKPHOS 82  --  65  --   ?BILITOT 0.5  --  0.7  --   ?GFRNONAA 51* 53* 52* 47*  ?ANIONGAP 7  --  9 8  ?  ?Lipids No results for input(s): CHOL, TRIG, HDL, LABVLDL, LDLCALC, CHOLHDL in the last 168 hours.  ?Hematology ?Recent Labs  ?Lab 07/24/21 ?1746 07/25/21 ?0411 07/26/21 ?0225  ?WBC 5.8 5.4 5.3  ?RBC 5.75* 5.52* 5.75*  ?HGB 15.2* 14.7 15.3*  ?HCT 49.0* 46.7* 47.9*  ?MCV 85.2 84.6 83.3  ?MCH 26.4 26.6 26.6  ?MCHC 31.0 31.5 31.9  ?  RDW 17.6* 17.2* 17.4*  ?PLT 652* 600* 700*  ? ?Thyroid  ?Recent Labs  ?Lab 07/25/21 ?0411  ?TSH 0.707  ?  ?BNP ?Recent Labs  ?Lab 07/24/21 ?4782  ?BNP 461.0*  ?  ?DDimer No results for input(s): DDIMER in the last 168 hours.  ? ?Radiology  ?  ?ECHOCARDIOGRAM COMPLETE ? ?Result Date: 07/25/2021 ?   ECHOCARDIOGRAM REPORT   Patient Name:   Tina Jenkins Date of Exam: 07/25/2021 Medical Rec #:  [id number]      Height:       59.0 in Accession #:    [id number]     Weight:       147.9 lb Date of Birth:  06-20-1950       BSA:          1.622 m? Patient Age:    71 years       BP:           116/75 mmHg Patient Gender: F              HR:           88 bpm. Exam Location:  Inpatient Procedure: 2D Echo, Cardiac Doppler and Color Doppler Indications:    CHF-acute diastolic  History:        Patient has no prior history of Echocardiogram examinations.                 Signs/Symptoms:Shortness of Breath; Risk Factors:Hypertension                 and Dyslipidemia. DOE.  Sonographer:    Clayton Lefort RDCS (AE) Referring Phys: [place]  Sonographer Comments: Suboptimal subcostal window. IMPRESSIONS  1. Left ventricular ejection fraction, by estimation, is 35 to 40%. The left  ventricle has moderately decreased function. The left ventricle demonstrates global hypokinesis. There is moderate left ventricular hypertrophy. Left ventricular diastolic parameters are indeterminate.  2. Right ventricular systolic function is normal. The right ventricular size is normal. Tricuspid regurgitation signal is inadequate for assessing PA pressure.  3. Left atrial size was moderately dilated.  4. The mitral valve is abnormal. Mild to moderate mitral valve regurgitation. No evidence of mitral stenosis.  5. The aortic valve has an indeterminant number of cusps. There is mild calcification of the aortic valve. There is mild thickening of the aortic valve. Aortic valve regurgitation is not visualized. No aortic stenosis is present.  6. The inferior vena cava is normal in size with greater than 50% respiratory variability, suggesting right atrial pressure of 3 mmHg. FINDINGS  Left Ventricle: Left ventricular ejection fraction, by estimation, is 35 to 40%. The left ventricle has moderately decreased function. The left ventricle demonstrates global hypokinesis. The left ventricular internal cavity size was normal in size. There is moderate left ventricular hypertrophy. Left ventricular diastolic parameters are indeterminate. Right Ventricle: The right ventricular size is normal. No increase in right ventricular wall thickness. Right ventricular systolic function is normal. Tricuspid regurgitation signal is inadequate for assessing PA pressure. Left Atrium: Left atrial size was moderately dilated. Right Atrium: Right atrial size was normal in size. Pericardium: There is no evidence of pericardial effusion. Mitral Valve: The MR vena contracta is 0.3 cm. The mitral valve is abnormal. There is mild thickening of the mitral valve leaflet(s). There is mild calcification of the mitral valve leaflet(s). Mild mitral annular calcification. Mild to moderate mitral valve regurgitation. No evidence of mitral valve stenosis.  Tricuspid Valve: The tricuspid valve is normal in  structure. Tricuspid valve regurgitation is trivial. No evidence of tricuspid stenosis. Aortic Valve: The aortic valve has an indeterminant number of cusps. There is mild calcification of the aortic valve. There is mild thickening of the aortic valve. There is mild aortic valve annular calcification. Aortic valve regurgitation is not visualized. No aortic stenosis is present. Aortic valve mean gradient measures 3.0 mmHg. Aortic valve peak gradient measures 5.0 mmHg. Aortic valve area, by VTI measures 1.74 cm?. Pulmonic Valve: The pulmonic valve was not well visualized. Pulmonic valve regurgitation is not visualized. No evidence of pulmonic stenosis. Aorta: The aortic root is normal in size and structure. Venous: The inferior vena cava is normal in size with greater than 50% respiratory variability, suggesting right atrial pressure of 3 mmHg. IAS/Shunts: No atrial level shunt detected by color flow Doppler.  LEFT VENTRICLE PLAX 2D LVIDd:         4.60 cm LVIDs:         3.80 cm LV PW:         1.30 cm LV IVS:        1.30 cm LVOT diam:     2.20 cm LV SV:         33 LV SV Index:   20 LVOT Area:     3.80 cm?  LV Volumes (MOD) LV vol d, MOD A2C: 111.0 ml LV vol d, MOD A4C: 114.0 ml LV vol s, MOD A2C: 75.0 ml LV vol s, MOD A4C: 71.9 ml LV SV MOD A2C:     36.0 ml LV SV MOD A4C:     114.0 ml LV SV MOD BP:      40.5 ml RIGHT VENTRICLE             IVC RV Basal diam:  2.50 cm     IVC diam: 1.20 cm RV S prime:     15.40 cm/s TAPSE (M-mode): 2.0 cm LEFT ATRIUM             Index        RIGHT ATRIUM           Index LA diam:        3.30 cm 2.03 cm/m?   RA Area:     13.20 cm? LA Vol (A2C):   82.6 ml 50.91 ml/m?  RA Volume:   29.10 ml  17.94 ml/m? LA Vol (A4C):   67.3 ml 41.48 ml/m? LA Biplane Vol: 78.7 ml 48.51 ml/m?  AORTIC VALVE AV Area (Vmax):    1.77 cm? AV Area (Vmean):   1.80 cm? AV Area (VTI):     1.74 cm? AV Vmax:           112.00 cm/s AV Vmean:          79.800 cm/s AV VTI:             0.190 m AV Peak Grad:      5.0 mmHg AV Mean Grad:      3.0 mmHg LVOT Vmax:         52.10 cm/s LVOT Vmean:        37.800 cm/s LVOT VTI:          0.087 m LVOT/AV VTI ratio: 0.46  AORTA Ao Root diam: 3.10 cm

## 2021-07-26 NOTE — Plan of Care (Signed)

## 2021-07-26 NOTE — Progress Notes (Signed)
Heart Failure Nurse Navigator Progress Note ? ?PCP: Kelton Pillar, MD ?PCP-Cardiologist: None ?Admission Diagnosis: Shortness of Breath.  ?Admitted from: Home ? ?Presentation:   ?Tina Jenkins presented with respiratory distress, hypoxic, been short of breath 1-2 days that became worse. Placed on Bi-pap in the ED, given IV lasix and nitro drip started. BNP 461, Troponins negative, CXR showed pulmonary edema. Patient schedule for a left heart cath 07/27/21, is currently on room air. No chest pain, has felt like she has lost some weight with fluid loss.  ? ?Heart Failure education was done with both patient and husband at bedside. Educated about diet and fluid restrictions, daily weights, the importance of when to call their doctor or come to the ED, taking all medications as prescribed, and keeping all doctor appointments. Both patient and husband voiced their understanding and were very interactive with their questions. Patient will have a left heart cath 07/27/21 and will wait upon a medication regime. Scheduled for a HV TOC hospital follow up appointment on 08/04/21 '@9am'$ .  ? ?ECHO/ LVEF: 35 % ? ?Clinical Course: ? ?Past Medical History:  ?Diagnosis Date  ? Chronic kidney disease   ? kidney stones  ? Hypercholesteremia   ? Hypertension   ?  ? ?Social History  ? ?Socioeconomic History  ? Marital status: Married  ?  Spouse name: Tina Jenkins  ? Number of children: 1  ? Years of education: Not on file  ? Highest education level: High school graduate  ?Occupational History  ? 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  ?Tobacco Use  ? Smoking status: Never  ? Smokeless tobacco: Not on file  ?Vaping Use  ? Vaping Use: Never used  ?Substance and Sexual Activity  ? Alcohol use: No  ? Drug use: No  ? Sexual activity: Not on file  ?Other Topics Concern  ? Not on file  ?Social History Narrative  ? Not on file  ? ?Social Determinants of Health  ? ?Financial Resource Strain: Low Risk   ? Difficulty of Paying Living Expenses: Not hard at  all  ?Food Insecurity: No Food Insecurity  ? Worried About Charity fundraiser in the Last Year: Never true  ? Ran Out of Food in the Last Year: Never true  ?Transportation Needs: No Transportation Needs  ? Lack of Transportation (Medical): No  ? Lack of Transportation (Non-Medical): No  ?Physical Activity: Not on file  ?Stress: Not on file  ?Social Connections: Not on file  ? ?Education Assessment and Provision: ? ?Detailed education and instructions provided on heart failure disease management including the following: ? ?Signs and symptoms of Heart Failure ?When to call the physician ?Importance of daily weights ?Low sodium diet ?Fluid restriction ?Medication management ?Anticipated future follow-up appointments ? ?Patient education given on each of the above topics.  Patient acknowledges understanding via teach back method and acceptance of all instructions. ? ?Education Materials:  "Living Better With Heart Failure" Booklet, HF zone tool, & Daily Weight Tracker Tool. ? ?Patient has scale at home: yes ?Patient has pill box at home: NA   ? ?High Risk Criteria for Readmission and/or Poor Patient Outcomes: ?Heart failure hospital admissions (last 6 months): 0  ?No Show rate: 0 % ?Difficult social situation: No ?Demonstrates medication adherence: yes ?Primary Language: English ?Literacy level: Reading, writing, and comprehension. ? ?Barriers of Care:   ?New to Heart Failure ? ?Considerations/Referrals:  ? ?Referral made to Heart Failure Pharmacist Stewardship: Yes,  ?Referral made to Heart Failure CSW/NCM TOC:  No needs ?Referral made to Heart & Vascular TOC clinic: Yes, 08/04/21 @ 9am. ? ?Items for Follow-up on DC/TOC: ?Optimize medications ?Hospital Follow-up ? ?Earnestine Leys, BSN, RN ?Heart Failure Nurse Navigator ?Secure Chat Only   ?

## 2021-07-26 NOTE — TOC Progression Note (Signed)
Transition of Care (TOC) - Progression Note  ? ? ?Patient Details  ?Name: Tina Jenkins ?MRN: [id number] ?Date of Birth: Oct 03, 1950 ? ?Transition of Care (TOC) CM/SW Contact  ?Zenon Mayo, RN ?Phone Number: ?07/26/2021, 3:41 PM ? ?Clinical Narrative:    ?from home with spouse, on po lasix today, cath today, plan dc on Wed, no needs. TOC will cont to follow for dc needs.  ? ? ?  ?  ? ?Expected Discharge Plan and Services ?  ?  ?  ?  ?  ?                ?  ?  ?  ?  ?  ?  ?  ?  ?  ?  ? ? ?Social Determinants of Health (SDOH) Interventions ?Food Insecurity Interventions: Intervention Not Indicated ?Financial Strain Interventions: Intervention Not Indicated ?Housing Interventions: Intervention Not Indicated ?Transportation Interventions: Intervention Not Indicated ? ?Readmission Risk Interventions ?   ? View : No data to display.  ?  ?  ?  ? ? ?

## 2021-07-26 NOTE — Progress Notes (Signed)
Heart Failure Stewardship Pharmacist Progress Note ? ? ?PCP: Kelton Pillar, MD ?PCP-Cardiologist: None  ? ? ?HPI:  ?71 yo F with PMH of HTN, HLD, kidney stones, and osteoarthritis. She presented to the ED on 4/15 with shortness of breath and orthopnea. CXR with acute pulmonary edema. An ECHO was done on 4/16 and LVEF is 35-40% with moderate LVH. R/LHC on 4/18 with minimal CAD and normal filling pressures. Question viral (recent URTI) etiology of HF vs genetic etiology.  ? ?Current HF Medications: ?Diuretic: furosemide 40 mg PO daily ?Beta Blocker: carvedilol 3.125 mg BID ?Aldosterone Antagonist: spironolactone 12.5 mg daily ? ?Prior to admission HF Medications: ?ACE/ARB/ARNI: benazepril 20 mg daily ? ?Pertinent Lab Values: ?Serum creatinine 1.23, BUN 24, Potassium 4.4, Sodium 136, BNP 461, A1c 5.9  ? ?Vital Signs: ?Weight: 146 lbs (admission weight: 148 lbs) ?Blood pressure: 110/70s  ?Heart rate: 80-90s  ?I/O: -1.9L yesterday; net -3.7L ? ?Medication Assistance / Insurance Benefits Check: ?Does the patient have prescription insurance?  Yes ?Type of insurance plan: BCBS Medicare ? ?Does the patient qualify for medication assistance through manufacturers or grants?   Pending ?Eligible grants and/or patient assistance programs: pending ?Medication assistance applications in progress: none  ?Medication assistance applications approved: none ?Approved medication assistance renewals will be completed by: pending ? ?Outpatient Pharmacy:  ?Prior to admission outpatient pharmacy: [facility] ?Is the patient willing to use [facility] pharmacy at discharge? Yes ?Is the patient willing to transition their outpatient pharmacy to utilize a [facility] outpatient pharmacy?   Pending ?  ? ?Assessment: ?1. 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 Recommend further NICM workup. NYHA class II symptoms. ?- Agree with transitioning to furosemide 40 mg PO given RHC findings today ?- Continue  carvedilol 3.125 mg BID ?- PTA benazepril - consider optimizing to Entresto 24/26 mg BID  ?- Continue spironolactone 12.5 mg daily ?- Consider adding SGLT2i prior to discharge ?  ?Plan: ?1) Medication changes recommended at this time: ?- Consider starting SGLT2i tomorrow if not discharging today ? ?2) Patient assistance: ?- Entresto copay $43.50 ?[place] copay $43.50 ?- Jardiance copay $43.50 ? ?3)  Education  ?- To be completed prior to discharge ? ?Kerby Nora, PharmD, BCPS ?Heart Failure Stewardship Pharmacist ?Phone [phone] ? ? ?

## 2021-07-27 ENCOUNTER — Encounter (HOSPITAL_COMMUNITY)
Admission: EM | Disposition: A | Discharge status: Home / Self Care | Payer: Self-pay | Source: Home / Self Care | Attending: Internal Medicine

## 2021-07-27 ENCOUNTER — Encounter (HOSPITAL_COMMUNITY): Payer: Self-pay | Admitting: Interventional Cardiology

## 2021-07-27 ENCOUNTER — Other Ambulatory Visit (HOSPITAL_COMMUNITY): Payer: Self-pay

## 2021-07-27 DIAGNOSIS — E785 Hyperlipidemia, unspecified: Secondary | ICD-10-CM | POA: Diagnosis not present

## 2021-07-27 DIAGNOSIS — I10 Essential (primary) hypertension: Secondary | ICD-10-CM | POA: Diagnosis not present

## 2021-07-27 DIAGNOSIS — I509 Heart failure, unspecified: Secondary | ICD-10-CM | POA: Diagnosis not present

## 2021-07-27 DIAGNOSIS — I5021 Acute systolic (congestive) heart failure: Secondary | ICD-10-CM | POA: Diagnosis not present

## 2021-07-27 HISTORY — PX: RIGHT/LEFT HEART CATH AND CORONARY ANGIOGRAPHY: CATH118266

## 2021-07-27 SURGERY — RIGHT/LEFT HEART CATH AND CORONARY ANGIOGRAPHY
Anesthesia: LOCAL

## 2021-07-27 MED ORDER — HYDRALAZINE HCL 20 MG/ML IJ SOLN: 10.0000 mg | INTRAMUSCULAR | Status: AC | PRN

## 2021-07-27 MED ORDER — SODIUM CHLORIDE 0.9 % IV SOLN: 250.0000 mL | INTRAVENOUS | Status: DC | PRN

## 2021-07-27 MED ORDER — HEPARIN (PORCINE) IN NACL 1000-0.9 UT/500ML-% IV SOLN
INTRAVENOUS | Status: AC
  Filled 2021-07-27: qty 500

## 2021-07-27 MED ORDER — LIDOCAINE HCL (PF) 1 % IJ SOLN
INTRAMUSCULAR | Status: DC | PRN
  Administered 2021-07-27 (×2): 2 mL

## 2021-07-27 MED ORDER — HEPARIN (PORCINE) IN NACL 1000-0.9 UT/500ML-% IV SOLN
INTRAVENOUS | Status: DC | PRN
  Administered 2021-07-27 (×2): 500 mL

## 2021-07-27 MED ORDER — SODIUM CHLORIDE 0.9 % IV SOLN: INTRAVENOUS | Status: AC

## 2021-07-27 MED ORDER — FENTANYL CITRATE (PF) 100 MCG/2ML IJ SOLN
INTRAMUSCULAR | Status: AC
  Filled 2021-07-27: qty 2

## 2021-07-27 MED ORDER — HEPARIN SODIUM (PORCINE) 1000 UNIT/ML IJ SOLN
INTRAMUSCULAR | Status: AC
Start: 2021-07-27 — End: ?
  Filled 2021-07-27: qty 10

## 2021-07-27 MED ORDER — HEPARIN SODIUM (PORCINE) 5000 UNIT/ML IJ SOLN: 5000.0000 [IU] | Freq: Three times a day (TID) | INTRAMUSCULAR | Status: DC

## 2021-07-27 MED ORDER — MIDAZOLAM HCL 2 MG/2ML IJ SOLN
INTRAMUSCULAR | Status: AC
  Filled 2021-07-27: qty 2

## 2021-07-27 MED ORDER — LABETALOL HCL 5 MG/ML IV SOLN: 10.0000 mg | INTRAVENOUS | Status: AC | PRN

## 2021-07-27 MED ORDER — SODIUM CHLORIDE 0.9% FLUSH: 3.0000 mL | INTRAVENOUS | Status: DC | PRN

## 2021-07-27 MED ORDER — HEPARIN SODIUM (PORCINE) 1000 UNIT/ML IJ SOLN
INTRAMUSCULAR | Status: DC | PRN
Start: 2021-07-27 — End: 2021-07-27
  Administered 2021-07-27: 3500 [IU] via INTRAVENOUS

## 2021-07-27 MED ORDER — VERAPAMIL HCL 2.5 MG/ML IV SOLN
INTRAVENOUS | Status: AC
Start: 2021-07-27 — End: ?
  Filled 2021-07-27: qty 2

## 2021-07-27 MED ORDER — ACETAMINOPHEN 325 MG PO TABS: 650.0000 mg | ORAL_TABLET | ORAL | Status: DC | PRN

## 2021-07-27 MED ORDER — FENTANYL CITRATE (PF) 100 MCG/2ML IJ SOLN
INTRAMUSCULAR | Status: DC | PRN
  Administered 2021-07-27: 25 ug via INTRAVENOUS

## 2021-07-27 MED ORDER — SODIUM CHLORIDE 0.9% FLUSH
3.0000 mL | Freq: Two times a day (BID) | INTRAVENOUS | Status: DC
  Administered 2021-07-28: 3 mL via INTRAVENOUS

## 2021-07-27 MED ORDER — FUROSEMIDE 40 MG PO TABS
40.0000 mg | ORAL_TABLET | Freq: Every day | ORAL | Status: DC
  Administered 2021-07-28: 40 mg via ORAL
  Filled 2021-07-27: qty 1

## 2021-07-27 MED ORDER — MIDAZOLAM HCL 2 MG/2ML IJ SOLN
INTRAMUSCULAR | Status: DC | PRN
  Administered 2021-07-27: 1 mg via INTRAVENOUS

## 2021-07-27 MED ORDER — OXYCODONE HCL 5 MG PO TABS: 5.0000 mg | ORAL_TABLET | ORAL | Status: DC | PRN

## 2021-07-27 MED ORDER — IOHEXOL 350 MG/ML SOLN
INTRAVENOUS | Status: DC | PRN
  Administered 2021-07-27: 50 mL

## 2021-07-27 MED ORDER — ONDANSETRON HCL 4 MG/2ML IJ SOLN: 4.0000 mg | Freq: Four times a day (QID) | INTRAMUSCULAR | Status: DC | PRN

## 2021-07-27 MED ORDER — LIDOCAINE HCL (PF) 1 % IJ SOLN
INTRAMUSCULAR | Status: AC
  Filled 2021-07-27: qty 30

## 2021-07-27 MED ORDER — VERAPAMIL HCL 2.5 MG/ML IV SOLN
INTRAVENOUS | Status: DC | PRN
  Administered 2021-07-27: 10 mL via INTRA_ARTERIAL

## 2021-07-27 SURGICAL SUPPLY — 13 items

## 2021-07-27 NOTE — Plan of Care (Signed)
  Problem: Education: Goal: Knowledge of General Education information will improve Description: Including pain rating scale, medication(s)/side effects and non-pharmacologic comfort measures Outcome: Progressing   Problem: Health Behavior/Discharge Planning: Goal: Ability to manage health-related needs will improve Outcome: Progressing   Problem: Clinical Measurements: Goal: Diagnostic test results will improve Outcome: Progressing   

## 2021-07-27 NOTE — Interval H&P Note (Signed)
Cath Lab Visit (complete for each Cath Lab visit) ? ?Clinical Evaluation Leading to the Procedure:  ? ?ACS: No. ? ?Non-ACS:   ? ?Anginal Classification: CCS II ? ?Anti-ischemic medical therapy: Minimal Therapy (1 class of medications) ? ?Non-Invasive Test Results: No non-invasive testing performed ? ?Prior CABG: No previous CABG ? ? ? ? ? ?History and Physical Interval Note: ? ?07/27/2021 ?6:56 AM ? ?Tina Jenkins  has presented today for surgery, with the diagnosis of heart failure.  The various methods of treatment have been discussed with the patient and family. After consideration of risks, benefits and other options for treatment, the patient has consented to  Procedure(s): ?RIGHT/LEFT HEART CATH AND CORONARY ANGIOGRAPHY (N/A) as a surgical intervention.  The patient's history has been reviewed, patient examined, no change in status, stable for surgery.  I have reviewed the patient's chart and labs.  Questions were answered to the patient's satisfaction.   ? ? ?Belva Crome III ? ? ?

## 2021-07-27 NOTE — Op Note (Addendum)
Right dominant anatomy with normal RCA. ?The left main, LAD, and circumflex. ?Less than 250% ostial circumflex and  ?Less than 25% distal left main. ?Ostial LAD stent 25% ?Left main, LAD, circumflex [place] [address] disease with no obstruction greater than 30%. ?Severe global left ventricular dysfunction with EF 25 to percent by hand-injection.  LVEDP low normal. ?Right heart pressures normal.  Capillary wedge pressure is normal to low. ? ?Overall, systolic heart failure, compensated based on hemodynamics.  No significant coronary disease.  Idiopathic cardiomyopathy of uncertain etiology.  Possible postviral.  Guideline directed quadruple therapy with hopes that LV function will significantly improved. ?

## 2021-07-27 NOTE — Progress Notes (Signed)
? ?Progress Note ? ?Patient Name: Tina Jenkins ?Date of Encounter: 07/27/2021 ? ?[facility] Cardiologist: None  ? ?Subjective  ? ?Post catheterization, feeling better. ? ?Hemoglobin 15.3, creatinine 1.23, platelets 700 ? ?Net out 4.1 L ? ?Current weight 147 pounds ? ?Inpatient Medications  ?  ?Scheduled Meds: ? aspirin EC  81 mg Oral Daily  ? atorvastatin  20 mg Oral QHS  ? carvedilol  3.125 mg Oral BID WC  ? enoxaparin (LOVENOX) injection  40 mg Subcutaneous Q24H  ? [START ON 07/28/2021] furosemide  40 mg Oral Daily  ? sodium chloride flush  3 mL Intravenous Q12H  ? spironolactone  12.5 mg Oral Daily  ? ?Continuous Infusions: ? sodium chloride    ? sodium chloride    ? ?PRN Meds: ?sodium chloride, acetaminophen, hydrALAZINE, labetalol, ondansetron **OR** ondansetron (ZOFRAN) IV, oxyCODONE, sodium chloride flush  ? ?Vital Signs  ?  ?Vitals:  ? 07/26/21 1937 07/27/21 0504 07/27/21 0729 07/27/21 0800  ?BP: 113/86 110/73  (!) 128/93  ?Pulse: 90 81  93  ?Resp: '18 18  20  '$ ?Temp: (!) 97.4 ?F (36.3 ?C) 98.1 ?F (36.7 ?C)  98 ?F (36.7 ?C)  ?TempSrc: Oral Oral  Oral  ?SpO2: 96% 94% 95% 97%  ?Weight:  66.6 kg    ?Height:      ? ? ?Intake/Output Summary (Last 24 hours) at 07/27/2021 0916 ?Last data filed at 07/27/2021 0915 ?Gross per 24 hour  ?Intake 714 ml  ?Output 1850 ml  ?Net -1136 ml  ? ? ?  07/27/2021  ?  5:04 AM 07/26/2021  ?  6:18 PM 07/26/2021  ?  4:18 AM  ?Last 3 Weights  ?Weight (lbs) 146 lb 12.8 oz 147 lb 11.3 oz 146 lb 14.4 oz  ?Weight (kg) 66.588 kg 67 kg 66.633 kg  ?   ? ?Telemetry  ?  ?PVCs noted.  No adverse arrhythmias- Personally Reviewed ? ?ECG  ?  ?No new- Personally Reviewed ? ?Physical Exam  ? ?GEN: No acute distress.   ?Neck: No JVD ?Cardiac: RRR, no murmurs, rubs, or gallops.  ?Respiratory: Clear to auscultation bilaterally. ?GI: Soft, nontender, non-distended  ?MS: No significant edema; No deformity. ?Neuro:  Nonfocal  ?Psych: Normal affect  ? ?Labs  ?  ?High Sensitivity Troponin:   ?Recent Labs  ?Lab  07/24/21 ?8341 07/24/21 ?9622  ?TROPONINIHS 15 19*  ?   ?Chemistry ?Recent Labs  ?Lab 07/24/21 ?0441 07/24/21 ?1746 07/25/21 ?0411 07/26/21 ?0225  ?NA 141  --  137 136  ?K 4.4  --  3.4* 4.4  ?CL 109  --  102 105  ?CO2 25  --  26 23  ?GLUCOSE 156*  --  104* 106*  ?BUN 22  --  20 24*  ?CREATININE 1.14* 1.10* 1.13* 1.23*  ?CALCIUM 9.1  --  8.7* 9.1  ?PROT 7.5  --  6.5  --   ?ALBUMIN 3.9  --  3.4*  --   ?AST 29  --  21  --   ?ALT 21  --  16  --   ?ALKPHOS 82  --  65  --   ?BILITOT 0.5  --  0.7  --   ?GFRNONAA 51* 53* 52* 47*  ?ANIONGAP 7  --  9 8  ?  ?Lipids No results for input(s): CHOL, TRIG, HDL, LABVLDL, LDLCALC, CHOLHDL in the last 168 hours.  ?Hematology ?Recent Labs  ?Lab 07/24/21 ?1746 07/25/21 ?0411 07/26/21 ?0225  ?WBC 5.8 5.4 5.3  ?RBC 5.75* 5.52* 5.75*  ?  HGB 15.2* 14.7 15.3*  ?HCT 49.0* 46.7* 47.9*  ?MCV 85.2 84.6 83.3  ?MCH 26.4 26.6 26.6  ?MCHC 31.0 31.5 31.9  ?RDW 17.6* 17.2* 17.4*  ?PLT 652* 600* 700*  ? ?Thyroid  ?Recent Labs  ?Lab 07/25/21 ?0411  ?TSH 0.707  ?  ?BNP ?Recent Labs  ?Lab 07/24/21 ?3810  ?BNP 461.0*  ?  ?DDimer No results for input(s): DDIMER in the last 168 hours.  ? ?Radiology  ?  ?CARDIAC CATHETERIZATION ? ?Result Date: 07/27/2021 ?CONCLUSIONS: 25% distal left main. 25% ostial LAD. 25% ostial circumflex. Normal dominant right coronary. Normal right heart pressures without pulmonary hypertension.  LVEDP 3 mmHg and mean wedge pressure 4 mmHg. Global left ventricular hypokinesis consistent with systolic heart failure, compensated. RECOMMENDATIONS: Etiology uncertain but illness was preceded by an upper respiratory infection and therefore could be postviral myocarditis.  Father died of heart failure and therefore genetic predisposition is a consideration as well. Quadruple therapy with hopeful full recovery of LV function. ? ?ECHOCARDIOGRAM COMPLETE ? ?Result Date: 07/25/2021 ?   ECHOCARDIOGRAM REPORT   Patient Name:   Tina Jenkins Date of Exam: 07/25/2021 Medical Rec #:  [id number]       Height:       59.0 in Accession #:    [id number]     Weight:       147.9 lb Date of Birth:  1950/06/08       BSA:          1.622 m? Patient Age:    71 years       BP:           116/75 mmHg Patient Gender: F              HR:           88 bpm. Exam Location:  Inpatient Procedure: 2D Echo, Cardiac Doppler and Color Doppler Indications:    CHF-acute diastolic  History:        Patient has no prior history of Echocardiogram examinations.                 Signs/Symptoms:Shortness of Breath; Risk Factors:Hypertension                 and Dyslipidemia. DOE.  Sonographer:    Clayton Lefort RDCS (AE) Referring Phys: [place]  Sonographer Comments: Suboptimal subcostal window. IMPRESSIONS  1. Left ventricular ejection fraction, by estimation, is 35 to 40%. The left ventricle has moderately decreased function. The left ventricle demonstrates global hypokinesis. There is moderate left ventricular hypertrophy. Left ventricular diastolic parameters are indeterminate.  2. Right ventricular systolic function is normal. The right ventricular size is normal. Tricuspid regurgitation signal is inadequate for assessing PA pressure.  3. Left atrial size was moderately dilated.  4. The mitral valve is abnormal. Mild to moderate mitral valve regurgitation. No evidence of mitral stenosis.  5. The aortic valve has an indeterminant number of cusps. There is mild calcification of the aortic valve. There is mild thickening of the aortic valve. Aortic valve regurgitation is not visualized. No aortic stenosis is present.  6. The inferior vena cava is normal in size with greater than 50% respiratory variability, suggesting right atrial pressure of 3 mmHg. FINDINGS  Left Ventricle: Left ventricular ejection fraction, by estimation, is 35 to 40%. The left ventricle has moderately decreased function. The left ventricle demonstrates global hypokinesis. The left ventricular internal cavity size was normal in size. There is moderate left ventricular  hypertrophy. Left  ventricular diastolic parameters are indeterminate. Right Ventricle: The right ventricular size is normal. No increase in right ventricular wall thickness. Right ventricular systolic function is normal. Tricuspid regurgitation signal is inadequate for assessing PA pressure. Left Atrium: Left atrial size was moderately dilated. Right Atrium: Right atrial size was normal in size. Pericardium: There is no evidence of pericardial effusion. Mitral Valve: The MR vena contracta is 0.3 cm. The mitral valve is abnormal. There is mild thickening of the mitral valve leaflet(s). There is mild calcification of the mitral valve leaflet(s). Mild mitral annular calcification. Mild to moderate mitral valve regurgitation. No evidence of mitral valve stenosis. Tricuspid Valve: The tricuspid valve is normal in structure. Tricuspid valve regurgitation is trivial. No evidence of tricuspid stenosis. Aortic Valve: The aortic valve has an indeterminant number of cusps. There is mild calcification of the aortic valve. There is mild thickening of the aortic valve. There is mild aortic valve annular calcification. Aortic valve regurgitation is not visualized. No aortic stenosis is present. Aortic valve mean gradient measures 3.0 mmHg. Aortic valve peak gradient measures 5.0 mmHg. Aortic valve area, by VTI measures 1.74 cm?. Pulmonic Valve: The pulmonic valve was not well visualized. Pulmonic valve regurgitation is not visualized. No evidence of pulmonic stenosis. Aorta: The aortic root is normal in size and structure. Venous: The inferior vena cava is normal in size with greater than 50% respiratory variability, suggesting right atrial pressure of 3 mmHg. IAS/Shunts: No atrial level shunt detected by color flow Doppler.  LEFT VENTRICLE PLAX 2D LVIDd:         4.60 cm LVIDs:         3.80 cm LV PW:         1.30 cm LV IVS:        1.30 cm LVOT diam:     2.20 cm LV SV:         33 LV SV Index:   20 LVOT Area:     3.80 cm?  LV  Volumes (MOD) LV vol d, MOD A2C: 111.0 ml LV vol d, MOD A4C: 114.0 ml LV vol s, MOD A2C: 75.0 ml LV vol s, MOD A4C: 71.9 ml LV SV MOD A2C:     36.0 ml LV SV MOD A4C:     114.0 ml LV SV MOD BP:      40.5 ml RIGHT

## 2021-07-27 NOTE — Progress Notes (Signed)
?PROGRESS NOTE ? ? ? 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 ?Subjective: ?-Feels okay overall, no events overnight ? ?Assessment & Plan: ? ?Acute systolic CHF ?Acute hypoxic respiratory failure ?-Presenting with dyspnea on exertion, orthopnea, hypoxia, required BiPAP in the ER, now off ?-Clinically improving, she is 2.8 L negative ?-2D echo noted EF of 35%, global hypokinesis, moderate LVH ?-Clinically appears euvolemic, filling pressures are normal, continue oral p.o. Lasix and Aldactone ?-BP dropped to 80s late morning, hold off on adding SGLT 2i today ?-Home tomorrow if stable, BMP in a.m., further GDMT as outpatient ?  ?Essential hypertension ?-Stable, improved, Lotensin/HCTZ discontinued ?  ?Dyslipidemia ?-Continue statin ?  ?Hyperglycemia ?-Hemoglobin A1c is 5.9 consistent with borderline diabetes ?-Add SGLT2i tomorrow if blood pressure tolerates ?  ?Mildly elevated high-sensitivity troponin ?-Secondary to CHF, clinically no evidence of ACS ? ?Chronic thrombocytosis ?- chronic thrombocytosis dating back at least 10 years, needs hematology referral to rule out P vera or other myeloproliferative disorder ?  ?DVT prophylaxis: Lovenox ?Code Status: Full code ?Family Communication: Spouse at bedside ?Disposition Plan: Home tomorrow ?  ? ?Procedures:  ? ?Antimicrobials:  ? ? ?Objective: ?Vitals:  ? 07/27/21 0900 07/27/21 0915  07/27/21 1000 07/27/21 1100  ?BP: 131/84 (!) 112/59 119/70 (!) 87/66  ?Pulse: 89 86    ?Resp: 19 19    ?Temp:      ?TempSrc:      ?SpO2: 96% 95%    ?Weight:      ?Height:      ? ? ?Intake/Output Summary (Last 24 hours) at 07/27/2021 1431 ?Last data filed at 07/27/2021 1308 ?Gross per 24 hour  ?Intake 834 ml  ?Output 1350 ml  ?Net -516 ml  ? ?Filed Weights  ? 07/26/21 0418 07/26/21 1818 07/27/21 0504  ?Weight: 66.6 kg 67 kg 66.6 kg  ? ? ?Examination: ? ?General exam: Pleasant, no distress ?HEENT: No JVD ?CVs: S1-S2, regular rate rhythm ?Lungs: Clear today ?Abdomen: Soft, nontender, bowel sounds present ?Extremities: No edema  ?Skin: No rashes ?Psychiatry: Judgement and insight appear normal. Mood & affect appropriate.  ? ? ? ?Data Reviewed:  ? ?CBC: ?Recent Labs  ?Lab 07/24/21 ?0441 07/24/21 ?1746 07/25/21 ?0411 07/26/21 ?0225  ?WBC 7.7 5.8 5.4 5.3  ?NEUTROABS 3.6  --   --   --   ?HGB 14.7 15.2* 14.7 15.3*  ?HCT 48.8* 49.0* 46.7* 47.9*  ?MCV 86.4 85.2 84.6 83.3  ?PLT 852* 652* 600* 700*  ? ?Basic Metabolic Panel: ?Recent Labs  ?Lab 07/24/21 ?0441 07/24/21 ?1746 07/25/21 ?0411 07/26/21 ?0225  ?NA 141  --  137 136  ?K 4.4  --  3.4* 4.4  ?CL 109  --  102 105  ?CO2 25  --  26 23  ?GLUCOSE 156*  --  104* 106*  ?BUN 22  --  20 24*  ?CREATININE 1.14* 1.10* 1.13* 1.23*  ?CALCIUM 9.1  --  8.7* 9.1  ? ?GFR: ?Estimated Creatinine Clearance: 34.3 mL/min (A) (  by C-G formula based on SCr of 1.23 mg/dL (H)). ?Liver Function Tests: ?Recent Labs  ?Lab 07/24/21 ?0441 07/25/21 ?0411  ?AST 29 21  ?ALT 21 16  ?ALKPHOS 82 65  ?BILITOT 0.5 0.7  ?PROT 7.5 6.5  ?ALBUMIN 3.9 3.4*  ? ?No results for input(s): LIPASE, AMYLASE in the last 168 hours. ?No results for input(s): AMMONIA in the last 168 hours. ?Coagulation Profile: ?No results for input(s): INR, PROTIME in the last 168 hours. ?Cardiac Enzymes: ?No results for input(s): CKTOTAL, CKMB, CKMBINDEX, TROPONINI in the last 168 hours. ?BNP (last 3 results) ?No results for input(s): PROBNP in  the last 8760 hours. ?HbA1C: ?Recent Labs  ?  07/25/21 ?0411  ?HGBA1C 5.9*  ? ?CBG: ?No results for input(s): GLUCAP in the last 168 hours. ?Lipid Profile: ?No results for input(s): CHOL, HDL, LDLCALC, TRIG, CHOLHDL, LDLDIRECT in the last 72 hours. ?Thyroid Function Tests: ?Recent Labs  ?  07/25/21 ?0411  ?TSH 0.707  ? ?Anemia Panel: ?No results for input(s): VITAMINB12, FOLATE, FERRITIN, TIBC, IRON, RETICCTPCT in the last 72 hours. ?Urine analysis: ?   ?Component Value Date/Time  ? [place] YELLOW 08/26/2012 2003  ? APPEARANCEUR CLOUDY (A) 08/26/2012 2003  ? LABSPEC 1.013 08/26/2012 2003  ? PHURINE 6.0 08/26/2012 2003  ? [place] NEGATIVE 08/26/2012 2003  ? HGBUR MODERATE (A) 08/26/2012 2003  ? [place] NEGATIVE 08/26/2012 2003  ? [place] Stain NEGATIVE 08/26/2012 2003  ? [place] NEGATIVE 08/26/2012 2003  ? UROBILINOGEN 0.2 08/26/2012 2003  ? NITRITE POSITIVE (A) 08/26/2012 2003  ? LEUKOCYTESUR LARGE (A) 08/26/2012 2003  ? ?Sepsis Labs: ?'@LABRCNTIP'$ (procalcitonin:4,lacticidven:4) ? ?) ?Recent Results (from the past 240 hour(s))  ?Resp Panel by RT-PCR (Flu A&B, Covid) Nasopharyngeal Swab     Status: None  ? Collection Time: 07/24/21  4:33 AM  ? Specimen: Nasopharyngeal Swab; Nasopharyngeal(NP) swabs in vial transport medium  ?Result Value Ref Range Status  ? SARS Coronavirus 2 by RT PCR NEGATIVE NEGATIVE Final  ?  Comment: (NOTE) ?SARS-CoV-2 target nucleic acids are NOT DETECTED. ? ?The SARS-CoV-2 RNA is generally detectable in upper respiratory ?specimens during the acute phase of infection. The lowest ?concentration of SARS-CoV-2 viral copies this assay can detect is ?138 copies/mL. A negative result does not preclude SARS-Cov-2 ?infection and should not be used as the sole basis for treatment or ?other patient management decisions. A negative result may occur with  ?improper specimen collection/handling, submission of specimen other ?than nasopharyngeal swab, presence of viral mutation(s) within the ?areas  targeted by this assay, and inadequate number of viral ?copies(<138 copies/mL). A negative result must be combined with ?clinical observations, patient history, and epidemiological ?information. The expected result is Negative. ? ?Fact Sheet for Patients:  ?EntrepreneurPulse.com.au ? ?Fact Sheet for Healthcare Providers:  ?IncredibleEmployment.be ? ?This test is no t yet approved or cleared by the [place] FDA and  ?has been authorized for detection and/or diagnosis of SARS-CoV-2 by ?FDA under an Emergency Use Authorization (EUA). This EUA will remain  ?in effect (meaning this test can be used) for the duration of the ?COVID-19 declaration under Section 564(b)(1) of the Act, 21 ?U.S.C.section 360bbb-3(b)(1), unless the authorization is terminated  ?or revoked sooner.  ? ? ?  ? Influenza A by PCR NEGATIVE NEGATIVE Final  ? Influenza B by PCR NEGATIVE NEGATIVE Final  ?  Comment: (NOTE) ?The Xpert Xpress SARS-CoV-2/FLU/RSV plus assay is intended as an aid ?in the diagnosis of influenza from Nasopharyngeal swab specimens and ?should not be used as a sole basis for  treatment. Nasal washings and ?aspirates are unacceptable for Xpert Xpress SARS-CoV-2/FLU/RSV ?testing. ? ?Fact Sheet for Patients: ?EntrepreneurPulse.com.au ? ?Fact Sheet for Healthcare Providers: ?IncredibleEmployment.be ? ?This test is not yet approved or cleared by the [place] FDA and ?has been authorized for detection and/or diagnosis of SARS-CoV-2 by ?FDA under an Emergency Use Authorization (EUA). This EUA will remain ?in effect (meaning this test can be used) for the duration of the ?COVID-19 declaration under Section 564(b)(1) of the Act, 21 U.S.C. ?section 360bbb-3(b)(1), unless the authorization is terminated or ?revoked. ? ?Performed at [facility], [address], [place] [address] ?  ?  ? ?Radiology Studies: ?CARDIAC CATHETERIZATION ? ?Result  Date: 07/27/2021 ?CONCLUSIONS: 25% distal left main. 25% ostial LAD. 25% ostial circumflex. Normal dominant right coronary. Normal right heart pressures without pulmonary hypertension.  LVEDP 3 mmHg and mean w

## 2021-07-28 ENCOUNTER — Other Ambulatory Visit (HOSPITAL_COMMUNITY): Payer: Self-pay

## 2021-07-28 DIAGNOSIS — I5021 Acute systolic (congestive) heart failure: Secondary | ICD-10-CM | POA: Diagnosis not present

## 2021-07-28 LAB — POCT I-STAT 7, (LYTES, BLD GAS, ICA,H+H)
Acid-Base Excess: 2 mmol/L (ref 0.0–2.0)
Acid-base deficit: 2 mmol/L (ref 0.0–2.0)
Bicarbonate: 24.9 mmol/L (ref 20.0–28.0)
Bicarbonate: 28.7 mmol/L — ABNORMAL HIGH (ref 20.0–28.0)
Calcium, Ion: 1.24 mmol/L (ref 1.15–1.40)
Calcium, Ion: 1.26 mmol/L (ref 1.15–1.40)
HCT: 47 % — ABNORMAL HIGH (ref 36.0–46.0)
HCT: 49 % — ABNORMAL HIGH (ref 36.0–46.0)
Hemoglobin: 16 g/dL — ABNORMAL HIGH (ref 12.0–15.0)
Hemoglobin: 16.7 g/dL — ABNORMAL HIGH (ref 12.0–15.0)
O2 Saturation: 72 %
O2 Saturation: 95 %
Potassium: 3.7 mmol/L (ref 3.5–5.1)
Potassium: 3.9 mmol/L (ref 3.5–5.1)
Sodium: 132 mmol/L — ABNORMAL LOW (ref 135–145)
Sodium: 138 mmol/L (ref 135–145)
TCO2: 26 mmol/L (ref 22–32)
TCO2: 30 mmol/L (ref 22–32)
pCO2 arterial: 46.8 mmHg (ref 32–48)
pCO2 arterial: 49.1 mmHg — ABNORMAL HIGH (ref 32–48)
pH, Arterial: 7.334 — ABNORMAL LOW (ref 7.35–7.45)
pH, Arterial: 7.375 (ref 7.35–7.45)
pO2, Arterial: 39 mmHg — CL (ref 83–108)
pO2, Arterial: 83 mmHg (ref 83–108)

## 2021-07-28 LAB — BASIC METABOLIC PANEL
Anion gap: 7 (ref 5–15)
BUN: 24 mg/dL — ABNORMAL HIGH (ref 8–23)
CO2: 24 mmol/L (ref 22–32)
Calcium: 9.1 mg/dL (ref 8.9–10.3)
Chloride: 105 mmol/L (ref 98–111)
Creatinine, Ser: 1.22 mg/dL — ABNORMAL HIGH (ref 0.44–1.00)
GFR, Estimated: 47 mL/min — ABNORMAL LOW (ref >60)
Glucose, Bld: 114 mg/dL — ABNORMAL HIGH (ref 70–99)
Potassium: 3.9 mmol/L (ref 3.5–5.1)
Sodium: 136 mmol/L (ref 135–145)

## 2021-07-28 MED ORDER — CARVEDILOL 3.125 MG PO TABS
3.1250 mg | ORAL_TABLET | Freq: Two times a day (BID) | ORAL | 0 refills | Status: DC
Start: 2021-07-28 — End: 2021-08-23
  Filled 2021-07-28: qty 60, 30d supply, fill #0

## 2021-07-28 MED ORDER — DAPAGLIFLOZIN PROPANEDIOL 10 MG PO TABS
10.0000 mg | ORAL_TABLET | Freq: Every day | ORAL | 0 refills | Status: DC
Start: 2021-07-28 — End: 2021-08-23
  Filled 2021-07-28: qty 30, 30d supply, fill #0

## 2021-07-28 MED ORDER — FUROSEMIDE 40 MG PO TABS
40.0000 mg | ORAL_TABLET | Freq: Every day | ORAL | 0 refills | Status: DC
  Filled 2021-07-28: qty 30, 30d supply, fill #0

## 2021-07-28 MED ORDER — SPIRONOLACTONE 25 MG PO TABS
12.5000 mg | ORAL_TABLET | Freq: Every day | ORAL | 0 refills | Status: DC
  Filled 2021-07-28: qty 30, 60d supply, fill #0

## 2021-07-28 NOTE — Care Management Important Message (Signed)
Important Message ? ?Patient Details  ?Name: Tina Jenkins ?MRN: [id number] ?Date of Birth: 1951/01/23 ? ? ?Medicare Important Message Given:  Yes ? ? ? ? ?Shelda Altes ?07/28/2021, 11:22 AM ?

## 2021-07-28 NOTE — Progress Notes (Signed)
Discharge instructions, RX' s and follow up appts explained and provided to patient and husband verbalized understanding. TOC delivered d/c medications to patient in room. Patient left floor via wheelchair accompanied by staff. No c/o pain or shortness of breath at d/c. ? ?Averyanna Sax, Tivis Ringer, RN ? ?

## 2021-07-28 NOTE — Progress Notes (Signed)
Heart Failure Stewardship Pharmacist Progress Note ? ? ?PCP: Kelton Pillar, MD ?PCP-Cardiologist: None  ? ? ?HPI:  ?71 yo F with PMH of HTN, HLD, kidney stones, and osteoarthritis. She presented to the ED on 4/15 with shortness of breath and orthopnea. CXR with acute pulmonary edema. An ECHO was done on 4/16 and LVEF is 35-40% with moderate LVH. R/LHC on 4/18 with minimal CAD and normal filling pressures. Question viral (recent URTI) etiology of HF vs genetic etiology.  ? ?Discharge HF Medications: ?Diuretic: furosemide 40 mg PO daily ?Beta Blocker: carvedilol 3.125 mg BID ?Aldosterone Antagonist: spironolactone 12.5 mg daily ?SGLT2i: Farxiga 10 mg daily ? ?Prior to admission HF Medications: ?ACE/ARB/ARNI: benazepril 20 mg daily ? ?Pertinent Lab Values: ?Serum creatinine 1.22, BUN 24, Potassium 3.9, Sodium 136, BNP 461, A1c 5.9  ? ?Vital Signs: ?Weight: 147 lbs (admission weight: 148 lbs) ?Blood pressure: 110/70s  ?Heart rate: 80-90s  ?I/O: -647m yesterday; net -4L ? ?Medication Assistance / Insurance Benefits Check: ?Does the patient have prescription insurance?  Yes ?Type of insurance plan: BCBS Medicare ? ?Does the patient qualify for medication assistance through manufacturers or grants?   Pending ?Eligible grants and/or patient assistance programs: pending ?Medication assistance applications in progress: none  ?Medication assistance applications approved: none ?Approved medication assistance renewals will be completed by: pending ? ?Outpatient Pharmacy:  ?Prior to admission outpatient pharmacy: [facility] ?[facility] the [facility]? Yes ?Is the patient [facility]?   Pending ?  ? ?Assessment: ?1. 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 Recommend further NICM workup. NYHA class II symptoms. ?- Continue furosemide 40 mg PO ?- Continue carvedilol 3.125 mg  BID ?- PTA benazepril - consider optimizing to Entresto 24/26 mg BID as outpatient ?- Continue spironolactone 12.5 mg daily ?- Agree with adding Farxiga 10 mg daily ?  ?Plan: ?1) Medication changes recommended at this time: ?-  Agree with changes, discharge today ? ?2) Patient assistance: ?- Entresto copay $43.50 ?-[place] Gladecopay $43.50 ?- Jardiance copay $43.50 ? ?3)  Education  ?- Patient has been educated on current HF medications and potential additions to HF medication regimen ?- Patient verbalizes understanding that over the next few months, these medication doses may change and more medications may be added to optimize HF regimen ?- Patient has been educated on basic disease state pathophysiology and goals of therapy ? ? ?MKerby Nora PharmD, BCPS ?Heart Failure Stewardship Pharmacist ?Phone [phone]? ? ?

## 2021-07-28 NOTE — TOC Transition Note (Signed)
Transition of Care (TOC) - CM/SW Discharge Note ? ? ?Patient Details  ?Name: Tina Jenkins ?MRN: [id number] ?Date of Birth: 12-26-1950 ? ?Transition of Care (TOC) CM/SW Contact:  ?Zenon Mayo, RN ?Phone Number: ?07/28/2021, 10:05 AM ? ? ?Clinical Narrative:    ? ?Patient is for dc today, her transport is at the bedside, TOC to bring meds to room.  She has no other needs.  ? ?  ?  ? ? ?Patient Goals and CMS Choice ?  ?  ?  ? ?Discharge Placement ?  ?           ?  ?  ?  ?  ? ?Discharge Plan and Services ?  ?  ?           ?  ?  ?  ?  ?  ?  ?  ?  ?  ?  ? ?Social Determinants of Health (SDOH) Interventions ?Food Insecurity Interventions: Intervention Not Indicated ?Financial Strain Interventions: Intervention Not Indicated ?Housing Interventions: Intervention Not Indicated ?Transportation Interventions: Intervention Not Indicated ? ? ?Readmission Risk Interventions ?   ? View : No data to display.  ?  ?  ?  ? ? ? ? ? ?

## 2021-07-28 NOTE — Plan of Care (Signed)

## 2021-07-29 LAB — POCT I-STAT 7, (LYTES, BLD GAS, ICA,H+H)
Acid-Base Excess: 1 mmol/L (ref 0.0–2.0)
Bicarbonate: 27.5 mmol/L (ref 20.0–28.0)
Calcium, Ion: 1.28 mmol/L (ref 1.15–1.40)
HCT: 49 % — ABNORMAL HIGH (ref 36.0–46.0)
Hemoglobin: 16.7 g/dL — ABNORMAL HIGH (ref 12.0–15.0)
O2 Saturation: 71 %
Potassium: 3.9 mmol/L (ref 3.5–5.1)
Sodium: 139 mmol/L (ref 135–145)
TCO2: 29 mmol/L (ref 22–32)
pCO2 arterial: 48.1 mmHg — ABNORMAL HIGH (ref 32–48)
pH, Arterial: 7.365 (ref 7.35–7.45)
pO2, Arterial: 39 mmHg — CL (ref 83–108)

## 2021-08-04 ENCOUNTER — Ambulatory Visit (HOSPITAL_COMMUNITY)
Admit: 2021-08-04 | Discharge: 2021-08-04 | Disposition: A | Discharge status: Home / Self Care | Payer: Medicare Other | Attending: Physician Assistant | Admitting: Physician Assistant

## 2021-08-04 ENCOUNTER — Telehealth (HOSPITAL_COMMUNITY): Payer: Self-pay | Admitting: *Deleted

## 2021-08-04 ENCOUNTER — Encounter (HOSPITAL_COMMUNITY): Payer: Self-pay

## 2021-08-04 VITALS — BP 132/90 | HR 84 | Wt 146.0 lb

## 2021-08-04 DIAGNOSIS — J069 Acute upper respiratory infection, unspecified: Secondary | ICD-10-CM | POA: Diagnosis not present

## 2021-08-04 DIAGNOSIS — Z8249 Family history of ischemic heart disease and other diseases of the circulatory system: Secondary | ICD-10-CM | POA: Diagnosis not present

## 2021-08-04 DIAGNOSIS — N1831 Chronic kidney disease, stage 3a: Secondary | ICD-10-CM | POA: Diagnosis not present

## 2021-08-04 DIAGNOSIS — I428 Other cardiomyopathies: Secondary | ICD-10-CM | POA: Insufficient documentation

## 2021-08-04 DIAGNOSIS — Z79899 Other long term (current) drug therapy: Secondary | ICD-10-CM | POA: Insufficient documentation

## 2021-08-04 DIAGNOSIS — I130 Hypertensive heart and chronic kidney disease with heart failure and stage 1 through stage 4 chronic kidney disease, or unspecified chronic kidney disease: Secondary | ICD-10-CM | POA: Insufficient documentation

## 2021-08-04 DIAGNOSIS — I2510 Atherosclerotic heart disease of native coronary artery without angina pectoris: Secondary | ICD-10-CM | POA: Diagnosis not present

## 2021-08-04 DIAGNOSIS — I5022 Chronic systolic (congestive) heart failure: Secondary | ICD-10-CM | POA: Diagnosis not present

## 2021-08-04 HISTORY — DX: Other cardiomyopathies: I42.8

## 2021-08-04 HISTORY — DX: Unspecified systolic (congestive) heart failure: I50.20

## 2021-08-04 LAB — BASIC METABOLIC PANEL
Anion gap: 9 (ref 5–15)
BUN: 37 mg/dL — ABNORMAL HIGH (ref 8–23)
CO2: 24 mmol/L (ref 22–32)
Calcium: 9.9 mg/dL (ref 8.9–10.3)
Chloride: 106 mmol/L (ref 98–111)
Creatinine, Ser: 1.63 mg/dL — ABNORMAL HIGH (ref 0.44–1.00)
GFR, Estimated: 33 mL/min — ABNORMAL LOW (ref >60)
Glucose, Bld: 109 mg/dL — ABNORMAL HIGH (ref 70–99)
Potassium: 4.1 mmol/L (ref 3.5–5.1)
Sodium: 139 mmol/L (ref 135–145)

## 2021-08-04 LAB — BRAIN NATRIURETIC PEPTIDE: B Natriuretic Peptide: 79.7 pg/mL (ref 0.0–100.0)

## 2021-08-04 MED ORDER — ENTRESTO 24-26 MG PO TABS: 1.0000 | ORAL_TABLET | Freq: Two times a day (BID) | ORAL | 11 refills | Status: DC

## 2021-08-04 MED ORDER — FUROSEMIDE 40 MG PO TABS: 40.0000 mg | ORAL_TABLET | ORAL | 0 refills | Status: DC

## 2021-08-04 NOTE — Telephone Encounter (Signed)
Call attempted to confirm HV TOC appt 08/04/21 @ 9 am.. HIPPA appropriate VM left with callback number.   ? ?Earnestine Leys, BSN, RN ?Heart Failure Nurse Navigator ?Secure Chat Only  ?

## 2021-08-04 NOTE — Patient Instructions (Addendum)
Labs done today. We will contact you only if your labs are abnormal. ? ?START Entresto 24-'26mg'$  (1 tablet) by mouth daily.  ? ?DECREASE Lasix to '40mg'$  (2 tablets) by mouth every Monday , Wednesday and Friday. ? ?No other medication changes were made. Please continue all current medications as prescribed. ? ?Your physician has requested that you have a cardiac MRI. Cardiac MRI uses a computer to create images of your heart as its beating, producing both still and moving pictures of your heart and major blood vessels. This has to be approved through your insurance company prior to scheduling, once approved we will contact you to schedule an appointment.  ? ?Your physician recommends that you schedule a follow-up appointment in: 3 weeks, 8 weeks with our NP/PA Clinic and in 12 weeks with Dr. Haroldine Laws with an echo prior to your appointment, all visits are here in our office.  ? ?Your physician has requested that you have an echocardiogram. Echocardiography is a painless test that uses sound waves to create images of your heart. It provides your doctor with information about the size and shape of your heart and how well your heart?s chambers and valves are working. This procedure takes approximately one hour. There are no restrictions for this procedure. ? ?If you have any questions or concerns before your next appointment please send [place] a message through [place] or call our office at [phone].   ? ?TO LEAVE A MESSAGE FOR THE NURSE SELECT OPTION 2, PLEASE LEAVE A MESSAGE INCLUDING: ?YOUR NAME ?DATE OF BIRTH ?CALL BACK NUMBER ?REASON FOR CALL**this is important as we prioritize the call backs ? ?YOU WILL RECEIVE A CALL BACK THE SAME DAY AS LONG AS YOU CALL BEFORE 4:00 PM ? ? ?Do the following things EVERYDAY: ?Weigh yourself in the morning before breakfast. Write it down and keep it in a log. ?Take your medicines as prescribed ?Eat low salt foods--Limit salt (sodium) to 2000 mg per day.  ?Stay as active as you can  everyday ?Limit all fluids for the day to less than 2 liters ? ? ?At the [facility], you and your health needs are our priority. As part of our continuing mission to provide you with exceptional heart care, we have created designated Provider Care Teams. These Care Teams include your primary Cardiologist (physician) and Advanced Practice Providers (APPs- Physician Assistants and Nurse Practitioners) who all work together to provide you with the care you need, when you need it.  ? ?You may see any of the following providers on your designated Care Team at your next follow up: ?Dr Glori Bickers ?Dr Loralie Champagne ?Darrick Grinder, NP ?Lyda Jester, PA ?Audry Riles, PharmD ? ? ?Please be sure to bring in all your medications bottles to every appointment.  ? ?

## 2021-08-04 NOTE — Addendum Note (Signed)
Encounter addended by: Consuelo Pandy, PA-C on: 08/04/2021 4:44 PM ? Actions taken: Clinical Note Signed

## 2021-08-04 NOTE — Progress Notes (Addendum)
? ? ?HEART & VASCULAR TRANSITION OF CARE CONSULT NOTE  ? ? ? ?Referring Physician: Dr. Marlou Porch ?Primary Care: Kelton Pillar, MD ?Primary Cardiologist: Dr. Johney Frame  ? ?HPI: ?Referred to clinic by Dr. Marlou Porch for heart failure consultation.  ? ?71 y/o AAF w/ h/o HTN controlled w/ medications, HLD and Stage IIIa CKD, recently diagnosed w/ new systolic heart failure/ NICM.  ? ?Has family h/o CHF and SCD. Her brother died at the age of 43 while running/playing. Her mother said he had a "bad heart". Her father also had CHF but older in life and lived until his 18s.  ? ?She had COVID in October 2022 but reports only mild symptoms. ? ?Very active at baseline. Exercises on elliptical and stationary bike. Tends to her garden. Lives at home w/ her husband.Married 50 + years. Has 1 living son. Her other son died 2 years ago at age 33 of metastatic prostate cancer.  ? ?She was in her usual state of health until about 4 weeks ago. Developed URI symptoms w/ cough and nasal congestion. Home COVID test was negative. Treated w/ OTC medications. About 2 weeks later developed SOB, othopnea and wheezing prompting ED evaluation where she was found to be in acute CHF. BNP 461. CXR c/w pulmonary edema. Diuresed w/ IV Lasix.  ? ?2D echo showed reduced LVEF, 35-40%, RV normal.  ? ?Hs trop elevated not c/w acute myocarditis, 15>>19. ? ?R/LHC after diuresis showed mild nonobstructive CAD, normal filling pressures and normal cardiac output.   ? ?Post cath, she was transitioned to oral lasix and GDMT initiated. Referred to [facility] clinic. D/w wt 146 lb.  ? ?Presents today for assessment. Here w/ her husband. Notes improved symptoms. Was back in her garden yesterday and denied any exertional dyspnea. No limitations w/ ADLs. Denies orthopnea/PND. Tolerating medications well. No side effects. No orthostatic symptoms. Checking wt daily at home which has remained stable. Wt stable in clinic today, unchanged at 146 lb.  ? ?We checked Labs today. SCr  elevated post hospital from 1.22>>1.63, K 4.1. BNP down from 461>>79.1.  ? ? ?Cardiac Testing  ? ?2D Echo 07/25/21 ?Left ventricular ejection fraction, by estimation, is 35 to 40%. The left ventricle has ?moderately decreased function. The left ventricle demonstrates global hypokinesis. ?There is moderate left ventricular hypertrophy. Left ventricular diastolic parameters ?are indeterminate. ?1. ?Right ventricular systolic function is normal. The right ventricular size is normal. ?Tricuspid regurgitation signal is inadequate for assessing PA pressure. ?2. ?3. Left atrial size was moderately dilated. ?The mitral valve is abnormal. Mild to moderate mitral valve regurgitation. No evidence ?of mitral stenosis. ?4. ?The aortic valve has an indeterminant number of cusps. There is mild calcification of ?the aortic valve. There is mild thickening of the aortic valve. Aortic valve regurgitation ?is not visualized. No aortic stenosis is present. ?5. ?The inferior vena cava is normal in size with greater than 50% respiratory variability, ?suggesting right atrial pressure of 3 mmHg. ? ? ? ?R/LHC 07/27/21 ?CONCLUSIONS: ?25% distal left main. ?25% ostial LAD. ?25% ostial circumflex. ?Normal dominant right coronary. ?Normal right heart pressures without pulmonary hypertension.  LVEDP 3 mmHg and mean wedge pressure 4 mmHg. ?Global left ventricular hypokinesis consistent with systolic heart failure, compensated. ? ?  ?Fick Cardiac Output 4.51 L/min  ?Fick Cardiac Output Index 2.78 (L/min)/BSA  ? ?PA Systolic Pressure 30 mmHg  ?PA Diastolic Pressure 9 mmHg  ?PA Mean 17 mmHg  ? ?RA A Wave 3 mmHg  ?RA V Wave 1 mmHg  ?RA  Mean 0 mmHg  ?RV Systolic Pressure 26 mmHg  ? ?  ?LV EDP 3 mmHg  ? ?RECOMMENDATIONS: ?Etiology uncertain but illness was preceded by an upper respiratory infection and therefore could be postviral myocarditis.  Father died of heart failure and therefore genetic predisposition is a consideration as well. ?Quadruple therapy  with hopeful full recovery of LV function. ? ? ?Review of Systems: [y] = yes, '[ ]'$  = no  ? ?General: Weight gain '[ ]'$ ; Weight loss '[ ]'$ ; Anorexia '[ ]'$ ; Fatigue '[ ]'$ ; Fever '[ ]'$ ; Chills '[ ]'$ ; Weakness '[ ]'$   ?Cardiac: Chest pain/pressure '[ ]'$ ; Resting SOB '[ ]'$ ; Exertional SOB [ Y]; Orthopnea '[ ]'$ ; Pedal Edema '[ ]'$ ; Palpitations '[ ]'$ ; Syncope '[ ]'$ ; Presyncope '[ ]'$ ; Paroxysmal nocturnal dyspnea'[ ]'$   ?Pulmonary: Cough '[ ]'$ ; Wheezing'[ ]'$ ; Hemoptysis'[ ]'$ ; Sputum '[ ]'$ ; Snoring '[ ]'$   ?GI: Vomiting'[ ]'$ ; Dysphagia'[ ]'$ ; Melena'[ ]'$ ; Hematochezia '[ ]'$ ; Heartburn'[ ]'$ ; Abdominal pain '[ ]'$ ; Constipation '[ ]'$ ; Diarrhea '[ ]'$ ; BRBPR '[ ]'$   ?GU: Hematuria'[ ]'$ ; Dysuria '[ ]'$ ; Nocturia'[ ]'$   ?Vascular: Pain in legs with walking '[ ]'$ ; Pain in feet with lying flat '[ ]'$ ; Non-healing sores '[ ]'$ ; Stroke '[ ]'$ ; TIA '[ ]'$ ; Slurred speech '[ ]'$ ;  ?Neuro: Headaches'[ ]'$ ; Vertigo'[ ]'$ ; Seizures'[ ]'$ ; Paresthesias'[ ]'$ ;Blurred vision '[ ]'$ ; Diplopia '[ ]'$ ; Vision changes '[ ]'$   ?Ortho/Skin: Arthritis '[ ]'$ ; Joint pain '[ ]'$ ; Muscle pain '[ ]'$ ; Joint swelling '[ ]'$ ; Back Pain '[ ]'$ ; Rash '[ ]'$   ?Psych: Depression'[ ]'$ ; Anxiety'[ ]'$   ?Heme: Bleeding problems '[ ]'$ ; Clotting disorders '[ ]'$ ; Anemia '[ ]'$   ?Endocrine: Diabetes '[ ]'$ ; Thyroid dysfunction'[ ]'$  ? ? ?Past Medical History:  ?Diagnosis Date  ? Chronic kidney disease   ? kidney stones  ? Hypercholesteremia   ? Hypertension   ? Nonischemic cardiomyopathy ([place])   ? Systolic heart failure ([place])   ? ? ?Current Outpatient Medications  ?Medication Sig Dispense Refill  ? aspirin EC 81 MG tablet Take 81 mg by mouth daily.    ? atorvastatin (LIPITOR) 20 MG tablet Take 20 mg by mouth at bedtime.     ? Calcium Carbonate-Vit D-Min (CALCIUM 1200 PO) Take 1,200 mg by mouth 2 (two) times daily.    ? carvedilol (COREG) 3.125 MG tablet Take 1 tablet (3.125 mg total) by mouth 2 (two) times daily with a meal. 60 tablet 0  ? dapagliflozin propanediol (FARXIGA) 10 MG TABS tablet Take 1 tablet (10 mg total) by mouth daily before breakfast. 30 tablet 0  ? glucosamine-chondroitin 500-400 MG tablet  Take 1 tablet by mouth 2 (two) times daily.    ? Multiple Vitamin (MULTIVITAMIN WITH MINERALS) TABS Take 1 tablet by mouth daily.    ? sacubitril-valsartan (ENTRESTO) 24-26 MG Take 1 tablet by mouth 2 (two) times daily. 60 tablet 11  ? spironolactone (ALDACTONE) 25 MG tablet Take 0.5 tablets (12.5 mg total) by mouth daily. 30 tablet 0  ? Turmeric (QC TUMERIC COMPLEX) 500 MG CAPS Take 1 capsule by mouth 2 (two) times daily.    ? furosemide (LASIX) 40 MG tablet Take 1 tablet (40 mg total) by mouth 3 (three) times a week. On Monday, Wednesday and Friday 30 tablet 0  ? ?No current facility-administered medications for this encounter.  ? ? ?No Known Allergies ? ?  ?Social History  ? ?Socioeconomic History  ? Marital status: Married  ?  Spouse name: Wolbur  ? Number of children: 1  ? Years of education:  Not on file  ? Highest education level: High school graduate  ?Occupational History  ? 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  ?Tobacco Use  ? Smoking status: Never  ? Smokeless tobacco: Not on file  ?Vaping Use  ? Vaping Use: Never used  ?Substance and Sexual Activity  ? Alcohol use: No  ? Drug use: No  ? Sexual activity: Not on file  ?Other Topics Concern  ? Not on file  ?Social History Narrative  ? Not on file  ? ?Social Determinants of Health  ? ?Financial Resource Strain: Low Risk   ? Difficulty of Paying Living Expenses: Not hard at all  ?Food Insecurity: No Food Insecurity  ? Worried About Charity fundraiser in the Last Year: Never true  ? Ran Out of Food in the Last Year: Never true  ?Transportation Needs: No Transportation Needs  ? Lack of Transportation (Medical): No  ? Lack of Transportation (Non-Medical): No  ?Physical Activity: Not on file  ?Stress: Not on file  ?Social Connections: Not on file  ?Intimate Partner Violence: Not on file  ? ? ?  ?Family History  ?Problem Relation Age of Onset  ? Heart failure Father   ? Sudden Cardiac Death Brother 77  ? ? ?Vitals:  ? 08/04/21 0850  ?BP: 132/90  ?Pulse: 84   ?SpO2: 98%  ?Weight: 66.2 kg (146 lb)  ? ? ?PHYSICAL EXAM: ?General:  Well appearing. No respiratory difficulty ?HEENT: normal ?Neck: supple. no JVD. Carotids 2+ bilat; no bruits. No lymphadenopathy or thryom

## 2021-08-07 NOTE — Discharge Summary (Signed)
Physician Discharge Summary  ?Tina Jenkins ASN:[id number] DOB: 12/16/1950 DOA: 07/24/2021 ? ?PCP: Kelton Pillar, MD ? ?Admit date: 07/24/2021 ?Discharge date: 07/28/2021 ? ?Time spent: 45 minutes ? ?Recommendations for Outpatient Follow-up:  ?TOC heart failure clinic on 4/26, please check BMP at follow-up ?Cardiology Dr. Johney Frame in 1 month ?Chronic thrombocytosis, needs hematology referral ? ? ?Discharge Diagnoses:  ?Principal Problem: ?Acute systolic heart failure ([place]) ?Active Problems: ?  Essential hypertension ?  Acute respiratory failure with hypoxia ([place]) ?  Dyslipidemia ?  Thrombocytosis ? ? ?Discharge Condition: Stable ? ?Diet recommendation: Low-sodium, heart healthy ? ?Filed Weights  ? 07/26/21 1818 07/27/21 0504 07/28/21 0443  ?Weight: 67 kg 66.6 kg 67 kg  ? ? ?History of present illness:  ?71/F with history of hypertension, dyslipidemia, kidney stones, osteoarthritis presented to the ED with dyspnea on exertion X 1 week and orthopnea for few days ?-History of URI 2 weeks prior ?ED Course:  hypoxic with O2 sats of 82%, in respiratory distress, placed on BiPAP, given IV Lasix and nitroglycerin gtt., BNP was 461, troponins were negative, EKG showed nonspecific changes, chest x-ray was notable for pulmonary edema ?-Echo noted EF of 35%, improved with diuretics ?-4/18 noted mild nonobstructive CAD, normal filling pressures ? ?Hospital Course:  ? ?Acute systolic CHF ?Acute hypoxic respiratory failure ?-Presenting with dyspnea on exertion, orthopnea, hypoxia, required BiPAP in the ER, now off ?-Clinically improving, diuresed with IV Lasix she is 4 L negative ?-2D echo noted EF of 35%, global hypokinesis, moderate LVH ?-Cardiac cath noted no significant CAD, TSH was normal ?-Transitioned to Lasix, Aldactone 12.5 mg daily and Farxiga 10 Mg daily at discharge ?-Follow-up with TOC clinic in 1 week, repeat echo in 3 months, if still low consider cardiac MRI ?  ?Essential hypertension ?-Stable, improved,  Lotensin/HCTZ discontinued, meds as above ?  ?Dyslipidemia ?-Continue statin ?  ?Hyperglycemia ?-Hemoglobin A1c is 5.9 consistent with borderline diabetes ?-Farxiga added ?  ?Mildly elevated high-sensitivity troponin ?-Secondary to CHF, clinically no evidence of ACS ?  ?Chronic thrombocytosis ?- chronic thrombocytosis dating back at least 10 years, needs hematology referral to rule out P vera or other myeloproliferative disorder ? ?Consultations: ?Cardiology ? ?Discharge Exam: ?Vitals:  ? 07/28/21 0443 07/28/21 0838  ?BP: 107/78 113/68  ?Pulse:  97  ?Resp: 18 18  ?Temp: 97.8 ?F (36.6 ?C) 97.7 ?F (36.5 ?C)  ?SpO2: 95% 95%  ? ?General exam: Pleasant, no distress ?HEENT: No JVD ?CVs: S1-S2, regular rate rhythm ?Lungs: Clear today ?Abdomen: Soft, nontender, bowel sounds present ?Extremities: No edema  ?Skin: No rashes ?Psychiatry: Judgement and insight appear normal. Mood & affect appropriate. ? ? ?Discharge Instructions ? ? ?Discharge Instructions   ? ? Diet - low sodium heart healthy   Complete by: As directed ?  ? Diet Carb Modified   Complete by: As directed ?  ? Increase activity slowly   Complete by: As directed ?  ? ?  ? ?Allergies as of 07/28/2021   ?No Known Allergies ?  ? ?  ?Medication List  ?  ? ?STOP taking these medications   ? ?benazepril-hydrochlorthiazide 20-12.5 MG tablet ?Commonly known as: LOTENSIN HCT ?  ?hydrochlorothiazide 25 MG tablet ?Commonly known as: HYDRODIURIL ?  ? ?  ? ?TAKE these medications   ? ?aspirin EC 81 MG tablet ?Take 81 mg by mouth daily. ?  ?atorvastatin 20 MG tablet ?Commonly known as: LIPITOR ?Take 20 mg by mouth at bedtime. ?  ?CALCIUM 1200 PO ?Take 1,200 mg by mouth 2 (  two) times daily. ?  ?carvedilol 3.125 MG tablet ?Commonly known as: COREG ?Take 1 tablet (3.125 mg total) by mouth 2 (two) times daily with a meal. ?  ?Farxiga 10 MG Tabs tablet ?Generic drug: dapagliflozin propanediol ?Take 1 tablet (10 mg total) by mouth daily before breakfast. ?  ?glucosamine-chondroitin  500-400 MG tablet ?Take 1 tablet by mouth 2 (two) times daily. ?  ?multivitamin with minerals Tabs tablet ?Take 1 tablet by mouth daily. ?  ?spironolactone 25 MG tablet ?Commonly known as: ALDACTONE ?Take 0.5 tablets (12.5 mg total) by mouth daily. ?  ? ?Lasix 40 Mg daily  ? ?No Known Allergies ? Follow-up Information   ? ? [facility]. Go in 7 day(s).   ?Specialty: Cardiology ?Why: Follow up hospital ?Please bring medicaion list ?Free valet parking, Entrance C, [employer]. ?Contact information: ?[address] ?[id number] mc ?[place] ?[phone] ? ?  ?  ? ? Kelton Pillar, MD. Schedule an appointment as soon as possible for a visit in 1 week(s).   ?Specialty: Family Medicine ?Contact information: ?[address] ?[place] [address] ?[phone] ? ? ?  ?  ? ?  ?  ? ?  ? ? ? ?The results of significant diagnostics from this hospitalization (including imaging, microbiology, ancillary and laboratory) are listed below for reference.   ? ?Significant Diagnostic Studies: ?CARDIAC CATHETERIZATION ? ?Result Date: 07/27/2021 ?CONCLUSIONS: 25% distal left main. 25% ostial LAD. 25% ostial circumflex. Normal dominant right coronary. Normal right heart pressures without pulmonary hypertension.  LVEDP 3 mmHg and mean wedge pressure 4 mmHg. Global left ventricular hypokinesis consistent with systolic heart failure, compensated. RECOMMENDATIONS: Etiology uncertain but illness was preceded by an upper respiratory infection and therefore could be postviral myocarditis.  Father died of heart failure and therefore genetic predisposition is a consideration as well. Quadruple therapy with hopeful full recovery of LV function. ? ?DG Chest Portable 1 View ? ?Result Date: 07/24/2021 ?CLINICAL DATA:  71 year old female with shortness of breath at 0300 hours today. Abnormal pulmonary auscultation. EXAM: PORTABLE CHEST 1 VIEW COMPARISON:  CT  Abdomen and Pelvis 05/24/2021. Chest radiographs 06/10/2010. FINDINGS: Portable AP upright view at 0443 hours. Lower lung volumes compared to 2012. Similar mediastinal contour, no definite cardiomegaly. Diffuse pulmonary interstitial opacity, fairly symmetric. No pneumothorax or consolidation. No obvious pleural effusion. No acute osseous abnormality identified. Negative visible bowel gas. IMPRESSION: Diffuse pulmonary interstitial opacity most suggestive of acute pulmonary edema. Extensive viral/atypical pneumonia felt less likely. Electronically Signed   By: Genevie Ann M.D.   On: 07/24/2021 04:59  ? ?ECHOCARDIOGRAM COMPLETE ? ?Result Date: 07/25/2021 ?   ECHOCARDIOGRAM REPORT   Patient Name:   Tina Jenkins Date of Exam: 07/25/2021 Medical Rec #:  [id number]      Height:       59.0 in Accession #:    [id number]     Weight:       147.9 lb Date of Birth:  07/31/1950       BSA:          1.622 m? Patient Age:    34 years       BP:           116/75 mmHg Patient Gender: F              HR:           88 bpm. Exam Location:  Inpatient Procedure: 2D Echo, Cardiac Doppler and  Color Doppler Indications:    CHF-acute diastolic  History:        Patient has no prior history of Echocardiogram examinations.                 Signs/Symptoms:Shortness of Breath; Risk Factors:Hypertension                 and Dyslipidemia. DOE.  Sonographer:    Clayton Lefort RDCS (AE) Referring Phys: [place]  Sonographer Comments: Suboptimal subcostal window. IMPRESSIONS  1. Left ventricular ejection fraction, by estimation, is 35 to 40%. The left ventricle has moderately decreased function. The left ventricle demonstrates global hypokinesis. There is moderate left ventricular hypertrophy. Left ventricular diastolic parameters are indeterminate.  2. Right ventricular systolic function is normal. The right ventricular size is normal. Tricuspid regurgitation signal is inadequate for assessing PA pressure.  3. Left atrial size was moderately dilated.  4.  The mitral valve is abnormal. Mild to moderate mitral valve regurgitation. No evidence of mitral stenosis.  5. The aortic valve has an indeterminant number of cusps. There is mild calcification of the aortic valve. Ther

## 2021-08-23 ENCOUNTER — Other Ambulatory Visit (HOSPITAL_COMMUNITY): Payer: Self-pay

## 2021-08-23 MED ORDER — SPIRONOLACTONE 25 MG PO TABS: 12.5000 mg | ORAL_TABLET | Freq: Every day | ORAL | 11 refills | Status: DC

## 2021-08-23 MED ORDER — DAPAGLIFLOZIN PROPANEDIOL 10 MG PO TABS: 10.0000 mg | ORAL_TABLET | Freq: Every day | ORAL | 11 refills | Status: DC

## 2021-08-23 MED ORDER — CARVEDILOL 3.125 MG PO TABS: 3.1250 mg | ORAL_TABLET | Freq: Two times a day (BID) | ORAL | 11 refills | Status: DC

## 2021-08-24 ENCOUNTER — Telehealth (HOSPITAL_COMMUNITY): Payer: Self-pay | Admitting: Pharmacy Technician

## 2021-08-24 ENCOUNTER — Other Ambulatory Visit (HOSPITAL_COMMUNITY): Payer: Self-pay

## 2021-08-24 NOTE — Telephone Encounter (Signed)
Advanced Heart Failure Patient Advocate Encounter ? ?The patient was approved for a [employer] that will help cover the cost of Delene Loll, Farxiga. Total amount awarded, $10,000. Eligibility, 07/25/21 - 07/25/22. ? ?ID [id number] ? ?BIN [id number] ? ?PCN [id number] ? ?Group [id number] ? ?Emailed patient a copy of the grant information. ? ?Charlann Boxer, CPhT ? ?

## 2021-08-25 ENCOUNTER — Other Ambulatory Visit (HOSPITAL_COMMUNITY): Payer: Self-pay | Admitting: Internal Medicine

## 2021-08-26 ENCOUNTER — Telehealth (HOSPITAL_COMMUNITY): Payer: Self-pay | Admitting: Pharmacy Technician

## 2021-08-26 ENCOUNTER — Other Ambulatory Visit (HOSPITAL_COMMUNITY): Payer: Self-pay

## 2021-08-26 NOTE — Telephone Encounter (Signed)
Advanced Heart Failure Patient Advocate Encounter  Received a call from [facility] that the grant was not going through. When I run a test claim, the grant is going through and the co-pay is $0.  Called and spoke with [facility]. They have an internal block on being able to bill secondary claims on Medicare insurances. It effects some locations and not others. Can't say why. Apparently the patient picked up the medication and paid out of pocket. Called to speak with the patient and suggest that we transfer her medications to [facility] outpatient. We can mail them to her home from there. Advised the patient to return the call to discuss further.  Charlann Boxer, CPhT

## 2021-08-30 ENCOUNTER — Telehealth (HOSPITAL_COMMUNITY): Payer: Self-pay | Admitting: Pharmacist

## 2021-08-30 MED ORDER — SPIRONOLACTONE 25 MG PO TABS: 25.0000 mg | ORAL_TABLET | Freq: Every day | ORAL | 11 refills | Status: DC

## 2021-08-30 NOTE — Telephone Encounter (Signed)
Received call from patient. She was concerned that she had accidentally been taking spironolactone 25 mg daily instead of 12.5 mg daily as prescribed since 07/27/21. BMET from 08/04/21 showed potassium level was stable at 4.1. Instructed patient to continue taking spironolactone 25 mg daily. Updated prescription sent to [facility]. She has follow up with HF Clinic this Friday (09/03/21). Will repeat BMET at that visit and make further changes as needed. Patient expressed understanding.   Audry Riles, PharmD, BCPS, BCCP, CPP Heart Failure Clinic Pharmacist [phone]

## 2021-09-01 NOTE — Progress Notes (Signed)
ADVANCED HF CLINIC CONSULT NOTE  PCP: Kelton Pillar, MD Primary Cardiologist: Dr. Johney Frame  HF Cardiologist: Dr. Haroldine Laws   HPI: Tina Jenkins is a 71 y.o. AAF w/ h/o HTN controlled w/ medications, HLD, CKD IIIa, and recently diagnosed w/ new systolic heart failure/ NICM.    Has family h/o CHF and SCD. Her brother died at the age of 62 while running/playing. Her mother said he had a "bad heart". Her father also had CHF but older in life and lived until his 20s.    She had COVID in October 2022 but reports only mild symptoms.   Admitted 4/23 with new acute CHF. Had URI 2 weeks prior, COVID negative at that time. Diuresed w/ IV Lasix. Echo showed reduced LVEF, 35-40%, RV normal. Hs trop elevated not c/w acute myocarditis, 15>>19. Underwent R/LHC showing mild nonobstructive CAD, normal filling pressures and normal cardiac output.  GDMT initiated, referred to [facility], and discharged home with weight 146 lbs.    Seen in [facility] 4/23, feeling well. Stable NYHA I-II symptoms, euvolemic. Entresto started and Lasix changed to PRN.   Today she presents, from [facility], to establish with AHF clinic. Overall feeling fine. She has no dyspnea with gardening and working in the yard. Denies palpitations, CP, dizziness, edema, or PND/Orthopnea. Appetite ok. No fever or chills. Weight at home 143-145 pounds. Taking all medications. Married 50 + years. Has 1 living son. Her other son died 2 years ago at age 31 of metastatic prostate cancer.    Cardiac Testing  - Echo (4/23): EF 35-40%, LV moderately decreased with global HK, moderate LVH, RV ok, mild to moderate MR  - R/LHC (4/23):  CO/CI (Fick) 4.51/2.78 L/min PA: 30/9 (mean 17) mmHg RA A: 3 mmHg RA V:1 mmHg RA mean (0) RVSP: 26 mmgHg LVEDP: 3 mmHg  25% distal left main. 25% ostial LAD. 25% ostial circumflex. Normal dominant right coronary. Normal right heart pressures without pulmonary hypertension.  LVEDP 3 mmHg and mean wedge pressure 4 mmHg. Global  left ventricular hypokinesis consistent with systolic heart failure, compensated. Etiology uncertain but illness was preceded by an upper respiratory infection and therefore could be postviral myocarditis.  Father died of heart failure and therefore genetic predisposition is a consideration as well. Quadruple therapy with hopeful full recovery of LV function.   Review of Systems: [y] = yes, '[ ]'$  = no    General: Weight gain '[ ]'$ ; Weight loss '[ ]'$ ; Anorexia '[ ]'$ ; Fatigue '[ ]'$ ; Fever '[ ]'$ ; Chills '[ ]'$ ; Weakness '[ ]'$   Cardiac: Chest pain/pressure '[ ]'$ ; Resting SOB '[ ]'$ ; Exertional SOB [ Y]; Orthopnea '[ ]'$ ; Pedal Edema '[ ]'$ ; Palpitations '[ ]'$ ; Syncope '[ ]'$ ; Presyncope '[ ]'$ ; Paroxysmal nocturnal dyspnea'[ ]'$   Pulmonary: Cough '[ ]'$ ; Wheezing'[ ]'$ ; Hemoptysis'[ ]'$ ; Sputum '[ ]'$ ; Snoring '[ ]'$   GI: Vomiting'[ ]'$ ; Dysphagia'[ ]'$ ; Melena'[ ]'$ ; Hematochezia '[ ]'$ ; Heartburn'[ ]'$ ; Abdominal pain '[ ]'$ ; Constipation '[ ]'$ ; Diarrhea '[ ]'$ ; BRBPR '[ ]'$   GU: Hematuria'[ ]'$ ; Dysuria '[ ]'$ ; Nocturia'[ ]'$   Vascular: Pain in legs with walking '[ ]'$ ; Pain in feet with lying flat '[ ]'$ ; Non-healing sores '[ ]'$ ; Stroke '[ ]'$ ; TIA '[ ]'$ ; Slurred speech '[ ]'$ ;  Neuro: Headaches'[ ]'$ ; Vertigo'[ ]'$ ; Seizures'[ ]'$ ; Paresthesias'[ ]'$ ;Blurred vision '[ ]'$ ; Diplopia '[ ]'$ ; Vision changes '[ ]'$   Ortho/Skin: Arthritis '[ ]'$ ; Joint pain '[ ]'$ ; Muscle pain '[ ]'$ ; Joint swelling '[ ]'$ ; Back Pain '[ ]'$ ; Rash '[ ]'$   Psych: Depression'[ ]'$ ; Anxiety'[ ]'$   Heme: Bleeding problems '[ ]'$ ; Clotting disorders '[ ]'$ ; Anemia '[ ]'$   Endocrine: Diabetes '[ ]'$ ; Thyroid dysfunction'[ ]'$   Past Medical History:  Diagnosis Date   Chronic kidney disease    kidney stones   Hypercholesteremia    Hypertension    Nonischemic cardiomyopathy (HCC)    Systolic heart failure (HCC)    Current Outpatient Medications  Medication Sig Dispense Refill   aspirin EC 81 MG tablet Take 81 mg by mouth daily.     atorvastatin (LIPITOR) 20 MG tablet Take 20 mg by mouth at bedtime.      Calcium Carbonate-Vit D-Min (CALCIUM 1200 PO) Take 1,200 mg by mouth 2  (two) times daily.     carvedilol (COREG) 3.125 MG tablet Take 1 tablet (3.125 mg total) by mouth 2 (two) times daily with a meal. 60 tablet 11   dapagliflozin propanediol (FARXIGA) 10 MG TABS tablet Take 1 tablet (10 mg total) by mouth daily before breakfast. 30 tablet 11   furosemide (LASIX) 40 MG tablet Take 40 mg by mouth as needed. With a weight gain of 3 lbs or more.     glucosamine-chondroitin 500-400 MG tablet Take 1 tablet by mouth 2 (two) times daily.     Multiple Vitamin (MULTIVITAMIN WITH MINERALS) TABS Take 1 tablet by mouth daily.     sacubitril-valsartan (ENTRESTO) 24-26 MG Take 1 tablet by mouth 2 (two) times daily. 60 tablet 11   spironolactone (ALDACTONE) 25 MG tablet Take 1 tablet (25 mg total) by mouth daily. 30 tablet 11   Turmeric 500 MG CAPS Take 1 capsule by mouth 2 (two) times daily.     No current facility-administered medications for this encounter.   No Known Allergies  Social History   Socioeconomic History   Marital status: Married    Spouse name: Chief Strategy Officer   Number of children: 1   Years of education: Not on file   Highest education level: High school graduate  Occupational History   Occupation: Retired    Comment: Ins.  Tobacco Use   Smoking status: Never   Smokeless tobacco: Not on file  Vaping Use   Vaping Use: Never used  Substance and Sexual Activity   Alcohol use: No   Drug use: No   Sexual activity: Not on file  Other Topics Concern   Not on file  Social History Narrative   Not on file   Social Determinants of Health   Financial Resource Strain: Low Risk    Difficulty of Paying Living Expenses: Not hard at all  Food Insecurity: No Food Insecurity   Worried About Charity fundraiser in the Last Year: Never true   [place] in the Last Year: Never true  Transportation Needs: No Transportation Needs   Lack of Transportation (Medical): No   Lack of Transportation (Non-Medical): No  Physical Activity: Not on file  Stress: Not on  file  Social Connections: Not on file  Intimate Partner Violence: Not on file   Family History  Problem Relation Age of Onset   Heart failure Father    Sudden Cardiac Death Brother 12   BP 132/90   Pulse 77   Wt 65.8 kg (145 lb)   SpO2 98%   BMI 29.29 kg/m   Wt Readings from Last 3 Encounters:  09/03/21 65.8 kg (145 lb)  08/04/21 66.2 kg (146 lb)  07/28/21 67 kg (147 lb 11.2 oz)   PHYSICAL EXAM: General:  NAD. No resp difficulty HEENT: Normal Neck:  Supple. No JVD. Carotids 2+ bilat; no bruits. No lymphadenopathy or thryomegaly appreciated. Cor: PMI nondisplaced. Regular rate & rhythm. No rubs, gallops or murmurs. Lungs: Clear Abdomen: Soft, nontender, nondistended. No hepatosplenomegaly. No bruits or masses. Good bowel sounds. Extremities: No cyanosis, clubbing, rash, edema Neuro: Alert & oriented x 3, cranial nerves grossly intact. Moves all 4 extremities w/o difficulty. Affect pleasant.  ASSESSMENT & PLAN: Chronic Systolic Heart Failure - New diagnosis. NICM - Echo (4/23): EF 35-40%, RV normal - L/RHC (4/23): w/ mild obstructive CAD, normal filling pressures and normal CO  - Etiology uncertain, but acute CHF exacerbation was preceded by an upper respiratory infection and therefore could be postviral myocarditis. Doubt acute given Hs trop trend but did have COVID 10/22.  - Also ? Familial CM given family history  - cMRI scheduled for further evaluation to exclude myocarditis and infiltrative CM  - NYHAI-II. Euvolemic on exam. - Increase Coreg to 6.25 mg bid. - Continue Entresto 24-26 mg bid.  - Continue Farxiga 10 mg daily.  - Continue spironolactone 12.5 mg daily.  - Continue Lasix PRN - Continue med optimization until repeat echo. If EF normalizes, she can be referred back to cardiology for ongoing management.  -  Labs today.   2. Hypertension  - Increase carvedilol as above. - Labs today.   3. Stage IIIa CKD  - Slight bump in SCr on BMP today, 1.22>>1.6. BNP  now normal at 79. - Continue Farxiga 10 mg daily  - Labs today.   Follow up in 4 weeks with APP and 8 weeks with Dr. Haroldine Laws + echo.  Allena Katz, FNP-BC 09/03/21

## 2021-09-03 ENCOUNTER — Encounter (HOSPITAL_COMMUNITY): Payer: Self-pay

## 2021-09-03 ENCOUNTER — Other Ambulatory Visit (HOSPITAL_COMMUNITY): Payer: Self-pay

## 2021-09-03 ENCOUNTER — Ambulatory Visit (HOSPITAL_COMMUNITY)
Admission: RE | Admit: 2021-09-03 | Discharge: 2021-09-03 | Disposition: A | Discharge status: Home / Self Care | Payer: Medicare Other | Source: Ambulatory Visit | Attending: Family Medicine | Admitting: Family Medicine

## 2021-09-03 ENCOUNTER — Inpatient Hospital Stay: Payer: Medicare Other | Admitting: Hematology & Oncology

## 2021-09-03 ENCOUNTER — Encounter: Payer: Self-pay | Admitting: Hematology & Oncology

## 2021-09-03 ENCOUNTER — Inpatient Hospital Stay: Payer: Medicare Other | Attending: Hematology & Oncology

## 2021-09-03 ENCOUNTER — Telehealth (HOSPITAL_COMMUNITY): Payer: Self-pay | Admitting: Pharmacy Technician

## 2021-09-03 VITALS — BP 132/90 | HR 77 | Wt 145.0 lb

## 2021-09-03 VITALS — BP 130/64 | HR 76 | Temp 98.3°F | Resp 20 | Wt 147.4 lb

## 2021-09-03 DIAGNOSIS — I2510 Atherosclerotic heart disease of native coronary artery without angina pectoris: Secondary | ICD-10-CM | POA: Insufficient documentation

## 2021-09-03 DIAGNOSIS — I10 Essential (primary) hypertension: Secondary | ICD-10-CM | POA: Diagnosis not present

## 2021-09-03 DIAGNOSIS — D75839 Thrombocytosis, unspecified: Secondary | ICD-10-CM | POA: Diagnosis present

## 2021-09-03 DIAGNOSIS — I130 Hypertensive heart and chronic kidney disease with heart failure and stage 1 through stage 4 chronic kidney disease, or unspecified chronic kidney disease: Secondary | ICD-10-CM | POA: Diagnosis not present

## 2021-09-03 DIAGNOSIS — Z7982 Long term (current) use of aspirin: Secondary | ICD-10-CM | POA: Diagnosis not present

## 2021-09-03 DIAGNOSIS — I428 Other cardiomyopathies: Secondary | ICD-10-CM | POA: Insufficient documentation

## 2021-09-03 DIAGNOSIS — Z79899 Other long term (current) drug therapy: Secondary | ICD-10-CM | POA: Insufficient documentation

## 2021-09-03 DIAGNOSIS — N189 Chronic kidney disease, unspecified: Secondary | ICD-10-CM | POA: Diagnosis not present

## 2021-09-03 DIAGNOSIS — I5022 Chronic systolic (congestive) heart failure: Secondary | ICD-10-CM | POA: Diagnosis present

## 2021-09-03 DIAGNOSIS — D751 Secondary polycythemia: Secondary | ICD-10-CM | POA: Insufficient documentation

## 2021-09-03 DIAGNOSIS — Z7984 Long term (current) use of oral hypoglycemic drugs: Secondary | ICD-10-CM

## 2021-09-03 DIAGNOSIS — N1831 Chronic kidney disease, stage 3a: Secondary | ICD-10-CM | POA: Diagnosis not present

## 2021-09-03 DIAGNOSIS — E785 Hyperlipidemia, unspecified: Secondary | ICD-10-CM | POA: Insufficient documentation

## 2021-09-03 DIAGNOSIS — I5020 Unspecified systolic (congestive) heart failure: Secondary | ICD-10-CM | POA: Diagnosis not present

## 2021-09-03 DIAGNOSIS — Z8616 Personal history of COVID-19: Secondary | ICD-10-CM | POA: Diagnosis not present

## 2021-09-03 LAB — CBC WITH DIFFERENTIAL (CANCER CENTER ONLY)
Abs Immature Granulocytes: 0.05 10*3/uL (ref 0.00–0.07)
Basophils Absolute: 0 10*3/uL (ref 0.0–0.1)
Basophils Relative: 1 %
Eosinophils Absolute: 0.2 10*3/uL (ref 0.0–0.5)
Eosinophils Relative: 5 %
HCT: 46.6 % — ABNORMAL HIGH (ref 36.0–46.0)
Hemoglobin: 14.2 g/dL (ref 12.0–15.0)
Immature Granulocytes: 1 %
Lymphocytes Relative: 25 %
Lymphs Abs: 1.1 10*3/uL (ref 0.7–4.0)
MCH: 26.5 pg (ref 26.0–34.0)
MCHC: 30.5 g/dL (ref 30.0–36.0)
MCV: 86.9 fL (ref 80.0–100.0)
Monocytes Absolute: 0.6 10*3/uL (ref 0.1–1.0)
Monocytes Relative: 14 %
Neutro Abs: 2.3 10*3/uL (ref 1.7–7.7)
Neutrophils Relative %: 54 %
Platelet Count: 602 10*3/uL — ABNORMAL HIGH (ref 150–400)
RBC: 5.36 MIL/uL — ABNORMAL HIGH (ref 3.87–5.11)
RDW: 16.8 % — ABNORMAL HIGH (ref 11.5–15.5)
WBC Count: 4.2 10*3/uL (ref 4.0–10.5)
nRBC: 0 % (ref 0.0–0.2)

## 2021-09-03 LAB — CMP (CANCER CENTER ONLY)
ALT: 17 U/L (ref 0–44)
AST: 22 U/L (ref 15–41)
Albumin: 4.2 g/dL (ref 3.5–5.0)
Alkaline Phosphatase: 66 U/L (ref 38–126)
Anion gap: 8 (ref 5–15)
BUN: 33 mg/dL — ABNORMAL HIGH (ref 8–23)
CO2: 27 mmol/L (ref 22–32)
Calcium: 10 mg/dL (ref 8.9–10.3)
Chloride: 108 mmol/L (ref 98–111)
Creatinine: 1.39 mg/dL — ABNORMAL HIGH (ref 0.44–1.00)
GFR, Estimated: 40 mL/min — ABNORMAL LOW (ref >60)
Glucose, Bld: 101 mg/dL — ABNORMAL HIGH (ref 70–99)
Potassium: 4.3 mmol/L (ref 3.5–5.1)
Sodium: 143 mmol/L (ref 135–145)
Total Bilirubin: 0.5 mg/dL (ref 0.3–1.2)
Total Protein: 6.8 g/dL (ref 6.5–8.1)

## 2021-09-03 LAB — BASIC METABOLIC PANEL
Anion gap: 9 (ref 5–15)
BUN: 27 mg/dL — ABNORMAL HIGH (ref 8–23)
CO2: 21 mmol/L — ABNORMAL LOW (ref 22–32)
Calcium: 9.8 mg/dL (ref 8.9–10.3)
Chloride: 108 mmol/L (ref 98–111)
Creatinine, Ser: 1.38 mg/dL — ABNORMAL HIGH (ref 0.44–1.00)
GFR, Estimated: 41 mL/min — ABNORMAL LOW (ref >60)
Glucose, Bld: 99 mg/dL (ref 70–99)
Potassium: 4.1 mmol/L (ref 3.5–5.1)
Sodium: 138 mmol/L (ref 135–145)

## 2021-09-03 LAB — RETICULOCYTES
Immature Retic Fract: 10.7 % (ref 2.3–15.9)
RBC.: 5.3 MIL/uL — ABNORMAL HIGH (ref 3.87–5.11)
Retic Count, Absolute: 82.2 10*3/uL (ref 19.0–186.0)
Retic Ct Pct: 1.6 % (ref 0.4–3.1)

## 2021-09-03 LAB — VITAMIN B12: Vitamin B-12: 685 pg/mL (ref 180–914)

## 2021-09-03 LAB — SAVE SMEAR(SSMR), FOR PROVIDER SLIDE REVIEW

## 2021-09-03 LAB — FERRITIN: Ferritin: 25 ng/mL (ref 11–307)

## 2021-09-03 LAB — LACTATE DEHYDROGENASE: LDH: 195 U/L — ABNORMAL HIGH (ref 98–192)

## 2021-09-03 MED ORDER — DAPAGLIFLOZIN PROPANEDIOL 10 MG PO TABS
10.0000 mg | ORAL_TABLET | Freq: Every day | ORAL | 11 refills | Status: DC
Start: 2021-09-03 — End: 2022-09-16
  Filled 2021-09-03 – 2021-12-15 (×3): qty 30, 30d supply, fill #0
  Filled 2022-01-17: qty 30, 30d supply, fill #1
  Filled 2022-02-23: qty 30, 30d supply, fill #2
  Filled 2022-03-21: qty 30, 30d supply, fill #3
  Filled 2022-04-20: qty 30, 30d supply, fill #4
  Filled 2022-04-27: qty 30, 30d supply, fill #0
  Filled 2022-05-24: qty 30, 30d supply, fill #1
  Filled 2022-06-20: qty 30, 30d supply, fill #2
  Filled 2022-07-19: qty 30, 30d supply, fill #3
  Filled 2022-08-16 – 2022-08-17 (×2): qty 30, 30d supply, fill #4

## 2021-09-03 MED ORDER — CARVEDILOL 6.25 MG PO TABS: 6.2500 mg | ORAL_TABLET | Freq: Two times a day (BID) | ORAL | 3 refills | Status: DC

## 2021-09-03 MED ORDER — ENTRESTO 24-26 MG PO TABS
1.0000 | ORAL_TABLET | Freq: Two times a day (BID) | ORAL | 11 refills | Status: DC
  Filled 2021-09-03 – 2021-10-01 (×3): qty 60, 30d supply, fill #0
  Filled 2021-10-25: qty 60, 30d supply, fill #1
  Filled 2021-11-30: qty 60, 30d supply, fill #2
  Filled 2022-01-03: qty 60, 30d supply, fill #3
  Filled 2022-01-31: qty 60, 30d supply, fill #4
  Filled 2022-03-07: qty 60, 30d supply, fill #0

## 2021-09-03 NOTE — Patient Instructions (Signed)
Thank you for coming in today  Labs were done today, if any labs are abnormal the clinic will call you No news is good news  INCREASE Coreg to 6.25 mg 1 tablet daily   Your physician recommends that you schedule a follow-up appointment in:  Keep follow up appointment  At the [facility], you and your health needs are our priority. As part of our continuing mission to provide you with exceptional heart care, we have created designated Provider Care Teams. These Care Teams include your primary Cardiologist (physician) and Advanced Practice Providers (APPs- Physician Assistants and Nurse Practitioners) who all work together to provide you with the care you need, when you need it.   You may see any of the following providers on your designated Care Team at your next follow up: Dr Glori Bickers Dr Haynes Kerns, NP Lyda Jester, [place] [facility] [place], [place] Audry Riles, PharmD   Please be sure to bring in all your medications bottles to every appointment.   If you have any questions or concerns before your next appointment please send [place] a message through [facility] or call our office at [phone].    TO LEAVE A MESSAGE FOR THE NURSE SELECT OPTION 2, PLEASE LEAVE A MESSAGE INCLUDING: YOUR NAME DATE OF BIRTH CALL BACK NUMBER REASON FOR CALL**this is important as we prioritize the call backs  YOU WILL RECEIVE A CALL BACK THE SAME DAY AS LONG AS YOU CALL BEFORE 4:00 PM

## 2021-09-03 NOTE — Progress Notes (Signed)
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  HPI: Tina Jenkins is a very nice 71 year old African-American female.  She comes in with her husband.  They are both very very nice.  It was a whole lot of fun talking with them.  She has been fairly healthy.  Recently, she been having some cardiac issues.  She has been seen cardiology.  I think she had a recent cardiac cath that was relatively unremarkable.  However, she has been noted to have a markedly elevated platelet count.  Back on 07/24/2021, her white cell count was 7.7.  Hemoglobin 14.7.  Platelet count 852,000.  MCV was 86.  Going back to 2014, her hemoglobin was 12.8.  Her platelet count was 595,000.  Back in 2012, her platelet count was 368,000.  She has been on baby aspirin.  She is feeling okay.  There is no pain in her hands or feet.  She has had no headache.  There is no weight loss or weight gain.  She has had no change in bowel or bladder habits.  She is up-to-date with her mammogram and colonoscopy.  There is no history of sickle cell in the family.  She has had no rashes.  She does not smoke.  She does not have any alcoholic beverages.  She was kindly referred to the [facility] because of the abnormal labs.  Currently, I would say performance status is probably ECOG 1.   Past Medical History:  Diagnosis Date   Chronic kidney disease    kidney stones   Hypercholesteremia    Hypertension    Nonischemic cardiomyopathy ([place])    Systolic heart failure ([place])   :   Past Surgical History:  Procedure Laterality Date   ABDOMINAL HYSTERECTOMY  1998   FRACTURE SURGERY  2011, 06/2010   left femur   RIGHT/LEFT HEART CATH AND CORONARY ANGIOGRAPHY N/A 07/27/2021   Procedure: RIGHT/LEFT HEART CATH AND  CORONARY ANGIOGRAPHY;  Surgeon: Belva Crome, MD;  Location: [facility];  Service: Cardiovascular;  Laterality: N/A;  :   Current Outpatient Medications:    aspirin EC 81 MG tablet, Take 81 mg by mouth daily., Disp: , Rfl:    atorvastatin (LIPITOR) 20 MG tablet, Take 20 mg by mouth at bedtime. , Disp: , Rfl:    Calcium Carbonate-Vit D-Min (CALCIUM 1200 PO), Take 1,200 mg by mouth 2 (two) times daily., Disp: , Rfl:    carvedilol (COREG) 6.25 MG tablet, Take 1 tablet (6.25 mg total) by mouth 2 (two) times daily., Disp: 180 tablet, Rfl: 3   dapagliflozin propanediol (FARXIGA) 10 MG TABS tablet, Take 1 tablet (10 mg total) by mouth daily before breakfast., Disp: 30 tablet, Rfl: 11   furosemide (LASIX) 40 MG tablet, Take 40 mg by mouth as needed. With a weight gain of 3 lbs or more., Disp: , Rfl:    glucosamine-chondroitin 500-400 MG tablet, Take 1 tablet by mouth 2 (two) times daily., Disp: , Rfl:    Multiple Vitamin (MULTIVITAMIN WITH MINERALS) TABS, Take 1 tablet by mouth daily., Disp: , Rfl:    sacubitril-valsartan (ENTRESTO) 24-26 MG, Take 1 tablet by mouth 2 (two) times daily., Disp: 60 tablet, Rfl: 11   spironolactone (ALDACTONE) 25 MG tablet, Take 1 tablet (25 mg total)  by mouth daily., Disp: 30 tablet, Rfl: 11   Turmeric 500 MG CAPS, Take 1 capsule by mouth 2 (two) times daily., Disp: , Rfl: :  :  No Known Allergies:   Family History  Problem Relation Age of Onset   Heart failure Father    Sudden Cardiac Death Brother 6  :   Social History   Socioeconomic History   Marital status: Married    Spouse name: Chief Strategy Officer   Number of children: 1   Years of education: Not on file   Highest education level: High school graduate  Occupational History   Occupation: Retired    Comment: Ins.  Tobacco Use   Smoking status: Never   Smokeless tobacco: Not on file  Vaping Use   Vaping Use: Never used  Substance and Sexual Activity   Alcohol use: No   Drug use: No   Sexual  activity: Not on file  Other Topics Concern   Not on file  Social History Narrative   Not on file   Social Determinants of Health   Financial Resource Strain: Low Risk    Difficulty of Paying Living Expenses: Not hard at all  Food Insecurity: No Food Insecurity   Worried About Charity fundraiser in the Last Year: Never true   [place] in the Last Year: Never true  Transportation Needs: No Transportation Needs   Lack of Transportation (Medical): No   Lack of Transportation (Non-Medical): No  Physical Activity: Not on file  Stress: Not on file  Social Connections: Not on file  Intimate Partner Violence: Not on file  :  Review of Systems  Constitutional: Negative.   HENT: Negative.    Eyes: Negative.   Respiratory: Negative.    Cardiovascular: Negative.   Gastrointestinal: Negative.   Genitourinary: Negative.   Musculoskeletal: Negative.   Skin: Negative.   Neurological: Negative.   Endo/Heme/Allergies: Negative.   Psychiatric/Behavioral: Negative.      Exam: '@IPVITALS'$ @ Physical Exam Vitals reviewed.  HENT:     Head: Normocephalic and atraumatic.  Eyes:     Pupils: Pupils are equal, round, and reactive to light.  Cardiovascular:     Rate and Rhythm: Normal rate and regular rhythm.     Heart sounds: Normal heart sounds.  Pulmonary:     Effort: Pulmonary effort is normal.     Breath sounds: Normal breath sounds.  Abdominal:     General: Bowel sounds are normal.     Palpations: Abdomen is soft.  Musculoskeletal:        General: No tenderness or deformity. Normal range of motion.     Cervical back: Normal range of motion.  Lymphadenopathy:     Cervical: No cervical adenopathy.  Skin:    General: Skin is warm and dry.     Findings: No erythema or rash.  Neurological:     Mental Status: She is alert and oriented to person, place, and time.  Psychiatric:        Behavior: Behavior normal.        Thought Content: Thought content normal.        Judgment:  Judgment normal.      Recent Labs    09/03/21 1410  WBC 4.2  HGB 14.2  HCT 46.6*  PLT 602*    Recent Labs    09/03/21 0936 09/03/21 1410  NA 138 143  K 4.1 4.3  CL 108 108  CO2 21* 27  GLUCOSE 99 101*  BUN 27*  33*  CREATININE 1.38* 1.39*  CALCIUM 9.8 10.0    Blood smear review: This is a normochromic and normocytic population of red blood cells.  She has no nucleated red blood cells.  There are no teardrop cells.  I see no rouleaux formation.  There is no schistocytes or spherocytes.  White blood cells are normal in morphology and maturation.  There is no hypersegmented polys.  I see no immature myeloid or lymphoid cells.  There is no blasts.  Platelets are markedly increased in number.  She has numerous large platelets.  Her platelets are well granulated.  Pathology: None    Assessment and Plan: Tina Jenkins is a very charming 71 year old African-American female.  She has, in my opinion, a myeloproliferative disorder.  Her blood smear is clearly indicative of that.  She has quite a few platelets.  She has numerous large platelets.  Her blood smear just looks "overactive."  I think the JAK2 will be positive.  Again it would not surprise me if this were the case.  She is already on a baby aspirin.  We may have to think about getting her on Hydrea depending on what we find with her JAK2 assay.  We will get an ultrasound of her abdomen to look at her spleen.  I do not think she needs to have a bone marrow test done.  I just do not think this will really help [place] out right now.  I had a long talk with she and her husband.  I explained her what I felt she had.  I explained her that a myeloproliferative process is some that is treatable but not curable.  Our goal is to make sure that she does not have complications from this.  Clearly, I would think that the recent cardiac event may have been from her elevated platelets and hemoglobin.  We may have to think abou phlebotomizing  her to try to get her hematocrit down to less than 45%.  Once I have the results back from her labs, particularly the JAK2 assay and her ultrasound, then we will plan to get her back for management recommendations.

## 2021-09-03 NOTE — Telephone Encounter (Signed)
Advanced Heart Failure Patient Advocate Encounter  Patient was seen in clinic today and wanted to go over the issue with the grant and [facility]. Was able to get RXs sent to [facility] outpatient for mailing. The patient is aware to call 5-6 days ahead for a refill. Phone number provided, [phone].  Charlann Boxer, CPhT

## 2021-09-04 LAB — VON WILLEBRAND PANEL
Coagulation Factor VIII: 112 % (ref 56–140)
Ristocetin Co-factor, Plasma: 54 % (ref 50–200)
Von Willebrand Antigen, Plasma: 115 % (ref 50–200)

## 2021-09-04 LAB — ERYTHROPOIETIN: Erythropoietin: 3.1 m[IU]/mL (ref 2.6–18.5)

## 2021-09-04 LAB — COAG STUDIES INTERP REPORT

## 2021-09-07 LAB — IRON AND IRON BINDING CAPACITY (CC-WL,HP ONLY)
Iron: 88 ug/dL (ref 28–170)
Saturation Ratios: 24 % (ref 10.4–31.8)
TIBC: 372 ug/dL (ref 250–450)
UIBC: 284 ug/dL (ref 148–442)

## 2021-09-10 ENCOUNTER — Telehealth (HOSPITAL_COMMUNITY): Payer: Self-pay | Admitting: Emergency Medicine

## 2021-09-10 NOTE — Telephone Encounter (Signed)
Attempted to call patient regarding upcoming cardiac MR appointment. Left message on voicemail with name and callback number Tayveon Lombardo RN Navigator Cardiac Imaging [facility] [phone] Office [phone] Cell  

## 2021-09-10 NOTE — Telephone Encounter (Signed)
Reaching out to patient to offer assistance regarding upcoming cardiac imaging study; pt verbalizes understanding of appt date/time, parking situation and where to check in, pre-test NPO status and medications ordered, and verified current allergies; name and call back number provided for further questions should they arise Marchia Bond RN Navigator Cardiac Imaging [facility] [phone] office [phone] cell  Denies iv issues Denies claustro Denies metal implants other than ortho plates/pin from femur fx. Arrival 1130

## 2021-09-13 ENCOUNTER — Ambulatory Visit (HOSPITAL_COMMUNITY)
Admission: RE | Admit: 2021-09-13 | Discharge: 2021-09-13 | Disposition: A | Discharge status: Home / Self Care | Payer: Medicare Other | Source: Ambulatory Visit | Attending: Cardiology | Admitting: Cardiology

## 2021-09-13 DIAGNOSIS — I5022 Chronic systolic (congestive) heart failure: Secondary | ICD-10-CM

## 2021-09-13 MED ORDER — GADOBUTROL 1 MMOL/ML IV SOLN
10.0000 mL | Freq: Once | INTRAVENOUS | Status: AC | PRN
  Administered 2021-09-13: 10 mL via INTRAVENOUS

## 2021-09-15 ENCOUNTER — Ambulatory Visit (HOSPITAL_BASED_OUTPATIENT_CLINIC_OR_DEPARTMENT_OTHER)
Admission: RE | Admit: 2021-09-15 | Discharge: 2021-09-15 | Disposition: A | Discharge status: Home / Self Care | Payer: Medicare Other | Source: Ambulatory Visit | Attending: Hematology & Oncology | Admitting: Hematology & Oncology

## 2021-09-15 DIAGNOSIS — D75839 Thrombocytosis, unspecified: Secondary | ICD-10-CM | POA: Diagnosis not present

## 2021-09-20 ENCOUNTER — Telehealth (HOSPITAL_COMMUNITY): Payer: Self-pay | Admitting: *Deleted

## 2021-09-20 NOTE — Telephone Encounter (Signed)
Pt left vm requesting CMRI results. I do not see where Dr.Bensimhon has documented on result note.

## 2021-09-21 ENCOUNTER — Other Ambulatory Visit (HOSPITAL_COMMUNITY): Payer: Self-pay | Admitting: *Deleted

## 2021-09-21 ENCOUNTER — Other Ambulatory Visit (HOSPITAL_COMMUNITY): Payer: Medicare Other

## 2021-09-21 ENCOUNTER — Telehealth: Payer: Self-pay | Admitting: Hematology & Oncology

## 2021-09-21 DIAGNOSIS — I5022 Chronic systolic (congestive) heart failure: Secondary | ICD-10-CM

## 2021-09-21 NOTE — Telephone Encounter (Signed)
Called to schedule lab only appointment per Dr Marin Olp, left voicemail for patient to call us back to schedule

## 2021-09-21 NOTE — Telephone Encounter (Signed)
Left vm for pt to return my call.  

## 2021-09-22 ENCOUNTER — Other Ambulatory Visit: Payer: Self-pay | Admitting: Lab

## 2021-09-22 ENCOUNTER — Inpatient Hospital Stay: Payer: Medicare Other | Attending: Hematology & Oncology

## 2021-09-22 DIAGNOSIS — Z79899 Other long term (current) drug therapy: Secondary | ICD-10-CM | POA: Insufficient documentation

## 2021-09-22 DIAGNOSIS — D75839 Thrombocytosis, unspecified: Secondary | ICD-10-CM

## 2021-09-22 DIAGNOSIS — D473 Essential (hemorrhagic) thrombocythemia: Secondary | ICD-10-CM | POA: Insufficient documentation

## 2021-09-28 ENCOUNTER — Other Ambulatory Visit (HOSPITAL_COMMUNITY): Payer: Medicare Other

## 2021-09-30 ENCOUNTER — Other Ambulatory Visit: Payer: Medicare Other

## 2021-09-30 ENCOUNTER — Ambulatory Visit: Payer: Medicare Other | Admitting: Hematology & Oncology

## 2021-09-30 LAB — JAK2 (INCLUDING V617F AND EXON 12), MPL,& CALR-NEXT GEN SEQ

## 2021-10-01 ENCOUNTER — Inpatient Hospital Stay (HOSPITAL_BASED_OUTPATIENT_CLINIC_OR_DEPARTMENT_OTHER): Payer: Medicare Other | Admitting: Hematology & Oncology

## 2021-10-01 ENCOUNTER — Inpatient Hospital Stay: Payer: Medicare Other

## 2021-10-01 ENCOUNTER — Ambulatory Visit (HOSPITAL_COMMUNITY)
Admission: RE | Admit: 2021-10-01 | Discharge: 2021-10-01 | Disposition: A | Discharge status: Home / Self Care | Payer: Medicare Other | Source: Ambulatory Visit | Attending: Family Medicine | Admitting: Family Medicine

## 2021-10-01 ENCOUNTER — Other Ambulatory Visit (HOSPITAL_COMMUNITY): Payer: Self-pay

## 2021-10-01 ENCOUNTER — Encounter (HOSPITAL_COMMUNITY): Payer: Self-pay

## 2021-10-01 ENCOUNTER — Encounter: Payer: Self-pay | Admitting: Hematology & Oncology

## 2021-10-01 ENCOUNTER — Other Ambulatory Visit: Payer: Self-pay

## 2021-10-01 VITALS — BP 98/61 | HR 83 | Temp 99.0°F | Resp 18 | Ht 59.06 in | Wt 146.0 lb

## 2021-10-01 VITALS — BP 110/66 | HR 55 | Wt 145.8 lb

## 2021-10-01 DIAGNOSIS — I428 Other cardiomyopathies: Secondary | ICD-10-CM | POA: Insufficient documentation

## 2021-10-01 DIAGNOSIS — I2510 Atherosclerotic heart disease of native coronary artery without angina pectoris: Secondary | ICD-10-CM | POA: Insufficient documentation

## 2021-10-01 DIAGNOSIS — N1831 Chronic kidney disease, stage 3a: Secondary | ICD-10-CM | POA: Insufficient documentation

## 2021-10-01 DIAGNOSIS — Z8249 Family history of ischemic heart disease and other diseases of the circulatory system: Secondary | ICD-10-CM | POA: Insufficient documentation

## 2021-10-01 DIAGNOSIS — Z7984 Long term (current) use of oral hypoglycemic drugs: Secondary | ICD-10-CM | POA: Insufficient documentation

## 2021-10-01 DIAGNOSIS — I5022 Chronic systolic (congestive) heart failure: Secondary | ICD-10-CM | POA: Diagnosis present

## 2021-10-01 DIAGNOSIS — D75839 Thrombocytosis, unspecified: Secondary | ICD-10-CM

## 2021-10-01 DIAGNOSIS — E785 Hyperlipidemia, unspecified: Secondary | ICD-10-CM | POA: Insufficient documentation

## 2021-10-01 DIAGNOSIS — I130 Hypertensive heart and chronic kidney disease with heart failure and stage 1 through stage 4 chronic kidney disease, or unspecified chronic kidney disease: Secondary | ICD-10-CM | POA: Insufficient documentation

## 2021-10-01 DIAGNOSIS — D473 Essential (hemorrhagic) thrombocythemia: Secondary | ICD-10-CM | POA: Insufficient documentation

## 2021-10-01 DIAGNOSIS — Z79899 Other long term (current) drug therapy: Secondary | ICD-10-CM | POA: Insufficient documentation

## 2021-10-01 DIAGNOSIS — I10 Essential (primary) hypertension: Secondary | ICD-10-CM | POA: Diagnosis not present

## 2021-10-01 HISTORY — DX: Essential (hemorrhagic) thrombocythemia: D47.3

## 2021-10-01 LAB — CMP (CANCER CENTER ONLY)
ALT: 17 U/L (ref 0–44)
AST: 21 U/L (ref 15–41)
Albumin: 4.4 g/dL (ref 3.5–5.0)
Alkaline Phosphatase: 72 U/L (ref 38–126)
Anion gap: 10 (ref 5–15)
BUN: 37 mg/dL — ABNORMAL HIGH (ref 8–23)
CO2: 29 mmol/L (ref 22–32)
Calcium: 10.5 mg/dL — ABNORMAL HIGH (ref 8.9–10.3)
Chloride: 104 mmol/L (ref 98–111)
Creatinine: 1.65 mg/dL — ABNORMAL HIGH (ref 0.44–1.00)
GFR, Estimated: 33 mL/min — ABNORMAL LOW (ref >60)
Glucose, Bld: 100 mg/dL — ABNORMAL HIGH (ref 70–99)
Potassium: 4.3 mmol/L (ref 3.5–5.1)
Sodium: 143 mmol/L (ref 135–145)
Total Bilirubin: 0.5 mg/dL (ref 0.3–1.2)
Total Protein: 7.3 g/dL (ref 6.5–8.1)

## 2021-10-01 LAB — CBC WITH DIFFERENTIAL (CANCER CENTER ONLY)
Abs Immature Granulocytes: 0.03 10*3/uL (ref 0.00–0.07)
Basophils Absolute: 0 10*3/uL (ref 0.0–0.1)
Basophils Relative: 1 %
Eosinophils Absolute: 0.2 10*3/uL (ref 0.0–0.5)
Eosinophils Relative: 5 %
HCT: 48.3 % — ABNORMAL HIGH (ref 36.0–46.0)
Hemoglobin: 15 g/dL (ref 12.0–15.0)
Immature Granulocytes: 1 %
Lymphocytes Relative: 24 %
Lymphs Abs: 1.1 10*3/uL (ref 0.7–4.0)
MCH: 27.1 pg (ref 26.0–34.0)
MCHC: 31.1 g/dL (ref 30.0–36.0)
MCV: 87.2 fL (ref 80.0–100.0)
Monocytes Absolute: 0.5 10*3/uL (ref 0.1–1.0)
Monocytes Relative: 11 %
Neutro Abs: 2.5 10*3/uL (ref 1.7–7.7)
Neutrophils Relative %: 58 %
Platelet Count: 710 10*3/uL — ABNORMAL HIGH (ref 150–400)
RBC: 5.54 MIL/uL — ABNORMAL HIGH (ref 3.87–5.11)
RDW: 15.9 % — ABNORMAL HIGH (ref 11.5–15.5)
WBC Count: 4.3 10*3/uL (ref 4.0–10.5)
nRBC: 0 % (ref 0.0–0.2)

## 2021-10-01 LAB — BASIC METABOLIC PANEL
Anion gap: 9 (ref 5–15)
BUN: 31 mg/dL — ABNORMAL HIGH (ref 8–23)
CO2: 25 mmol/L (ref 22–32)
Calcium: 10.3 mg/dL (ref 8.9–10.3)
Chloride: 107 mmol/L (ref 98–111)
Creatinine, Ser: 1.43 mg/dL — ABNORMAL HIGH (ref 0.44–1.00)
GFR, Estimated: 39 mL/min — ABNORMAL LOW (ref >60)
Glucose, Bld: 104 mg/dL — ABNORMAL HIGH (ref 70–99)
Potassium: 4.3 mmol/L (ref 3.5–5.1)
Sodium: 141 mmol/L (ref 135–145)

## 2021-10-01 LAB — IRON AND IRON BINDING CAPACITY (CC-WL,HP ONLY)
Iron: 102 ug/dL (ref 28–170)
Saturation Ratios: 25 % (ref 10.4–31.8)
TIBC: 402 ug/dL (ref 250–450)
UIBC: 300 ug/dL (ref 148–442)

## 2021-10-01 LAB — SAVE SMEAR(SSMR), FOR PROVIDER SLIDE REVIEW

## 2021-10-01 LAB — RETICULOCYTES
Immature Retic Fract: 14 % (ref 2.3–15.9)
RBC.: 5.51 MIL/uL — ABNORMAL HIGH (ref 3.87–5.11)
Retic Count, Absolute: 71.1 10*3/uL (ref 19.0–186.0)
Retic Ct Pct: 1.3 % (ref 0.4–3.1)

## 2021-10-01 LAB — LACTATE DEHYDROGENASE: LDH: 204 U/L — ABNORMAL HIGH (ref 98–192)

## 2021-10-01 LAB — BRAIN NATRIURETIC PEPTIDE: B Natriuretic Peptide: 96.7 pg/mL (ref 0.0–100.0)

## 2021-10-01 LAB — FERRITIN: Ferritin: 27 ng/mL (ref 11–307)

## 2021-10-01 MED ORDER — HYDROXYUREA 500 MG PO CAPS: 500.0000 mg | ORAL_CAPSULE | Freq: Every day | ORAL | 6 refills | Status: DC

## 2021-10-01 NOTE — Progress Notes (Signed)
Hematology and Oncology Follow Up Visit  Tina Jenkins [id number] 04-06-1951 71 y.o. 10/01/2021   Principle Diagnosis:  Essential thrombocythemia-  JAK2+/CALR+  Current Therapy:   Hydrea 500 mg p.o. daily-start on 10/02/2021     Interim History:  Tina Jenkins is back for second office visit.  She was last seen back in May.  At that time, I thought that she probably had probably thalassemia.  We prove this with our genetic studies.  Surprising, she has both the JAK2 mutation and the Calreticulin mutation.  We did do an ultrasound of her abdomen.  There is no splenomegaly.  She feels okay.  She is little bit anxious.  I do not see that we have to do a bone marrow test on her.  I do still think a bone marrow will really add to what we know already.  I do think would be a good idea to get her on Hydrea.  I think 500 mg a day of Hydrea would be a good start for her.  She has had no pain in her hands or feet.  There is been no palpitations.  She does see cardiology.  I told her that I do not think that the thrombocythemia would affect her heart and vice versa.  She has had no fever.  There has been no bleeding.  She has had no leg swelling.  Overall, her performance status is ECOG 1.      Medications:  Current Outpatient Medications:    aspirin EC 81 MG tablet, Take 81 mg by mouth daily., Disp: , Rfl:    atorvastatin (LIPITOR) 20 MG tablet, Take 20 mg by mouth at bedtime. , Disp: , Rfl:    Calcium Carbonate-Vit D-Min (CALCIUM 1200 PO), Take 1,200 mg by mouth 2 (two) times daily., Disp: , Rfl:    carvedilol (COREG) 6.25 MG tablet, Take 1 tablet (6.25 mg total) by mouth 2 (two) times daily., Disp: 180 tablet, Rfl: 3   dapagliflozin propanediol (FARXIGA) 10 MG TABS tablet, Take 1 tablet (10 mg total) by mouth daily before breakfast., Disp: 30 tablet, Rfl: 11   furosemide (LASIX) 40 MG tablet, Take 40 mg by mouth as needed. With a weight gain of 3 lbs or more., Disp: , Rfl:     glucosamine-chondroitin 500-400 MG tablet, Take 1 tablet by mouth 2 (two) times daily., Disp: , Rfl:    Multiple Vitamin (MULTIVITAMIN WITH MINERALS) TABS, Take 1 tablet by mouth daily., Disp: , Rfl:    sacubitril-valsartan (ENTRESTO) 24-26 MG, Take 1 tablet by mouth 2 (two) times daily., Disp: 60 tablet, Rfl: 11   spironolactone (ALDACTONE) 25 MG tablet, Take 1 tablet (25 mg total) by mouth daily., Disp: 30 tablet, Rfl: 11   Turmeric 500 MG CAPS, Take 1 capsule by mouth 2 (two) times daily., Disp: , Rfl:   Allergies: No Known Allergies  Past Medical History, Surgical history, Social history, and Family History were reviewed and updated.  Review of Systems: Review of Systems  Constitutional: Negative.   HENT:  Negative.    Eyes: Negative.   Respiratory: Negative.    Cardiovascular: Negative.   Gastrointestinal: Negative.   Endocrine: Negative.   Genitourinary: Negative.    Musculoskeletal: Negative.   Skin: Negative.   Neurological: Negative.   Hematological: Negative.   Psychiatric/Behavioral: Negative.      Physical Exam:  height is 4' 11.06" (1.5 m) and weight is 146 lb (66.2 kg). Her oral temperature is 99 F (37.2 C). Her blood  pressure is 98/61 and her pulse is 83. Her respiration is 18 and oxygen saturation is 99%.   Wt Readings from Last 3 Encounters:  10/01/21 146 lb (66.2 kg)  10/01/21 145 lb 12.8 oz (66.1 kg)  09/03/21 145 lb (65.8 kg)    Physical Exam Vitals reviewed.  HENT:     Head: Normocephalic and atraumatic.  Eyes:     Pupils: Pupils are equal, round, and reactive to light.  Cardiovascular:     Rate and Rhythm: Normal rate and regular rhythm.     Heart sounds: Normal heart sounds.  Pulmonary:     Effort: Pulmonary effort is normal.     Breath sounds: Normal breath sounds.  Abdominal:     General: Bowel sounds are normal.     Palpations: Abdomen is soft.  Musculoskeletal:        General: No tenderness or deformity. Normal range of motion.      Cervical back: Normal range of motion.  Lymphadenopathy:     Cervical: No cervical adenopathy.  Skin:    General: Skin is warm and dry.     Findings: No erythema or rash.  Neurological:     Mental Status: She is alert and oriented to person, place, and time.  Psychiatric:        Behavior: Behavior normal.        Thought Content: Thought content normal.        Judgment: Judgment normal.      Lab Results  Component Value Date   WBC 4.3 10/01/2021   HGB 15.0 10/01/2021   HCT 48.3 (H) 10/01/2021   MCV 87.2 10/01/2021   PLT 710 (H) 10/01/2021     Chemistry      Component Value Date/Time   NA 143 10/01/2021 1253   K 4.3 10/01/2021 1253   CL 104 10/01/2021 1253   CO2 29 10/01/2021 1253   BUN 37 (H) 10/01/2021 1253   CREATININE 1.65 (H) 10/01/2021 1253   CREATININE 1.43 (H) 10/01/2021 0924      Component Value Date/Time   CALCIUM 10.5 (H) 10/01/2021 1253   ALKPHOS 72 10/01/2021 1253   AST 21 10/01/2021 1253   ALT 17 10/01/2021 1253   BILITOT 0.5 10/01/2021 1253      Impression and Plan: Tina Jenkins is a very charming 71 year old African-American female.  She has essential thrombocythemia.  This is proven by our molecular studies.  We will see how she does with the Hydrea.  I do not think that we have to do a bone marrow test on her.  I do not see that we have to do any additional studies on her.  She has a double mutation.  This may indicate that she may have a little bit more resilient to the Hydrea.  However, I think that Hydrea would work and get her platelet count down.  I would like to see her back in about a month.  At that point, we should be able to see her platelet count coming down nicely.   Tina Macho, MD 6/23/20232:18 PM

## 2021-10-25 ENCOUNTER — Other Ambulatory Visit (HOSPITAL_COMMUNITY): Payer: Self-pay

## 2021-10-26 ENCOUNTER — Other Ambulatory Visit (HOSPITAL_COMMUNITY): Payer: Self-pay

## 2021-10-29 ENCOUNTER — Other Ambulatory Visit: Payer: Self-pay

## 2021-10-29 ENCOUNTER — Ambulatory Visit (HOSPITAL_BASED_OUTPATIENT_CLINIC_OR_DEPARTMENT_OTHER)
Admission: RE | Admit: 2021-10-29 | Discharge: 2021-10-29 | Disposition: A | Discharge status: Home / Self Care | Payer: Medicare Other | Source: Ambulatory Visit | Attending: Internal Medicine | Admitting: Internal Medicine

## 2021-10-29 ENCOUNTER — Encounter: Payer: Self-pay | Admitting: Hematology & Oncology

## 2021-10-29 ENCOUNTER — Inpatient Hospital Stay: Payer: Medicare Other | Attending: Hematology & Oncology

## 2021-10-29 ENCOUNTER — Inpatient Hospital Stay (HOSPITAL_BASED_OUTPATIENT_CLINIC_OR_DEPARTMENT_OTHER): Payer: Medicare Other | Admitting: Hematology & Oncology

## 2021-10-29 ENCOUNTER — Encounter (HOSPITAL_COMMUNITY): Payer: Self-pay | Admitting: Internal Medicine

## 2021-10-29 VITALS — BP 116/76 | HR 78 | Wt 144.4 lb

## 2021-10-29 VITALS — BP 120/64 | HR 63 | Temp 97.9°F | Resp 18 | Wt 145.0 lb

## 2021-10-29 DIAGNOSIS — D75839 Thrombocytosis, unspecified: Secondary | ICD-10-CM

## 2021-10-29 DIAGNOSIS — I5022 Chronic systolic (congestive) heart failure: Secondary | ICD-10-CM | POA: Insufficient documentation

## 2021-10-29 DIAGNOSIS — E785 Hyperlipidemia, unspecified: Secondary | ICD-10-CM | POA: Insufficient documentation

## 2021-10-29 DIAGNOSIS — D473 Essential (hemorrhagic) thrombocythemia: Secondary | ICD-10-CM | POA: Diagnosis present

## 2021-10-29 DIAGNOSIS — N1831 Chronic kidney disease, stage 3a: Secondary | ICD-10-CM | POA: Diagnosis not present

## 2021-10-29 DIAGNOSIS — I10 Essential (primary) hypertension: Secondary | ICD-10-CM

## 2021-10-29 DIAGNOSIS — I110 Hypertensive heart disease with heart failure: Secondary | ICD-10-CM | POA: Insufficient documentation

## 2021-10-29 DIAGNOSIS — I130 Hypertensive heart and chronic kidney disease with heart failure and stage 1 through stage 4 chronic kidney disease, or unspecified chronic kidney disease: Secondary | ICD-10-CM | POA: Diagnosis not present

## 2021-10-29 LAB — CBC WITH DIFFERENTIAL (CANCER CENTER ONLY)
Abs Immature Granulocytes: 0.01 10*3/uL (ref 0.00–0.07)
Basophils Absolute: 0 10*3/uL (ref 0.0–0.1)
Basophils Relative: 1 %
Eosinophils Absolute: 0.2 10*3/uL (ref 0.0–0.5)
Eosinophils Relative: 4 %
HCT: 45.4 % (ref 36.0–46.0)
Hemoglobin: 14.4 g/dL (ref 12.0–15.0)
Immature Granulocytes: 0 %
Lymphocytes Relative: 30 %
Lymphs Abs: 1 10*3/uL (ref 0.7–4.0)
MCH: 28.1 pg (ref 26.0–34.0)
MCHC: 31.7 g/dL (ref 30.0–36.0)
MCV: 88.7 fL (ref 80.0–100.0)
Monocytes Absolute: 0.5 10*3/uL (ref 0.1–1.0)
Monocytes Relative: 15 %
Neutro Abs: 1.7 10*3/uL (ref 1.7–7.7)
Neutrophils Relative %: 50 %
Platelet Count: 480 10*3/uL — ABNORMAL HIGH (ref 150–400)
RBC: 5.12 MIL/uL — ABNORMAL HIGH (ref 3.87–5.11)
RDW: 17.5 % — ABNORMAL HIGH (ref 11.5–15.5)
WBC Count: 3.5 10*3/uL — ABNORMAL LOW (ref 4.0–10.5)
nRBC: 0 % (ref 0.0–0.2)

## 2021-10-29 LAB — ECHOCARDIOGRAM COMPLETE
Area-P 1/2: 2.51 cm2
S' Lateral: 3.6 cm
Weight: 2320 oz

## 2021-10-29 LAB — CMP (CANCER CENTER ONLY)
ALT: 23 U/L (ref 0–44)
AST: 26 U/L (ref 15–41)
Albumin: 4.7 g/dL (ref 3.5–5.0)
Alkaline Phosphatase: 72 U/L (ref 38–126)
Anion gap: 8 (ref 5–15)
BUN: 41 mg/dL — ABNORMAL HIGH (ref 8–23)
CO2: 29 mmol/L (ref 22–32)
Calcium: 10.6 mg/dL — ABNORMAL HIGH (ref 8.9–10.3)
Chloride: 104 mmol/L (ref 98–111)
Creatinine: 1.57 mg/dL — ABNORMAL HIGH (ref 0.44–1.00)
GFR, Estimated: 35 mL/min — ABNORMAL LOW (ref >60)
Glucose, Bld: 99 mg/dL (ref 70–99)
Potassium: 4.5 mmol/L (ref 3.5–5.1)
Sodium: 141 mmol/L (ref 135–145)
Total Bilirubin: 0.5 mg/dL (ref 0.3–1.2)
Total Protein: 7.3 g/dL (ref 6.5–8.1)

## 2021-10-29 LAB — SAVE SMEAR(SSMR), FOR PROVIDER SLIDE REVIEW

## 2021-10-29 LAB — LACTATE DEHYDROGENASE: LDH: 198 U/L — ABNORMAL HIGH (ref 98–192)

## 2021-10-29 NOTE — Progress Notes (Signed)
Hematology and Oncology Follow Up Visit  Tina Jenkins [id number] [birth year] 71 y.o. 10/29/2021   Principle Diagnosis:  Essential thrombocythemia-  JAK2+/CALR+  Current Therapy:   Hydrea 500 mg p.o. daily-start on 10/02/2021     Interim History:  Tina Jenkins is back for follow-up.  She is doing quite nicely.  She is doing really well on the Hydrea.  She has had no toxicity from the [facility] so far.  She has had no diarrhea.  There is been no rashes.  She has had no cough or shortness of breath.  She sees cardiology today.  She says an echocardiogram will be done.  She has had no leg swelling.  Overall, I would say performance status is probably ECOG 1.    Medications:  Current Outpatient Medications:    aspirin EC 81 MG tablet, Take 81 mg by mouth daily., Disp: , Rfl:    atorvastatin (LIPITOR) 20 MG tablet, Take 20 mg by mouth at bedtime. , Disp: , Rfl:    Calcium Carbonate-Vit D-Min (CALCIUM 1200 PO), Take 1,200 mg by mouth 2 (two) times daily., Disp: , Rfl:    carvedilol (COREG) 6.25 MG tablet, Take 1 tablet (6.25 mg total) by mouth 2 (two) times daily., Disp: 180 tablet, Rfl: 3   dapagliflozin propanediol (FARXIGA) 10 MG TABS tablet, Take 1 tablet (10 mg total) by mouth daily before breakfast., Disp: 30 tablet, Rfl: 11   furosemide (LASIX) 40 MG tablet, Take 40 mg by mouth as needed. With a weight gain of 3 lbs or more., Disp: , Rfl:    glucosamine-chondroitin 500-400 MG tablet, Take 1 tablet by mouth 2 (two) times daily., Disp: , Rfl:    hydroxyurea (HYDREA) 500 MG capsule, Take 1 capsule (500 mg total) by mouth daily. May take with food to minimize GI side effects., Disp: 30 capsule, Rfl: 6   Multiple Vitamin (MULTIVITAMIN WITH MINERALS) TABS, Take 1 tablet by mouth daily., Disp: , Rfl:    sacubitril-valsartan (ENTRESTO) 24-26 MG, Take 1 tablet by mouth 2 (two) times daily., Disp: 60 tablet, Rfl: 11   spironolactone (ALDACTONE) 25 MG tablet, Take 1 tablet (25 mg total) by mouth  daily., Disp: 30 tablet, Rfl: 11   Turmeric 500 MG CAPS, Take 1 capsule by mouth 2 (two) times daily., Disp: , Rfl:   Allergies: No Known Allergies  Past Medical History, Surgical history, Social history, and Family History were reviewed and updated.  Review of Systems: Review of Systems  Constitutional: Negative.   HENT:  Negative.    Eyes: Negative.   Respiratory: Negative.    Cardiovascular: Negative.   Gastrointestinal: Negative.   Endocrine: Negative.   Genitourinary: Negative.    Musculoskeletal: Negative.   Skin: Negative.   Neurological: Negative.   Hematological: Negative.   Psychiatric/Behavioral: Negative.      Physical Exam:  weight is 145 lb (65.8 kg). Her oral temperature is 97.9 F (36.6 C). Her blood pressure is 120/64 and her pulse is 63. Her respiration is 18 and oxygen saturation is 100%.   Wt Readings from Last 3 Encounters:  10/29/21 145 lb (65.8 kg)  10/01/21 145 lb 12.8 oz (66.1 kg)  10/01/21 146 lb (66.2 kg)    Physical Exam Vitals reviewed.  HENT:     Head: Normocephalic and atraumatic.  Eyes:     Pupils: Pupils are equal, round, and reactive to light.  Cardiovascular:     Rate and Rhythm: Normal rate and regular rhythm.     Heart  sounds: Normal heart sounds.  Pulmonary:     Effort: Pulmonary effort is normal.     Breath sounds: Normal breath sounds.  Abdominal:     General: Bowel sounds are normal.     Palpations: Abdomen is soft.  Musculoskeletal:        General: No tenderness or deformity. Normal range of motion.     Cervical back: Normal range of motion.  Lymphadenopathy:     Cervical: No cervical adenopathy.  Skin:    General: Skin is warm and dry.     Findings: No erythema or rash.  Neurological:     Mental Status: She is alert and oriented to person, place, and time.  Psychiatric:        Behavior: Behavior normal.        Thought Content: Thought content normal.        Judgment: Judgment normal.      Lab Results   Component Value Date   WBC 3.5 (L) 10/29/2021   HGB 14.4 10/29/2021   HCT 45.4 10/29/2021   MCV 88.7 10/29/2021   PLT 480 (H) 10/29/2021     Chemistry      Component Value Date/Time   NA 143 10/01/2021 1253   K 4.3 10/01/2021 1253   CL 104 10/01/2021 1253   CO2 29 10/01/2021 1253   BUN 37 (H) 10/01/2021 1253   CREATININE 1.65 (H) 10/01/2021 1253   CREATININE 1.43 (H) 10/01/2021 0924      Component Value Date/Time   CALCIUM 10.5 (H) 10/01/2021 1253   ALKPHOS 72 10/01/2021 1253   AST 21 10/01/2021 1253   ALT 17 10/01/2021 1253   BILITOT 0.5 10/01/2021 1253      Impression and Plan: Tina Jenkins is a very charming 71 year old African-American female.  She has essential thrombocythemia.  This is proven by our molecular studies.  The Hydrea is doing quite nicely.  Her platelet count is coming down quite well.  Her white cell count is down a little bit.  We will have to watch for this.  She is certain does not have erythrocytosis at this point.  I looked at her blood smear.  Everything looked pretty normal under the blood smear.  Her platelets were somewhat large.  Platelets were well granulated.  There were no nucleated red blood cells.  I think we will plan to get her back in another 6 weeks.  I want to see her back before the Labor Day weekend.  Volanda Napoleon, MD 7/21/20238:32 AM

## 2021-10-29 NOTE — Patient Instructions (Signed)
No Labs done today.   No medication changes were made. Please continue all current medications as prescribed.  Your physician recommends that you schedule a follow-up appointment in: 6 months with Dr. Haroldine Laws. Please contact our office in December 2023 to schedule a January 2024 appointment.   If you have any questions or concerns before your next appointment please send [place] a message through [place] or call our office at [phone].    TO LEAVE A MESSAGE FOR THE NURSE SELECT OPTION 2, PLEASE LEAVE A MESSAGE INCLUDING: YOUR NAME DATE OF BIRTH CALL BACK NUMBER REASON FOR CALL**this is important as we prioritize the call backs  YOU WILL RECEIVE A CALL BACK THE SAME DAY AS LONG AS YOU CALL BEFORE 4:00 PM   Do the following things EVERYDAY: Weigh yourself in the morning before breakfast. Write it down and keep it in a log. Take your medicines as prescribed Eat low salt foods--Limit salt (sodium) to 2000 mg per day.  Stay as active as you can everyday Limit all fluids for the day to less than 2 liters   At the [facility], you and your health needs are our priority. As part of our continuing mission to provide you with exceptional heart care, we have created designated Provider Care Teams. These Care Teams include your primary Cardiologist (physician) and Advanced Practice Providers (APPs- Physician Assistants and Nurse Practitioners) who all work together to provide you with the care you need, when you need it.   You may see any of the following providers on your designated Care Team at your next follow up: Dr Glori Bickers Dr Haynes Kerns, NP Lyda Jester, [place] Audry Riles, PharmD   Please be sure to bring in all your medications bottles to every appointment.

## 2021-10-29 NOTE — Progress Notes (Signed)
ADVANCED HF CLINIC NOTE  PCP: Kelton Pillar, MD Primary Cardiologist: Dr. Johney Frame  HF Cardiologist: Dr. Haroldine Laws Hematology:Dr Marin Olp    HPI: Tina Jenkins is a 71 y.o. AAF w/ h/o HTN controlled w/ medications, HLD, CKD IIIa, and chronic systolic heart failure/ NICM.    Has family h/o CHF and SCD. Her brother died at the age of 44 while running/playing. Her mother said he had a "bad heart". Her father also had CHF but older in life and lived until his 42s.    She had COVID in October 2022 but reports only mild symptoms.   Admitted 4/23 with new acute CHF. Had URI 2 weeks prior, COVID negative at that time. Diuresed w/ IV Lasix. Echo showed reduced LVEF, 35-40%, RV normal. Hs trop elevated not c/w acute myocarditis, 15>>19. Underwent R/LHC showing mild nonobstructive CAD, normal filling pressures and normal cardiac output.  GDMT initiated, referred to [facility], and discharged home with weight 146 lbs.    Seen in [facility] 4/23, feeling well. Stable NYHA I-II symptoms, euvolemic. Entresto started and Lasix changed to PRN.  cMRI (6/23) showed LVEF 36%, RVEF 44%, no LGE.  Today she returns for HF follow up.Overall feeling fine. Denies SOB/PND/Orthopnea. Very active in the garden and able to walk back and forth to water. Appetite ok. No fever or chills. Weight at home stable.  She has been taking lasix M-W-F . Taking all medications.  Has 1 living son. Her other son died 2 years ago at age 50 of metastatic prostate cancer.    Cardiac Testing   - cMRI (6/23): LVEF 36% moderate HK, RVEF 44%, no LGE  - Echo 10/2021: EF 35-40% RV normal.   - Echo (4/23): EF 35-40%, LV moderately decreased with global HK, moderate LVH, RV ok, mild to moderate MR  - R/LHC (4/23):  CO/CI (Fick) 4.51/2.78 L/min PA: 30/9 (mean 17) mmHg RA A: 3 mmHg RA V:1 mmHg RA mean (0) RVSP: 26 mmgHg LVEDP: 3 mmHg  25% distal left main. 25% ostial LAD. 25% ostial circumflex. Normal dominant right coronary. Normal right  heart pressures without pulmonary hypertension.  LVEDP 3 mmHg and mean wedge pressure 4 mmHg. Global left ventricular hypokinesis consistent with systolic heart failure, compensated. Etiology uncertain but illness was preceded by an upper respiratory infection and therefore could be postviral myocarditis. Father died of heart failure and therefore genetic predisposition is a consideration as well. Quadruple therapy with hopeful full recovery of LV function.   ROS: All systems reviewed and negative except as per HPI.   Past Medical History:  Diagnosis Date   Chronic kidney disease    kidney stones   Essential thrombocythemia ([place]) 10/01/2021   Hypercholesteremia    Hypertension    Nonischemic cardiomyopathy (HCC)    Systolic heart failure (HCC)    Current Outpatient Medications  Medication Sig Dispense Refill   aspirin EC 81 MG tablet Take 81 mg by mouth daily.     atorvastatin (LIPITOR) 20 MG tablet Take 20 mg by mouth at bedtime.      Calcium Carbonate-Vit D-Min (CALCIUM 1200 PO) Take 1,200 mg by mouth 2 (two) times daily.     carvedilol (COREG) 6.25 MG tablet Take 1 tablet (6.25 mg total) by mouth 2 (two) times daily. 180 tablet 3   dapagliflozin propanediol (FARXIGA) 10 MG TABS tablet Take 1 tablet (10 mg total) by mouth daily before breakfast. 30 tablet 11   furosemide (LASIX) 40 MG tablet Take 40 mg by mouth as needed. With  a weight gain of 3 lbs or more.     glucosamine-chondroitin 500-400 MG tablet Take 1 tablet by mouth 2 (two) times daily.     hydroxyurea (HYDREA) 500 MG capsule Take 1 capsule (500 mg total) by mouth daily. May take with food to minimize GI side effects. 30 capsule 6   Multiple Vitamin (MULTIVITAMIN WITH MINERALS) TABS Take 1 tablet by mouth daily.     sacubitril-valsartan (ENTRESTO) 24-26 MG Take 1 tablet by mouth 2 (two) times daily. 60 tablet 11   spironolactone (ALDACTONE) 25 MG tablet Take 1 tablet (25 mg total) by mouth daily. 30 tablet 11   Turmeric 500  MG CAPS Take 1 capsule by mouth 2 (two) times daily.     No current facility-administered medications for this encounter.   No Known Allergies  Social History   Socioeconomic History   Marital status: Married    Spouse name: Chief Strategy Officer   Number of children: 1   Years of education: Not on file   Highest education level: High school graduate  Occupational History   Occupation: Retired    Comment: [employer]  Tobacco Use   Smoking status: Never   Smokeless tobacco: Not on file  Vaping Use   Vaping Use: Never used  Substance and Sexual Activity   Alcohol use: No   Drug use: No   Sexual activity: Not on file  Other Topics Concern   Not on file  Social History Narrative   Not on file   Social Determinants of Health   Financial Resource Strain: Low Risk  (07/26/2021)   Overall Financial Resource Strain (CARDIA)    Difficulty of Paying Living Expenses: Not hard at all  Food Insecurity: No Food Insecurity (07/26/2021)   Hunger Vital Sign    Worried About Running Out of Food in the Last Year: Never true    Ran Out of Food in the Last Year: Never true  Transportation Needs: No Transportation Needs (07/26/2021)   PRAPARE - Hydrologist (Medical): No    Lack of Transportation (Non-Medical): No  Physical Activity: Not on file  Stress: Not on file  Social Connections: Not on file  Intimate Partner Violence: Not on file   Family History  Problem Relation Age of Onset   Heart failure Father    Sudden Cardiac Death Brother 12   BP 116/76   Pulse 78   Wt 65.5 kg (144 lb 6.4 oz)   SpO2 98%   BMI 29.11 kg/m   Wt Readings from Last 3 Encounters:  10/29/21 65.5 kg (144 lb 6.4 oz)  10/29/21 65.8 kg (145 lb)  10/01/21 66.1 kg (145 lb 12.8 oz)   PHYSICAL EXAM: General:  Well appearing. No resp difficulty HEENT: normal Neck: supple. no JVD. Carotids 2+ bilat; no bruits. No lymphadenopathy or thryomegaly appreciated. Cor: PMI nondisplaced. Regular rate &  rhythm. No rubs, gallops or murmurs. Lungs: clear Abdomen: soft, nontender, nondistended. No hepatosplenomegaly. No bruits or masses. Good bowel sounds. Extremities: no cyanosis, clubbing, rash, edema Neuro: alert & orientedx3, cranial nerves grossly intact. moves all 4 extremities w/o difficulty. Affect pleasant   ASSESSMENT & PLAN: Chronic Systolic Heart Failure - NICM. - Echo (4/23): EF 35-40%, RV normal - L/RHC (4/23): w/ mild obstructive CAD, normal filling pressures and normal CO  - Etiology uncertain, but acute CHF exacerbation was preceded by an upper respiratory infection and therefore could be postviral myocarditis. Doubt acute given Hs trop trend but did have COVID  10/22.  - Also ? Familial CM given family history. Father died from HF.  - cMRI 2022/10/08) LVEF 36%, RVEF 44%, no LGE. - Echo today EF 35-40% RV normal . Out of range for cardiomyopathy.  - NYHA I. Volume status stable. Continue lasix M-W-F  - Continue Coreg 6.25 mg bid. - Continue Entresto 24-26 mg bid.  - Continue Farxiga 10 mg daily.  - Continue spironolactone 25 mg daily.   2. Hypertension  Stable   3. Stage IIIa CKD  - Continue Farxiga 10 mg daily.  - Creatinine today 1.57.   4. 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 She saw Heme/Onc today. Platelets 480 today. - ? Contributing to CM?  Kolbie Lepkowski NP-C   10/29/21

## 2021-11-01 ENCOUNTER — Other Ambulatory Visit (HOSPITAL_COMMUNITY): Payer: Self-pay | Admitting: Cardiology

## 2021-11-04 ENCOUNTER — Other Ambulatory Visit (HOSPITAL_COMMUNITY): Payer: Self-pay | Admitting: *Deleted

## 2021-11-30 ENCOUNTER — Other Ambulatory Visit (HOSPITAL_COMMUNITY): Payer: Self-pay

## 2021-12-10 ENCOUNTER — Inpatient Hospital Stay: Payer: Medicare Other | Attending: Hematology & Oncology

## 2021-12-10 ENCOUNTER — Encounter: Payer: Self-pay | Admitting: Hematology & Oncology

## 2021-12-10 ENCOUNTER — Inpatient Hospital Stay (HOSPITAL_BASED_OUTPATIENT_CLINIC_OR_DEPARTMENT_OTHER): Payer: Medicare Other | Admitting: Hematology & Oncology

## 2021-12-10 VITALS — BP 94/63 | HR 80 | Temp 98.1°F | Resp 20 | Ht 59.0 in | Wt 142.8 lb

## 2021-12-10 DIAGNOSIS — D473 Essential (hemorrhagic) thrombocythemia: Secondary | ICD-10-CM

## 2021-12-10 LAB — CMP (CANCER CENTER ONLY)
ALT: 18 U/L (ref 0–44)
AST: 24 U/L (ref 15–41)
Albumin: 4.4 g/dL (ref 3.5–5.0)
Alkaline Phosphatase: 53 U/L (ref 38–126)
Anion gap: 7 (ref 5–15)
BUN: 39 mg/dL — ABNORMAL HIGH (ref 8–23)
CO2: 29 mmol/L (ref 22–32)
Calcium: 10.4 mg/dL — ABNORMAL HIGH (ref 8.9–10.3)
Chloride: 104 mmol/L (ref 98–111)
Creatinine: 1.69 mg/dL — ABNORMAL HIGH (ref 0.44–1.00)
GFR, Estimated: 32 mL/min — ABNORMAL LOW (ref >60)
Glucose, Bld: 104 mg/dL — ABNORMAL HIGH (ref 70–99)
Potassium: 4.5 mmol/L (ref 3.5–5.1)
Sodium: 140 mmol/L (ref 135–145)
Total Bilirubin: 0.7 mg/dL (ref 0.3–1.2)
Total Protein: 7.2 g/dL (ref 6.5–8.1)

## 2021-12-10 LAB — CBC WITH DIFFERENTIAL (CANCER CENTER ONLY)
Abs Immature Granulocytes: 0.02 10*3/uL (ref 0.00–0.07)
Basophils Absolute: 0 10*3/uL (ref 0.0–0.1)
Basophils Relative: 1 %
Eosinophils Absolute: 0.2 10*3/uL (ref 0.0–0.5)
Eosinophils Relative: 5 %
HCT: 44.4 % (ref 36.0–46.0)
Hemoglobin: 14 g/dL (ref 12.0–15.0)
Immature Granulocytes: 1 %
Lymphocytes Relative: 31 %
Lymphs Abs: 0.9 10*3/uL (ref 0.7–4.0)
MCH: 29.4 pg (ref 26.0–34.0)
MCHC: 31.5 g/dL (ref 30.0–36.0)
MCV: 93.3 fL (ref 80.0–100.0)
Monocytes Absolute: 0.4 10*3/uL (ref 0.1–1.0)
Monocytes Relative: 14 %
Neutro Abs: 1.4 10*3/uL — ABNORMAL LOW (ref 1.7–7.7)
Neutrophils Relative %: 48 %
Platelet Count: 438 10*3/uL — ABNORMAL HIGH (ref 150–400)
RBC: 4.76 MIL/uL (ref 3.87–5.11)
RDW: 18.5 % — ABNORMAL HIGH (ref 11.5–15.5)
WBC Count: 3 10*3/uL — ABNORMAL LOW (ref 4.0–10.5)
nRBC: 0 % (ref 0.0–0.2)

## 2021-12-10 LAB — LACTATE DEHYDROGENASE: LDH: 178 U/L (ref 98–192)

## 2021-12-10 LAB — SAVE SMEAR(SSMR), FOR PROVIDER SLIDE REVIEW

## 2021-12-10 NOTE — Progress Notes (Signed)
Hematology and Oncology Follow Up Visit  Tina Jenkins [id number] [birth year] 71 y.o. 12/10/2021   Principle Diagnosis:  Essential thrombocythemia-  JAK2+/CALR+  Current Therapy:   Hydrea 500 mg p.o. daily-start on 10/02/2021     Interim History:  Tina Jenkins is back for follow-up.  She is doing quite well.  She really has no complaints since we last saw her.  When we last saw her, her ferritin was 27 with an iron saturation of 25%.  Her heart seems to be doing pretty well.  She is on carvedilol.  She is also on [place].  She has had no problem with the Hydrea.  She has had no nausea or vomiting.  She has had no diarrhea..  She says that when she rides her exercise bike, she gets occasional numbness in the right hand only.  She has had no rashes.  There is been no headache.  She has had no visual changes.  She has had no cough or shortness of breath.  Her son recently had COVID.  She has been avoiding him.  She and her [place].  They have a garden.  She has been attending the garden.  They have been able to give a lot of produce the way to church members.  Overall, I would say performance status is probably ECOG 0.     Medications:  Current Outpatient Medications:    aspirin EC 81 MG tablet, Take 81 mg by mouth daily., Disp: , Rfl:    atorvastatin (LIPITOR) 20 MG tablet, Take 20 mg by mouth at bedtime. , Disp: , Rfl:    Calcium Carbonate-Vit D-Min (CALCIUM 1200 PO), Take 1,200 mg by mouth 2 (two) times daily., Disp: , Rfl:    carvedilol (COREG) 6.25 MG tablet, Take 1 tablet (6.25 mg total) by mouth 2 (two) times daily., Disp: 180 tablet, Rfl: 3   dapagliflozin propanediol (FARXIGA) 10 MG TABS tablet, Take 1 tablet (10 mg total) by mouth daily before breakfast., Disp: 30 tablet, Rfl: 11   diphenhydramine-acetaminophen (TYLENOL PM) 25-500 MG TABS tablet, Take 1 tablet by mouth at bedtime as needed., Disp: , Rfl:    furosemide (LASIX) 40 MG  tablet, TAKE 1 TABLET BY MOUTH 3 TIMES A WEEK ON MONDAY, WEDNESDAY, FRIDAY, Disp: 30 tablet, Rfl: 3   glucosamine-chondroitin 500-400 MG tablet, Take 1 tablet by mouth 2 (two) times daily., Disp: , Rfl:    hydroxyurea (HYDREA) 500 MG capsule, Take 1 capsule (500 mg total) by mouth daily. May take with food to minimize GI side effects., Disp: 30 capsule, Rfl: 6   Multiple Vitamin (MULTIVITAMIN WITH MINERALS) TABS, Take 1 tablet by mouth daily., Disp: , Rfl:    sacubitril-valsartan (ENTRESTO) 24-26 MG, Take 1 tablet by mouth 2 (two) times daily., Disp: 60 tablet, Rfl: 11   spironolactone (ALDACTONE) 25 MG tablet, Take 1 tablet (25 mg total) by mouth daily., Disp: 30 tablet, Rfl: 11   Turmeric 500 MG CAPS, Take 1 capsule by mouth 2 (two) times daily., Disp: , Rfl:   Allergies: No Known Allergies  Past Medical History, Surgical history, Social history, and Family History were reviewed and updated.  Review of Systems: Review of Systems  Constitutional: Negative.   HENT:  Negative.    Eyes: Negative.   Respiratory: Negative.    Cardiovascular: Negative.   Gastrointestinal: Negative.   Endocrine: Negative.   Genitourinary: Negative.    Musculoskeletal: Negative.   Skin: Negative.  Neurological: Negative.   Hematological: Negative.   Psychiatric/Behavioral: Negative.      Physical Exam:  height is '4\' 11"'$  (1.499 m) and weight is 142 lb 12.8 oz (64.8 kg). Her oral temperature is 98.1 F (36.7 C). Her blood pressure is 94/63 and her pulse is 80. Her respiration is 20 and oxygen saturation is 99%.   Wt Readings from Last 3 Encounters:  12/10/21 142 lb 12.8 oz (64.8 kg)  10/29/21 144 lb 6.4 oz (65.5 kg)  10/29/21 145 lb (65.8 kg)    Physical Exam Vitals reviewed.  HENT:     Head: Normocephalic and atraumatic.  Eyes:     Pupils: Pupils are equal, round, and reactive to light.  Cardiovascular:     Rate and Rhythm: Normal rate and regular rhythm.     Heart sounds: Normal heart sounds.   Pulmonary:     Effort: Pulmonary effort is normal.     Breath sounds: Normal breath sounds.  Abdominal:     General: Bowel sounds are normal.     Palpations: Abdomen is soft.  Musculoskeletal:        General: No tenderness or deformity. Normal range of motion.     Cervical back: Normal range of motion.  Lymphadenopathy:     Cervical: No cervical adenopathy.  Skin:    General: Skin is warm and dry.     Findings: No erythema or rash.  Neurological:     Mental Status: She is alert and oriented to person, place, and time.  Psychiatric:        Behavior: Behavior normal.        Thought Content: Thought content normal.        Judgment: Judgment normal.      Lab Results  Component Value Date   WBC 3.0 (L) 12/10/2021   HGB 14.0 12/10/2021   HCT 44.4 12/10/2021   MCV 93.3 12/10/2021   PLT 438 (H) 12/10/2021     Chemistry      Component Value Date/Time   NA 141 10/29/2021 0755   K 4.5 10/29/2021 0755   CL 104 10/29/2021 0755   CO2 29 10/29/2021 0755   BUN 41 (H) 10/29/2021 0755   CREATININE 1.57 (H) 10/29/2021 0755      Component Value Date/Time   CALCIUM 10.6 (H) 10/29/2021 0755   ALKPHOS 72 10/29/2021 0755   AST 26 10/29/2021 0755   ALT 23 10/29/2021 0755   BILITOT 0.5 10/29/2021 0755      Impression and Plan: Tina Jenkins is a very charming 71 year old African-American female.  She has essential thrombocythemia.  This is proven by our molecular studies.  The Hydrea is doing quite nicely.  Her platelet count is coming down quite well.  Her white cell count is down a little bit.  However, this is still stable.  She is not anemic.  I am just happy that the Hydrea is working so well at a low dose.  We will not make any change in the dosing.  I think we can now get her back in 3 months.  This will get [place] between Thanksgiving and Christmas.     Volanda Napoleon, MD 9/1/20238:11 AM

## 2021-12-15 ENCOUNTER — Other Ambulatory Visit (HOSPITAL_COMMUNITY): Payer: Self-pay

## 2022-01-03 ENCOUNTER — Other Ambulatory Visit (HOSPITAL_COMMUNITY): Payer: Self-pay

## 2022-01-13 ENCOUNTER — Other Ambulatory Visit (HOSPITAL_COMMUNITY): Payer: Self-pay | Admitting: *Deleted

## 2022-01-13 MED ORDER — FUROSEMIDE 40 MG PO TABS: ORAL_TABLET | ORAL | 3 refills | Status: DC

## 2022-01-13 MED ORDER — FUROSEMIDE 40 MG PO TABS: 40.0000 mg | ORAL_TABLET | Freq: Every day | ORAL | 3 refills | Status: DC

## 2022-01-17 ENCOUNTER — Other Ambulatory Visit (HOSPITAL_COMMUNITY): Payer: Self-pay

## 2022-01-31 ENCOUNTER — Other Ambulatory Visit (HOSPITAL_COMMUNITY): Payer: Self-pay

## 2022-02-23 ENCOUNTER — Other Ambulatory Visit (HOSPITAL_COMMUNITY): Payer: Self-pay

## 2022-03-07 ENCOUNTER — Other Ambulatory Visit (HOSPITAL_BASED_OUTPATIENT_CLINIC_OR_DEPARTMENT_OTHER): Payer: Self-pay

## 2022-03-11 ENCOUNTER — Inpatient Hospital Stay (HOSPITAL_BASED_OUTPATIENT_CLINIC_OR_DEPARTMENT_OTHER): Payer: Medicare Other | Admitting: Hematology & Oncology

## 2022-03-11 ENCOUNTER — Inpatient Hospital Stay: Payer: Medicare Other | Attending: Hematology & Oncology

## 2022-03-11 ENCOUNTER — Encounter: Payer: Self-pay | Admitting: Hematology & Oncology

## 2022-03-11 VITALS — BP 114/61 | HR 51 | Temp 98.2°F | Resp 20 | Ht 59.0 in | Wt 146.8 lb

## 2022-03-11 DIAGNOSIS — D473 Essential (hemorrhagic) thrombocythemia: Secondary | ICD-10-CM | POA: Insufficient documentation

## 2022-03-11 LAB — CMP (CANCER CENTER ONLY)
ALT: 22 U/L (ref 0–44)
AST: 23 U/L (ref 15–41)
Albumin: 4.5 g/dL (ref 3.5–5.0)
Alkaline Phosphatase: 67 U/L (ref 38–126)
Anion gap: 10 (ref 5–15)
BUN: 76 mg/dL — ABNORMAL HIGH (ref 8–23)
CO2: 26 mmol/L (ref 22–32)
Calcium: 9.9 mg/dL (ref 8.9–10.3)
Chloride: 102 mmol/L (ref 98–111)
Creatinine: 1.85 mg/dL — ABNORMAL HIGH (ref 0.44–1.00)
GFR, Estimated: 29 mL/min — ABNORMAL LOW (ref >60)
Glucose, Bld: 135 mg/dL — ABNORMAL HIGH (ref 70–99)
Potassium: 4.7 mmol/L (ref 3.5–5.1)
Sodium: 138 mmol/L (ref 135–145)
Total Bilirubin: 0.4 mg/dL (ref 0.3–1.2)
Total Protein: 7.3 g/dL (ref 6.5–8.1)

## 2022-03-11 LAB — IRON AND IRON BINDING CAPACITY (CC-WL,HP ONLY)
Iron: 48 ug/dL (ref 28–170)
Saturation Ratios: 13 % (ref 10.4–31.8)
TIBC: 368 ug/dL (ref 250–450)
UIBC: 320 ug/dL (ref 148–442)

## 2022-03-11 LAB — CBC WITH DIFFERENTIAL (CANCER CENTER ONLY)
Abs Immature Granulocytes: 0.04 10*3/uL (ref 0.00–0.07)
Basophils Absolute: 0 10*3/uL (ref 0.0–0.1)
Basophils Relative: 0 %
Eosinophils Absolute: 0 10*3/uL (ref 0.0–0.5)
Eosinophils Relative: 0 %
HCT: 39.9 % (ref 36.0–46.0)
Hemoglobin: 12.7 g/dL (ref 12.0–15.0)
Immature Granulocytes: 0 %
Lymphocytes Relative: 9 %
Lymphs Abs: 0.9 10*3/uL (ref 0.7–4.0)
MCH: 32.2 pg (ref 26.0–34.0)
MCHC: 31.8 g/dL (ref 30.0–36.0)
MCV: 101.3 fL — ABNORMAL HIGH (ref 80.0–100.0)
Monocytes Absolute: 0.6 10*3/uL (ref 0.1–1.0)
Monocytes Relative: 6 %
Neutro Abs: 8.3 10*3/uL — ABNORMAL HIGH (ref 1.7–7.7)
Neutrophils Relative %: 85 %
Platelet Count: 618 10*3/uL — ABNORMAL HIGH (ref 150–400)
RBC: 3.94 MIL/uL (ref 3.87–5.11)
RDW: 14.4 % (ref 11.5–15.5)
WBC Count: 9.9 10*3/uL (ref 4.0–10.5)
nRBC: 0 % (ref 0.0–0.2)

## 2022-03-11 LAB — FERRITIN: Ferritin: 67 ng/mL (ref 11–307)

## 2022-03-11 LAB — SAVE SMEAR(SSMR), FOR PROVIDER SLIDE REVIEW

## 2022-03-11 LAB — LACTATE DEHYDROGENASE: LDH: 201 U/L — ABNORMAL HIGH (ref 98–192)

## 2022-03-11 NOTE — Progress Notes (Signed)
Hematology and Oncology Follow Up Visit  Tina Jenkins [id number] May 04, 1950 71 y.o. 03/11/2022   Principle Diagnosis:  Essential thrombocythemia-  JAK2+/CALR+  Current Therapy:   Hydrea 500 mg p.o. daily-start on 10/02/2021     Interim History:  Tina Jenkins is back for follow-up.  We last saw her back in September.  She did have COVID.  I think she had this around Thanksgiving.  She and her husband will be going up to [facility].  They are going on a tour.  They are leaving next week for 4 days.  She has had no issues with her heart.  She is on several cardiac medications.  She has had no cough or shortness of breath.  She has had no pain.  There is no bleeding.  There is no nausea or vomiting.  She has had no change in bowel or bladder habits.  She is on Hydrea.  She is doing pretty well on the Hydrea.  However, her platelet count is up today.  I will know if this might be from the recent COVID infection that she had.  Overall, I would say that her performance status is ECOG 1.     Medications:  Current Outpatient Medications:    aspirin EC 81 MG tablet, Take 81 mg by mouth daily., Disp: , Rfl:    atorvastatin (LIPITOR) 20 MG tablet, Take 20 mg by mouth at bedtime. , Disp: , Rfl:    Calcium Carbonate-Vit D-Min (CALCIUM 1200 PO), Take 1,200 mg by mouth 2 (two) times daily., Disp: , Rfl:    carvedilol (COREG) 6.25 MG tablet, Take 1 tablet (6.25 mg total) by mouth 2 (two) times daily., Disp: 180 tablet, Rfl: 3   dapagliflozin propanediol (FARXIGA) 10 MG TABS tablet, Take 1 tablet (10 mg total) by mouth daily before breakfast., Disp: 30 tablet, Rfl: 11   diphenhydramine-acetaminophen (TYLENOL PM) 25-500 MG TABS tablet, Take 1 tablet by mouth at bedtime as needed., Disp: , Rfl:    furosemide (LASIX) 40 MG tablet, TAKE 1 TABLET BY MOUTH 3 TIMES A WEEK ON MONDAY, WEDNESDAY, FRIDAY, Disp: 30 tablet, Rfl: 3   glucosamine-chondroitin 500-400 MG tablet, Take 1 tablet by mouth 2 (two) times  daily., Disp: , Rfl:    hydroxyurea (HYDREA) 500 MG capsule, Take 1 capsule (500 mg total) by mouth daily. May take with food to minimize GI side effects., Disp: 30 capsule, Rfl: 6   Multiple Vitamin (MULTIVITAMIN WITH MINERALS) TABS, Take 1 tablet by mouth daily., Disp: , Rfl:    sacubitril-valsartan (ENTRESTO) 24-26 MG, Take 1 tablet by mouth 2 (two) times daily., Disp: 60 tablet, Rfl: 11   spironolactone (ALDACTONE) 25 MG tablet, Take 1 tablet (25 mg total) by mouth daily., Disp: 30 tablet, Rfl: 11   Turmeric 500 MG CAPS, Take 1 capsule by mouth 2 (two) times daily., Disp: , Rfl:   Allergies: No Known Allergies  Past Medical History, Surgical history, Social history, and Family History were reviewed and updated.  Review of Systems: Review of Systems  Constitutional: Negative.   HENT:  Negative.    Eyes: Negative.   Respiratory: Negative.    Cardiovascular: Negative.   Gastrointestinal: Negative.   Endocrine: Negative.   Genitourinary: Negative.    Musculoskeletal: Negative.   Skin: Negative.   Neurological: Negative.   Hematological: Negative.   Psychiatric/Behavioral: Negative.      Physical Exam:  height is '4\' 11"'$  (1.499 m) and weight is 146 lb 12.8 oz (66.6 kg).  Her oral temperature is 98.2 F (36.8 C). Her blood pressure is 114/61 and her pulse is 51 (abnormal). Her respiration is 20 and oxygen saturation is 99%.   Wt Readings from Last 3 Encounters:  03/11/22 146 lb 12.8 oz (66.6 kg)  12/10/21 142 lb 12.8 oz (64.8 kg)  10/29/21 144 lb 6.4 oz (65.5 kg)    Physical Exam Vitals reviewed.  HENT:     Head: Normocephalic and atraumatic.  Eyes:     Pupils: Pupils are equal, round, and reactive to light.  Cardiovascular:     Rate and Rhythm: Normal rate and regular rhythm.     Heart sounds: Normal heart sounds.  Pulmonary:     Effort: Pulmonary effort is normal.     Breath sounds: Normal breath sounds.  Abdominal:     General: Bowel sounds are normal.      Palpations: Abdomen is soft.  Musculoskeletal:        General: No tenderness or deformity. Normal range of motion.     Cervical back: Normal range of motion.  Lymphadenopathy:     Cervical: No cervical adenopathy.  Skin:    General: Skin is warm and dry.     Findings: No erythema or rash.  Neurological:     Mental Status: She is alert and oriented to person, place, and time.  Psychiatric:        Behavior: Behavior normal.        Thought Content: Thought content normal.        Judgment: Judgment normal.      Lab Results  Component Value Date   WBC 9.9 03/11/2022   HGB 12.7 03/11/2022   HCT 39.9 03/11/2022   MCV 101.3 (H) 03/11/2022   PLT 618 (H) 03/11/2022     Chemistry      Component Value Date/Time   NA 138 03/11/2022 0808   K 4.7 03/11/2022 0808   CL 102 03/11/2022 0808   CO2 26 03/11/2022 0808   BUN 76 (H) 03/11/2022 0808   CREATININE 1.85 (H) 03/11/2022 0808      Component Value Date/Time   CALCIUM 9.9 03/11/2022 0808   ALKPHOS 67 03/11/2022 0808   AST 23 03/11/2022 0808   ALT 22 03/11/2022 0808   BILITOT 0.4 03/11/2022 0808      Impression and Plan: Tina Jenkins is a very charming 71 year old African-American female.  She has essential thrombocythemia.  This is proven by our molecular studies.  Again, her platelet count is elevated.  We will hold the Hydrea adjustment for right now.  I would like to see her platelet count does improve.  I am worried about her renal function.  Her BUN is up quite a bit.  She is on Entresto.  I really think she needs to see cardiology to have all of this adjusted.  Her blood pressure is not that bad.  She is on a couple diuretics so may be one of the diuretics can be stopped.  When had to have her come back in probably a month or so.  If her platelet count has not come down, we will increase the Hydrea dose to 500 mg p.o. twice daily.   Volanda Napoleon, MD 12/1/20239:10 AM

## 2022-03-16 ENCOUNTER — Ambulatory Visit (HOSPITAL_COMMUNITY)
Admission: RE | Admit: 2022-03-16 | Discharge: 2022-03-16 | Disposition: A | Discharge status: Home / Self Care | Payer: Medicare Other | Source: Ambulatory Visit | Attending: Internal Medicine | Admitting: Internal Medicine

## 2022-03-16 ENCOUNTER — Inpatient Hospital Stay (HOSPITAL_COMMUNITY)
Admission: RE | Admit: 2022-03-16 | Discharge: 2022-03-16 | Disposition: A | Discharge status: Home / Self Care | Payer: Medicare Other | Source: Ambulatory Visit | Attending: Internal Medicine | Admitting: Internal Medicine

## 2022-03-16 ENCOUNTER — Encounter (HOSPITAL_COMMUNITY): Payer: Self-pay | Admitting: Internal Medicine

## 2022-03-16 VITALS — BP 80/50 | HR 85 | Wt 145.4 lb

## 2022-03-16 DIAGNOSIS — I130 Hypertensive heart and chronic kidney disease with heart failure and stage 1 through stage 4 chronic kidney disease, or unspecified chronic kidney disease: Secondary | ICD-10-CM | POA: Diagnosis not present

## 2022-03-16 DIAGNOSIS — I509 Heart failure, unspecified: Secondary | ICD-10-CM

## 2022-03-16 DIAGNOSIS — N184 Chronic kidney disease, stage 4 (severe): Secondary | ICD-10-CM

## 2022-03-16 DIAGNOSIS — I493 Ventricular premature depolarization: Secondary | ICD-10-CM | POA: Insufficient documentation

## 2022-03-16 DIAGNOSIS — Z8616 Personal history of COVID-19: Secondary | ICD-10-CM | POA: Diagnosis not present

## 2022-03-16 DIAGNOSIS — Z79899 Other long term (current) drug therapy: Secondary | ICD-10-CM | POA: Diagnosis not present

## 2022-03-16 DIAGNOSIS — I5022 Chronic systolic (congestive) heart failure: Secondary | ICD-10-CM | POA: Insufficient documentation

## 2022-03-16 DIAGNOSIS — D75839 Thrombocytosis, unspecified: Secondary | ICD-10-CM | POA: Insufficient documentation

## 2022-03-16 DIAGNOSIS — Z8249 Family history of ischemic heart disease and other diseases of the circulatory system: Secondary | ICD-10-CM | POA: Diagnosis not present

## 2022-03-16 DIAGNOSIS — I428 Other cardiomyopathies: Secondary | ICD-10-CM | POA: Diagnosis not present

## 2022-03-16 DIAGNOSIS — I2510 Atherosclerotic heart disease of native coronary artery without angina pectoris: Secondary | ICD-10-CM | POA: Insufficient documentation

## 2022-03-16 DIAGNOSIS — N1832 Chronic kidney disease, stage 3b: Secondary | ICD-10-CM | POA: Diagnosis not present

## 2022-03-16 DIAGNOSIS — I10 Essential (primary) hypertension: Secondary | ICD-10-CM

## 2022-03-16 LAB — BASIC METABOLIC PANEL
Anion gap: 9 (ref 5–15)
BUN: 36 mg/dL — ABNORMAL HIGH (ref 8–23)
CO2: 24 mmol/L (ref 22–32)
Calcium: 9.8 mg/dL (ref 8.9–10.3)
Chloride: 105 mmol/L (ref 98–111)
Creatinine, Ser: 1.53 mg/dL — ABNORMAL HIGH (ref 0.44–1.00)
GFR, Estimated: 36 mL/min — ABNORMAL LOW (ref >60)
Glucose, Bld: 103 mg/dL — ABNORMAL HIGH (ref 70–99)
Potassium: 4.6 mmol/L (ref 3.5–5.1)
Sodium: 138 mmol/L (ref 135–145)

## 2022-03-16 LAB — CBC
HCT: 40.3 % (ref 36.0–46.0)
Hemoglobin: 12.9 g/dL (ref 12.0–15.0)
MCH: 32.6 pg (ref 26.0–34.0)
MCHC: 32 g/dL (ref 30.0–36.0)
MCV: 101.8 fL — ABNORMAL HIGH (ref 80.0–100.0)
Platelets: 656 10*3/uL — ABNORMAL HIGH (ref 150–400)
RBC: 3.96 MIL/uL (ref 3.87–5.11)
RDW: 14.8 % (ref 11.5–15.5)
WBC: 5.5 10*3/uL (ref 4.0–10.5)
nRBC: 0 % (ref 0.0–0.2)

## 2022-03-16 LAB — BRAIN NATRIURETIC PEPTIDE: B Natriuretic Peptide: 9.1 pg/mL (ref 0.0–100.0)

## 2022-03-16 MED ORDER — FUROSEMIDE 40 MG PO TABS: 40.0000 mg | ORAL_TABLET | ORAL | 3 refills | Status: DC

## 2022-03-16 NOTE — Addendum Note (Signed)
Encounter addended by: Jolaine Artist, MD on: 03/16/2022 10:14 AM  Actions taken: Clinical Note Signed

## 2022-03-16 NOTE — Patient Instructions (Addendum)
DECREASE Lasix to 40 mg twice a week on Monday and Friday  STOP Entresto  Labs today We will only contact you if something comes back abnormal or we need to make some changes. Otherwise no news is good news!  Your provider has recommended that  you wear a Zio Patch for 14 days.  This monitor will record your heart rhythm for our review.  IF you have any symptoms while wearing the monitor please press the button.  If you have any issues with the patch or you notice a red or orange light on it please call the company at [phone].  Once you remove the patch please mail it back to the company as soon as possible so we can get the results.   Your physician recommends that you schedule a follow-up appointment in: 3 months with Dr Haroldine Laws and echo  Your physician has requested that you have an echocardiogram. Echocardiography is a painless test that uses sound waves to create images of your heart. It provides your doctor with information about the size and shape of your heart and how well your heart's chambers and valves are working. This procedure takes approximately one hour. There are no restrictions for this procedure. Please do NOT wear cologne, perfume, aftershave, or lotions (deodorant is allowed). Please arrive 15 minutes prior to your appointment time.  Do the following things EVERYDAY: Weigh yourself in the morning before breakfast. Write it down and keep it in a log. Take your medicines as prescribed Eat low salt foods--Limit salt (sodium) to 2000 mg per day.  Stay as active as you can everyday Limit all fluids for the day to less than 2 liters  At the [facility], you and your health needs are our priority. As part of our continuing mission to provide you with exceptional heart care, we have created designated Provider Care Teams. These Care Teams include your primary Cardiologist (physician) and Advanced Practice Providers (APPs- Physician Assistants and Nurse  Practitioners) who all work together to provide you with the care you need, when you need it.   You may see any of the following providers on your designated Care Team at your next follow up: Dr Glori Bickers Dr Loralie Champagne Dr. Roxana Hires, NP Lyda Jester, [place] [facility], [place] Forestine Na, NP Audry Riles, PharmD   Please be sure to bring in all your medications bottles to every appointment.   If you have any questions or concerns before your next appointment please send [place] a message through [place] or call our office at [phone].    TO LEAVE A MESSAGE FOR THE NURSE SELECT OPTION 2, PLEASE LEAVE A MESSAGE INCLUDING: YOUR NAME DATE OF BIRTH CALL BACK NUMBER REASON FOR CALL**this is important as we prioritize the call backs  YOU WILL RECEIVE A CALL BACK THE SAME DAY AS LONG AS YOU CALL BEFORE 4:00 PM

## 2022-03-16 NOTE — Addendum Note (Signed)
Encounter addended by: Kerry Dory, CMA on: 03/16/2022 9:46 AM  Actions taken: Order list changed, Diagnosis association updated

## 2022-03-16 NOTE — Progress Notes (Addendum)
ADVANCED HF CLINIC NOTE  PCP: Kelton Pillar, MD Primary Cardiologist: Dr. Johney Frame  HF Cardiologist: Dr. Haroldine Laws Hematology:Dr Marin Olp    HPI: Tina Jenkins is a 71 y.o. AAF w/ h/o HTN controlled w/ medications, HLD, CKD IIIa, and chronic systolic heart failure/ NICM.    Has family h/o CHF and SCD. Her brother died at the age of 31 while running/playing. Her mother said he had a "bad heart". Her father also had CHF but older in life and lived until his 39s.    She had COVID in October 2022 but reports only mild symptoms.   Admitted 4/23 with new acute CHF. Had URI 2 weeks prior, COVID negative at that time. Diuresed w/ IV Lasix. Echo showed reduced LVEF, 35-40%, RV normal. Hs trop elevated not c/w acute myocarditis, 15>>19. Underwent R/LHC showing mild nonobstructive CAD, normal filling pressures and normal cardiac output.  GDMT initiated, referred to [facility], and discharged home with weight 146 lbs.    Seen in [facility] 4/23, feeling well. Stable NYHA I-II symptoms, euvolemic. Entresto started and Lasix changed to PRN.  cMRI (6/23) showed LVEF 36%, RVEF 44%, no LGE.  Echo 10/2021: EF 35-40% RV normal.   Today she returns for HF follow up.Overall feeling fine. She feels pretty good. Recently seen by Dr. Marin Olp and platelets back up from 400k -> 600K  Scr also up 1.57 -> 1.85. Eating a lot of red meat. No CP or SOB. Remains active. No edema. Compliant with meds. Occasionally dizzy at times. SBP 90-100 at home   Cardiac Testing   - cMRI (6/23): LVEF 36% moderate HK, RVEF 44%, no LGE  - Echo 10/2021: EF 35-40% RV normal.   - Echo (4/23): EF 35-40%, LV moderately decreased with global HK, moderate LVH, RV ok, mild to moderate MR  - R/LHC (4/23):  CO/CI (Fick) 4.51/2.78 L/min PA: 30/9 (mean 17) mmHg RA A: 3 mmHg RA V:1 mmHg RA mean (0) RVSP: 26 mmgHg LVEDP: 3 mmHg  25% distal left main. 25% ostial LAD. 25% ostial circumflex. Normal dominant right coronary. Normal right heart  pressures without pulmonary hypertension.  LVEDP 3 mmHg and mean wedge pressure 4 mmHg. Global left ventricular hypokinesis consistent with systolic heart failure, compensated. Etiology uncertain but illness was preceded by an upper respiratory infection and therefore could be postviral myocarditis. Father died of heart failure and therefore genetic predisposition is a consideration as well. Quadruple therapy with hopeful full recovery of LV function.   ROS: All systems reviewed and negative except as per HPI.   Past Medical History:  Diagnosis Date   Chronic kidney disease    kidney stones   Essential thrombocythemia ([place]) 10/01/2021   Hypercholesteremia    Hypertension    Nonischemic cardiomyopathy (HCC)    Systolic heart failure (HCC)    Current Outpatient Medications  Medication Sig Dispense Refill   aspirin EC 81 MG tablet Take 81 mg by mouth daily.     atorvastatin (LIPITOR) 20 MG tablet Take 20 mg by mouth at bedtime.      Calcium Carbonate-Vit D-Min (CALCIUM 1200 PO) Take 1,200 mg by mouth 2 (two) times daily.     carvedilol (COREG) 6.25 MG tablet Take 1 tablet (6.25 mg total) by mouth 2 (two) times daily. 180 tablet 3   dapagliflozin propanediol (FARXIGA) 10 MG TABS tablet Take 1 tablet (10 mg total) by mouth daily before breakfast. 30 tablet 11   diphenhydramine-acetaminophen (TYLENOL PM) 25-500 MG TABS tablet Take 1 tablet by mouth at  bedtime as needed.     glucosamine-chondroitin 500-400 MG tablet Take 1 tablet by mouth 2 (two) times daily.     hydroxyurea (HYDREA) 500 MG capsule Take 1 capsule (500 mg total) by mouth daily. May take with food to minimize GI side effects. 30 capsule 6   Multiple Vitamin (MULTIVITAMIN WITH MINERALS) TABS Take 1 tablet by mouth daily.     spironolactone (ALDACTONE) 25 MG tablet Take 1 tablet (25 mg total) by mouth daily. 30 tablet 11   Turmeric 500 MG CAPS Take 1 capsule by mouth 2 (two) times daily.     [START ON 03/17/2022] furosemide (LASIX)  40 MG tablet Take 1 tablet (40 mg total) by mouth 2 (two) times a week. Monday and Friday 30 tablet 3   No current facility-administered medications for this encounter.   No Known Allergies  Social History   Socioeconomic History   Marital status: Married    Spouse name: Chief Strategy Officer   Number of children: 1   Years of education: Not on file   Highest education level: High school graduate  Occupational History   Occupation: Retired    Comment: [employer]  Tobacco Use   Smoking status: Never   Smokeless tobacco: Not on file  Vaping Use   Vaping Use: Never used  Substance and Sexual Activity   Alcohol use: No   Drug use: No   Sexual activity: Not on file  Other Topics Concern   Not on file  Social History Narrative   Not on file   Social Determinants of Health   Financial Resource Strain: Low Risk  (07/26/2021)   Overall Financial Resource Strain (CARDIA)    Difficulty of Paying Living Expenses: Not hard at all  Food Insecurity: No Food Insecurity (07/26/2021)   Hunger Vital Sign    Worried About Running Out of Food in the Last Year: Never true    Ran Out of Food in the Last Year: Never true  Transportation Needs: No Transportation Needs (07/26/2021)   PRAPARE - Hydrologist (Medical): No    Lack of Transportation (Non-Medical): No  Physical Activity: Not on file  Stress: Not on file  Social Connections: Not on file  Intimate Partner Violence: Not on file   Family History  Problem Relation Age of Onset   Heart failure Father    Sudden Cardiac Death Brother 12   BP (!) 80/50   Pulse 85   Wt 66 kg (145 lb 6.4 oz)   SpO2 97%   BMI 29.37 kg/m   Wt Readings from Last 3 Encounters:  03/16/22 66 kg (145 lb 6.4 oz)  03/11/22 66.6 kg (146 lb 12.8 oz)  12/10/21 64.8 kg (142 lb 12.8 oz)   PHYSICAL EXAM: General:  Well appearing. No resp difficulty HEENT: normal Neck: supple. no JVD. Carotids 2+ bilat; no bruits. No lymphadenopathy or thryomegaly  appreciated. Cor: PMI nondisplaced. Regular rate & rhythm. No rubs, gallops or murmurs. Lungs: clear Abdomen: soft, nontender, nondistended. No hepatosplenomegaly. No bruits or masses. Good bowel sounds. Extremities: no cyanosis, clubbing, rash, edema Neuro: alert & orientedx3, cranial nerves grossly intact. moves all 4 extremities w/o difficulty. Affect pleasant  ECG: NSR 88 1 PVC   ASSESSMENT & PLAN: Chronic Systolic Heart Failure - NICM. - Echo (4/23): EF 35-40%, RV normal - L/RHC (4/23): w/ mild obstructive CAD, normal filling pressures and normal CO  - Etiology uncertain, but acute CHF exacerbation was preceded by an upper respiratory infection and  therefore could be postviral myocarditis. Doubt acute given Hs trop trend but did have COVID 10/22.  - Also ? Familial CM given family history. Father died from HF.  - cMRI October 05, 2022) LVEF 36%, RVEF 44%, no LGE. - Echo 7/23  EF 35-40% RV normal . Out of range for ICD - NYHA I-II. Volume status stable. Currently on lasix M-W-F. With rising Scr will drop lasix to M/F only and take extra PRN.  - Continue Coreg 6.25 mg bid. - BP low. Stop Entresto 24-26 mg bid.  - Continue Farxiga 10 mg daily.  - Continue spironolactone 25 mg daily.  - Labs today - F/u NP/PA 2 weeks  2. Hypertension  - BP low. Plan as above.    3. Stage IIIb-IV CKD  - Continue Farxiga 10 mg daily.  - Creatinine today 1.5- 1.8 - last check 1.85 on 03/11/22 - Recheck today. Will cut lasix back   4. 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 Follows with Heme/Onc  - Recheck CBC today  5. PVCs - quantify with Zio  Glori Bickers, MD  10:13 AM

## 2022-03-16 NOTE — Addendum Note (Signed)
Encounter addended by: Kerry Dory, CMA on: 03/16/2022 9:44 AM  Actions taken: Pharmacy for encounter modified, Visit diagnoses modified, Order list changed, Diagnosis association updated, Clinical Note Signed

## 2022-03-21 ENCOUNTER — Other Ambulatory Visit (HOSPITAL_BASED_OUTPATIENT_CLINIC_OR_DEPARTMENT_OTHER): Payer: Self-pay

## 2022-04-05 NOTE — Progress Notes (Signed)
ADVANCED HF CLINIC NOTE  PCP: Kelton Pillar, MD Primary Cardiologist: Dr. Johney Frame  HF Cardiologist: Dr. Haroldine Laws Hematology:Dr Marin Olp    HPI: Tina Jenkins is a 71 y.o. AAF w/ h/o HTN controlled w/ medications, HLD, CKD IIIa, and chronic systolic heart failure/ NICM.    Has family h/o CHF and SCD. Her brother died at the age of 72 while running/playing. Her mother said he had a "bad heart". Her father also had CHF but older in life and lived until his 81s.    She had COVID in October 2022 but reports only mild symptoms.   Admitted 4/23 with new acute CHF. Had URI 2 weeks prior, COVID negative at that time. Diuresed w/ IV Lasix. Echo showed reduced LVEF, 35-40%, RV normal. Hs trop elevated not c/w acute myocarditis, 15>>19. Underwent R/LHC showing mild nonobstructive CAD, normal filling pressures and normal cardiac output.  GDMT initiated, referred to [facility], and discharged home with weight 146 lbs.    Seen in [facility] 4/23, feeling well. Stable NYHA I-II symptoms, euvolemic. Entresto started and Lasix changed to PRN.  cMRI (6/23) showed LVEF 36%, RVEF 44%, no LGE.  Echo 10/2021: EF 35-40% RV normal.   Today she returns for HF follow up. Last visit entresto stopped due to hypotension. Overall feeling fine. Denies SOB/PND/Orthopnea. Appetite ok. No fever or chills. Weight at home  pounds. Taking all medications.  Cardiac Testing  - cMRI (6/23): LVEF 36% moderate HK, RVEF 44%, no LGE  - Echo 10/2021: EF 35-40% RV normal. - Echo (4/23): EF 35-40%, LV moderately decreased with global HK, moderate LVH, RV ok, mild to moderate MR  - R/LHC (4/23): CO/CI (Fick) 4.51/2.78 L/min PA: 30/9 (mean 17) mmHg RA A: 3 mmHg RA V:1 mmHg RA mean (0) RVSP: 26 mmgHg LVEDP: 3 mmHg  25% distal left main. 25% ostial LAD. 25% ostial circumflex. Normal dominant right coronary. Normal right heart pressures without pulmonary hypertension.  LVEDP 3 mmHg and mean wedge pressure 4 mmHg. Global left  ventricular hypokinesis consistent with systolic heart failure, compensated. Etiology uncertain but illness was preceded by an upper respiratory infection and therefore could be postviral myocarditis. Father died of heart failure and therefore genetic predisposition is a consideration as well. Quadruple therapy with hopeful full recovery of LV function.   ROS: All systems reviewed and negative except as per HPI.   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     atorvastatin (LIPITOR) 20 MG tablet Take 20 mg by mouth at bedtime.      Calcium Carbonate-Vit D-Min (CALCIUM 1200 PO) Take 1,200 mg by mouth 2 (two) times daily.     carvedilol (COREG) 6.25 MG tablet Take 1 tablet (6.25 mg total) by mouth 2 (two) times daily. 180 tablet 3   dapagliflozin propanediol (FARXIGA) 10 MG TABS tablet Take 1 tablet (10 mg total) by mouth daily before breakfast. 30 tablet 11   diphenhydramine-acetaminophen (TYLENOL PM) 25-500 MG TABS tablet Take 1 tablet by mouth at bedtime as needed.     furosemide (LASIX) 40 MG tablet Take 1 tablet (40 mg total) by mouth 2 (two) times a week. Monday and Friday 30 tablet  3   glucosamine-chondroitin 500-400 MG tablet Take 1 tablet by mouth 2 (two) times daily.     hydroxyurea (HYDREA) 500 MG capsule Take 1 capsule (500 mg total) by mouth daily. May take with food to minimize GI side effects. 30 capsule 6   Multiple Vitamin (MULTIVITAMIN WITH MINERALS) TABS Take 1 tablet by mouth daily.     spironolactone (ALDACTONE) 25 MG tablet Take 1 tablet (25 mg total) by mouth daily. 30 tablet 11   Turmeric 500 MG CAPS Take 1 capsule by mouth 2 (two) times daily.     No current  facility-administered medications for this visit.   No Known Allergies  Social History   Socioeconomic History   Marital status: Married    Spouse name: Chief Strategy Officer   Number of children: 1   Years of education: Not on file   Highest education level: High school graduate  Occupational History   Occupation: Retired    Comment: [employer]  Tobacco Use   Smoking status: Never   Smokeless tobacco: Not on file  Vaping Use   Vaping Use: Never used  Substance and Sexual Activity   Alcohol use: No   Drug use: No   Sexual activity: Not on file  Other Topics Concern   Not on file  Social History Narrative   Not on file   Social Determinants of Health   Financial Resource Strain: Low Risk  (07/26/2021)   Overall Financial Resource Strain (CARDIA)    Difficulty of Paying Living Expenses: Not hard at all  Food Insecurity: No Food Insecurity (07/26/2021)   Hunger Vital Sign    Worried About Running Out of Food in the Last Year: Never true    Ran Out of Food in the Last Year: Never true  Transportation Needs: No Transportation Needs (07/26/2021)   PRAPARE - Hydrologist (Medical): No    Lack of Transportation (Non-Medical): No  Physical Activity: Not on file  Stress: Not on file  Social Connections: Not on file  Intimate Partner Violence: Not on file   Family History  Problem Relation Age of Onset   Heart failure Father    Sudden Cardiac Death Brother 12   There were no vitals taken for this visit.  Wt Readings from Last 3 Encounters:  03/16/22 66 kg (145 lb 6.4 oz)  03/11/22 66.6 kg (146 lb 12.8 oz)  12/10/21 64.8 kg (142 lb 12.8 oz)   PHYSICAL EXAM: General:  Well appearing. No resp difficulty HEENT: normal Neck: supple. no JVD. Carotids 2+ bilat; no bruits. No lymphadenopathy or thryomegaly appreciated. Cor: PMI nondisplaced. Regular rate & rhythm. No rubs, gallops or murmurs. Lungs: clear Abdomen: soft, nontender, nondistended. No hepatosplenomegaly.  No bruits or masses. Good bowel sounds. Extremities: no cyanosis, clubbing, rash, edema Neuro: alert & orientedx3, cranial nerves grossly intact. moves all 4 extremities w/o difficulty. Affect pleasant   ASSESSMENT & PLAN: Chronic Systolic Heart Failure - NICM. - Echo (4/23): EF 35-40%, RV normal - L/RHC (4/23): w/ mild obstructive CAD, normal filling pressures and normal CO  - Etiology uncertain, but acute CHF exacerbation was preceded by an upper respiratory infection and therefore could be postviral myocarditis. Doubt acute given Hs trop trend but did have COVID 10/22.  - Also ? Familial CM given family history. Father died from HF.  - cMRI 2022/10/12) LVEF 36%, RVEF 44%, no LGE. - Echo 7/23  EF 35-40% RV normal . Out of range for ICD - NYHA I-II. Volume  status stable. Currently on lasix M-W-F. With rising Scr will drop lasix to M/F only and take extra PRN.  - Continue Coreg 6.25 mg bid. - Off entresto with low BP.   - Continue Farxiga 10 mg daily.  - Continue spironolactone 25 mg daily.    2. Hypertension  -   3. Stage IIIb-IV CKD  - Continue Farxiga 10 mg daily.  - Creatinine today 1.5- 1.8 - last check 1.85 on 03/11/22   4. 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 Follows with Heme/Onc   5. PVCs - quantify with [facility]  Darrick Grinder, NP  5:19 PM

## 2022-04-06 ENCOUNTER — Ambulatory Visit (HOSPITAL_COMMUNITY)
Admission: RE | Admit: 2022-04-06 | Discharge: 2022-04-06 | Disposition: A | Discharge status: Home / Self Care | Payer: Medicare Other | Source: Ambulatory Visit | Attending: Adult Health | Admitting: Adult Health

## 2022-04-06 VITALS — BP 118/80 | HR 76 | Wt 149.0 lb

## 2022-04-06 DIAGNOSIS — N184 Chronic kidney disease, stage 4 (severe): Secondary | ICD-10-CM | POA: Diagnosis not present

## 2022-04-06 DIAGNOSIS — I10 Essential (primary) hypertension: Secondary | ICD-10-CM

## 2022-04-06 DIAGNOSIS — I5022 Chronic systolic (congestive) heart failure: Secondary | ICD-10-CM | POA: Diagnosis not present

## 2022-04-06 NOTE — Patient Instructions (Signed)
Continue current medications  Your physician recommends that you schedule a follow-up appointment in: 06/17/22 as scheduled  If you have any questions or concerns before your next appointment please send [place] a message through [place] or call our office at [phone].    TO LEAVE A MESSAGE FOR THE NURSE SELECT OPTION 2, PLEASE LEAVE A MESSAGE INCLUDING: YOUR NAME DATE OF BIRTH CALL BACK NUMBER REASON FOR CALL**this is important as we prioritize the call backs  YOU WILL RECEIVE A CALL BACK THE SAME DAY AS LONG AS YOU CALL BEFORE 4:00 PM

## 2022-04-06 NOTE — Addendum Note (Signed)
Encounter addended by: Micki Riley, RN on: 04/06/2022 4:45 PM  Actions taken: Imaging Exam ended

## 2022-04-18 ENCOUNTER — Other Ambulatory Visit: Payer: Self-pay

## 2022-04-18 ENCOUNTER — Inpatient Hospital Stay: Payer: Medicare Other | Attending: Hematology & Oncology

## 2022-04-18 ENCOUNTER — Encounter: Payer: Self-pay | Admitting: Hematology & Oncology

## 2022-04-18 ENCOUNTER — Inpatient Hospital Stay: Payer: Medicare Other | Admitting: Hematology & Oncology

## 2022-04-18 VITALS — BP 107/70 | HR 81 | Temp 98.3°F | Resp 16 | Ht 59.0 in | Wt 149.0 lb

## 2022-04-18 DIAGNOSIS — D473 Essential (hemorrhagic) thrombocythemia: Secondary | ICD-10-CM | POA: Diagnosis present

## 2022-04-18 DIAGNOSIS — Z79899 Other long term (current) drug therapy: Secondary | ICD-10-CM | POA: Diagnosis not present

## 2022-04-18 LAB — FERRITIN: Ferritin: 34 ng/mL (ref 11–307)

## 2022-04-18 LAB — SAVE SMEAR(SSMR), FOR PROVIDER SLIDE REVIEW

## 2022-04-18 LAB — CBC WITH DIFFERENTIAL (CANCER CENTER ONLY)
Abs Immature Granulocytes: 0.05 10*3/uL (ref 0.00–0.07)
Basophils Absolute: 0 10*3/uL (ref 0.0–0.1)
Basophils Relative: 1 %
Eosinophils Absolute: 0.1 10*3/uL (ref 0.0–0.5)
Eosinophils Relative: 3 %
HCT: 43.2 % (ref 36.0–46.0)
Hemoglobin: 13.5 g/dL (ref 12.0–15.0)
Immature Granulocytes: 2 %
Lymphocytes Relative: 24 %
Lymphs Abs: 0.8 10*3/uL (ref 0.7–4.0)
MCH: 32.8 pg (ref 26.0–34.0)
MCHC: 31.3 g/dL (ref 30.0–36.0)
MCV: 104.9 fL — ABNORMAL HIGH (ref 80.0–100.0)
Monocytes Absolute: 0.4 10*3/uL (ref 0.1–1.0)
Monocytes Relative: 13 %
Neutro Abs: 1.9 10*3/uL (ref 1.7–7.7)
Neutrophils Relative %: 57 %
Platelet Count: 496 10*3/uL — ABNORMAL HIGH (ref 150–400)
RBC: 4.12 MIL/uL (ref 3.87–5.11)
RDW: 13.7 % (ref 11.5–15.5)
WBC Count: 3.2 10*3/uL — ABNORMAL LOW (ref 4.0–10.5)
nRBC: 0 % (ref 0.0–0.2)

## 2022-04-18 LAB — CMP (CANCER CENTER ONLY)
ALT: 18 U/L (ref 0–44)
AST: 22 U/L (ref 15–41)
Albumin: 4.4 g/dL (ref 3.5–5.0)
Alkaline Phosphatase: 64 U/L (ref 38–126)
Anion gap: 10 (ref 5–15)
BUN: 29 mg/dL — ABNORMAL HIGH (ref 8–23)
CO2: 27 mmol/L (ref 22–32)
Calcium: 9.1 mg/dL (ref 8.9–10.3)
Chloride: 105 mmol/L (ref 98–111)
Creatinine: 1.45 mg/dL — ABNORMAL HIGH (ref 0.44–1.00)
GFR, Estimated: 38 mL/min — ABNORMAL LOW (ref >60)
Glucose, Bld: 110 mg/dL — ABNORMAL HIGH (ref 70–99)
Potassium: 4.1 mmol/L (ref 3.5–5.1)
Sodium: 142 mmol/L (ref 135–145)
Total Bilirubin: 0.5 mg/dL (ref 0.3–1.2)
Total Protein: 7.2 g/dL (ref 6.5–8.1)

## 2022-04-18 LAB — IRON AND IRON BINDING CAPACITY (CC-WL,HP ONLY)
Iron: 95 ug/dL (ref 28–170)
Saturation Ratios: 25 % (ref 10.4–31.8)
TIBC: 382 ug/dL (ref 250–450)
UIBC: 287 ug/dL (ref 148–442)

## 2022-04-18 LAB — LACTATE DEHYDROGENASE: LDH: 196 U/L — ABNORMAL HIGH (ref 98–192)

## 2022-04-18 NOTE — Progress Notes (Signed)
Hematology and Oncology Follow Up Visit  Tina Jenkins [id number] [birth year] 72 y.o. 04/18/2022   Principle Diagnosis:  Essential thrombocythemia-  JAK2+/CALR+  Current Therapy:   Hydrea 500 mg p.o. daily-start on 10/02/2021     Interim History:  Tina Jenkins is back for follow-up.  As always, she comes in with her husband.  That a wonderful time up in [place].  They enjoy themselves up there.  They are on a tour.  She had the Entresto stopped.  Hopefully, this will improve her renal function.  She is having no problems with the Hydrea.  There is no nausea or vomiting.  She has had no cough or shortness of breath.  She has had no change in bowel or bladder habits.  There has been no bleeding.  She has had no rashes.  She has had no leg swelling.  Her iron studies that we did back in December showed a ferritin of 67 with iron saturation of 13%.  Thankfully, she has had no problems with COVID or Influenza.  Overall, I would say performance status is ECOG 1.   Medications:  Current Outpatient Medications:    aspirin EC 81 MG tablet, Take 81 mg by mouth daily., Disp: , Rfl:    atorvastatin (LIPITOR) 20 MG tablet, Take 20 mg by mouth at bedtime. , Disp: , Rfl:    Calcium Carbonate-Vit D-Min (CALCIUM 1200 PO), Take 1,200 mg by mouth 2 (two) times daily., Disp: , Rfl:    carvedilol (COREG) 6.25 MG tablet, Take 1 tablet (6.25 mg total) by mouth 2 (two) times daily., Disp: 180 tablet, Rfl: 3   dapagliflozin propanediol (FARXIGA) 10 MG TABS tablet, Take 1 tablet (10 mg total) by mouth daily before breakfast., Disp: 30 tablet, Rfl: 11   diphenhydramine-acetaminophen (TYLENOL PM) 25-500 MG TABS tablet, Take 1 tablet by mouth at bedtime as needed., Disp: , Rfl:    furosemide (LASIX) 40 MG tablet, Take 1 tablet (40 mg total) by mouth 2 (two) times a week. Monday and Friday, Disp: 30 tablet, Rfl: 3   glucosamine-chondroitin 500-400 MG tablet, Take 1 tablet by mouth 2 (two) times daily., Disp: ,  Rfl:    hydroxyurea (HYDREA) 500 MG capsule, Take 1 capsule (500 mg total) by mouth daily. May take with food to minimize GI side effects., Disp: 30 capsule, Rfl: 6   Multiple Vitamin (MULTIVITAMIN WITH MINERALS) TABS, Take 1 tablet by mouth daily., Disp: , Rfl:    spironolactone (ALDACTONE) 25 MG tablet, Take 1 tablet (25 mg total) by mouth daily., Disp: 30 tablet, Rfl: 11   Turmeric 500 MG CAPS, Take 1 capsule by mouth 2 (two) times daily., Disp: , Rfl:   Allergies: No Known Allergies  Past Medical History, Surgical history, Social history, and Family History were reviewed and updated.  Review of Systems: Review of Systems  Constitutional: Negative.   HENT:  Negative.    Eyes: Negative.   Respiratory: Negative.    Cardiovascular: Negative.   Gastrointestinal: Negative.   Endocrine: Negative.   Genitourinary: Negative.    Musculoskeletal: Negative.   Skin: Negative.   Neurological: Negative.   Hematological: Negative.   Psychiatric/Behavioral: Negative.      Physical Exam:  height is '4\' 11"'$  (1.499 m) and weight is 149 lb (67.6 kg). Her oral temperature is 98.3 F (36.8 C). Her blood pressure is 107/70 and her pulse is 81. Her respiration is 16 and oxygen saturation is 100%.   Wt Readings from Last 3  Encounters:  04/18/22 149 lb (67.6 kg)  04/06/22 149 lb (67.6 kg)  03/16/22 145 lb 6.4 oz (66 kg)    Physical Exam Vitals reviewed.  HENT:     Head: Normocephalic and atraumatic.  Eyes:     Pupils: Pupils are equal, round, and reactive to light.  Cardiovascular:     Rate and Rhythm: Normal rate and regular rhythm.     Heart sounds: Normal heart sounds.  Pulmonary:     Effort: Pulmonary effort is normal.     Breath sounds: Normal breath sounds.  Abdominal:     General: Bowel sounds are normal.     Palpations: Abdomen is soft.  Musculoskeletal:        General: No tenderness or deformity. Normal range of motion.     Cervical back: Normal range of motion.   Lymphadenopathy:     Cervical: No cervical adenopathy.  Skin:    General: Skin is warm and dry.     Findings: No erythema or rash.  Neurological:     Mental Status: She is alert and oriented to person, place, and time.  Psychiatric:        Behavior: Behavior normal.        Thought Content: Thought content normal.        Judgment: Judgment normal.      Lab Results  Component Value Date   WBC 3.2 (L) 04/18/2022   HGB 13.5 04/18/2022   HCT 43.2 04/18/2022   MCV 104.9 (H) 04/18/2022   PLT 496 (H) 04/18/2022     Chemistry      Component Value Date/Time   NA 138 03/16/2022 0942   K 4.6 03/16/2022 0942   CL 105 03/16/2022 0942   CO2 24 03/16/2022 0942   BUN 36 (H) 03/16/2022 0942   CREATININE 1.53 (H) 03/16/2022 0942   CREATININE 1.85 (H) 03/11/2022 0808      Component Value Date/Time   CALCIUM 9.8 03/16/2022 0942   ALKPHOS 67 03/11/2022 0808   AST 23 03/11/2022 0808   ALT 22 03/11/2022 0808   BILITOT 0.4 03/11/2022 0808      Impression and Plan: Tina Jenkins is a very charming 72 year old African-American female.  She has essential thrombocythemia.  This is proven by our molecular studies.  Her platelet count is better.  I am happy about that.  We will see what her renal function is today.  Will be interested to see what her iron studies also look like.  Some of the thrombocytosis could be from low iron.  We will plan to get her back now in about 3 months.  Will try to get her through the Winter.    Volanda Napoleon, MD 1/8/20248:26 AM

## 2022-04-20 ENCOUNTER — Other Ambulatory Visit (HOSPITAL_BASED_OUTPATIENT_CLINIC_OR_DEPARTMENT_OTHER): Payer: Self-pay

## 2022-04-20 ENCOUNTER — Other Ambulatory Visit (HOSPITAL_COMMUNITY): Payer: Self-pay

## 2022-04-21 ENCOUNTER — Other Ambulatory Visit: Payer: Self-pay | Admitting: Hematology & Oncology

## 2022-04-21 ENCOUNTER — Other Ambulatory Visit: Payer: Self-pay

## 2022-04-22 ENCOUNTER — Other Ambulatory Visit: Payer: Self-pay

## 2022-04-22 ENCOUNTER — Other Ambulatory Visit (HOSPITAL_COMMUNITY): Payer: Self-pay

## 2022-04-27 ENCOUNTER — Other Ambulatory Visit (HOSPITAL_BASED_OUTPATIENT_CLINIC_OR_DEPARTMENT_OTHER): Payer: Self-pay

## 2022-04-27 ENCOUNTER — Other Ambulatory Visit (HOSPITAL_COMMUNITY): Payer: Self-pay

## 2022-05-11 ENCOUNTER — Other Ambulatory Visit (HOSPITAL_COMMUNITY): Payer: Self-pay

## 2022-05-20 ENCOUNTER — Telehealth (HOSPITAL_COMMUNITY): Payer: Self-pay | Admitting: *Deleted

## 2022-05-25 ENCOUNTER — Other Ambulatory Visit (HOSPITAL_BASED_OUTPATIENT_CLINIC_OR_DEPARTMENT_OTHER): Payer: Self-pay

## 2022-06-17 ENCOUNTER — Encounter (HOSPITAL_COMMUNITY): Payer: Self-pay | Admitting: Internal Medicine

## 2022-06-17 ENCOUNTER — Inpatient Hospital Stay (HOSPITAL_BASED_OUTPATIENT_CLINIC_OR_DEPARTMENT_OTHER): Payer: Medicare Other | Admitting: Oncology

## 2022-06-17 ENCOUNTER — Encounter: Payer: Self-pay | Admitting: Oncology

## 2022-06-17 ENCOUNTER — Other Ambulatory Visit: Payer: Self-pay

## 2022-06-17 ENCOUNTER — Inpatient Hospital Stay: Payer: Medicare Other | Attending: Hematology & Oncology

## 2022-06-17 ENCOUNTER — Ambulatory Visit (HOSPITAL_COMMUNITY)
Admission: RE | Admit: 2022-06-17 | Discharge: 2022-06-17 | Disposition: A | Discharge status: Home / Self Care | Payer: Medicare Other | Source: Ambulatory Visit | Attending: Internal Medicine | Admitting: Internal Medicine

## 2022-06-17 ENCOUNTER — Ambulatory Visit (HOSPITAL_BASED_OUTPATIENT_CLINIC_OR_DEPARTMENT_OTHER)
Admission: RE | Admit: 2022-06-17 | Discharge: 2022-06-17 | Disposition: A | Discharge status: Home / Self Care | Payer: Medicare Other | Source: Ambulatory Visit | Attending: Internal Medicine | Admitting: Internal Medicine

## 2022-06-17 VITALS — BP 109/67 | HR 78 | Temp 98.0°F | Resp 18

## 2022-06-17 VITALS — BP 100/60 | HR 75 | Wt 143.0 lb

## 2022-06-17 DIAGNOSIS — J069 Acute upper respiratory infection, unspecified: Secondary | ICD-10-CM | POA: Diagnosis not present

## 2022-06-17 DIAGNOSIS — Z7984 Long term (current) use of oral hypoglycemic drugs: Secondary | ICD-10-CM | POA: Diagnosis not present

## 2022-06-17 DIAGNOSIS — I959 Hypotension, unspecified: Secondary | ICD-10-CM | POA: Diagnosis not present

## 2022-06-17 DIAGNOSIS — D473 Essential (hemorrhagic) thrombocythemia: Secondary | ICD-10-CM

## 2022-06-17 DIAGNOSIS — Z79899 Other long term (current) drug therapy: Secondary | ICD-10-CM | POA: Insufficient documentation

## 2022-06-17 DIAGNOSIS — Z7982 Long term (current) use of aspirin: Secondary | ICD-10-CM | POA: Diagnosis not present

## 2022-06-17 DIAGNOSIS — I10 Essential (primary) hypertension: Secondary | ICD-10-CM

## 2022-06-17 DIAGNOSIS — I130 Hypertensive heart and chronic kidney disease with heart failure and stage 1 through stage 4 chronic kidney disease, or unspecified chronic kidney disease: Secondary | ICD-10-CM | POA: Diagnosis not present

## 2022-06-17 DIAGNOSIS — N1831 Chronic kidney disease, stage 3a: Secondary | ICD-10-CM | POA: Diagnosis not present

## 2022-06-17 DIAGNOSIS — N1832 Chronic kidney disease, stage 3b: Secondary | ICD-10-CM | POA: Insufficient documentation

## 2022-06-17 DIAGNOSIS — I5022 Chronic systolic (congestive) heart failure: Secondary | ICD-10-CM | POA: Diagnosis present

## 2022-06-17 DIAGNOSIS — E7800 Pure hypercholesterolemia, unspecified: Secondary | ICD-10-CM | POA: Insufficient documentation

## 2022-06-17 DIAGNOSIS — D75839 Thrombocytosis, unspecified: Secondary | ICD-10-CM | POA: Insufficient documentation

## 2022-06-17 DIAGNOSIS — Z8616 Personal history of COVID-19: Secondary | ICD-10-CM | POA: Diagnosis not present

## 2022-06-17 DIAGNOSIS — I493 Ventricular premature depolarization: Secondary | ICD-10-CM | POA: Diagnosis not present

## 2022-06-17 DIAGNOSIS — Z8249 Family history of ischemic heart disease and other diseases of the circulatory system: Secondary | ICD-10-CM | POA: Diagnosis not present

## 2022-06-17 DIAGNOSIS — I2510 Atherosclerotic heart disease of native coronary artery without angina pectoris: Secondary | ICD-10-CM | POA: Diagnosis not present

## 2022-06-17 DIAGNOSIS — Z87442 Personal history of urinary calculi: Secondary | ICD-10-CM | POA: Insufficient documentation

## 2022-06-17 DIAGNOSIS — I472 Ventricular tachycardia, unspecified: Secondary | ICD-10-CM | POA: Diagnosis not present

## 2022-06-17 DIAGNOSIS — I428 Other cardiomyopathies: Secondary | ICD-10-CM | POA: Insufficient documentation

## 2022-06-17 LAB — CBC WITH DIFFERENTIAL (CANCER CENTER ONLY)
Abs Immature Granulocytes: 0.02 10*3/uL (ref 0.00–0.07)
Basophils Absolute: 0 10*3/uL (ref 0.0–0.1)
Basophils Relative: 0 %
Eosinophils Absolute: 0.2 10*3/uL (ref 0.0–0.5)
Eosinophils Relative: 5 %
HCT: 44 % (ref 36.0–46.0)
Hemoglobin: 13.8 g/dL (ref 12.0–15.0)
Immature Granulocytes: 1 %
Lymphocytes Relative: 31 %
Lymphs Abs: 1 10*3/uL (ref 0.7–4.0)
MCH: 31.9 pg (ref 26.0–34.0)
MCHC: 31.4 g/dL (ref 30.0–36.0)
MCV: 101.9 fL — ABNORMAL HIGH (ref 80.0–100.0)
Monocytes Absolute: 0.6 10*3/uL (ref 0.1–1.0)
Monocytes Relative: 19 %
Neutro Abs: 1.5 10*3/uL — ABNORMAL LOW (ref 1.7–7.7)
Neutrophils Relative %: 44 %
Platelet Count: 415 10*3/uL — ABNORMAL HIGH (ref 150–400)
RBC: 4.32 MIL/uL (ref 3.87–5.11)
RDW: 13.5 % (ref 11.5–15.5)
WBC Count: 3.3 10*3/uL — ABNORMAL LOW (ref 4.0–10.5)
nRBC: 0 % (ref 0.0–0.2)

## 2022-06-17 LAB — CMP (CANCER CENTER ONLY)
ALT: 21 U/L (ref 0–44)
AST: 24 U/L (ref 15–41)
Albumin: 4.5 g/dL (ref 3.5–5.0)
Alkaline Phosphatase: 62 U/L (ref 38–126)
Anion gap: 9 (ref 5–15)
BUN: 32 mg/dL — ABNORMAL HIGH (ref 8–23)
CO2: 28 mmol/L (ref 22–32)
Calcium: 11 mg/dL — ABNORMAL HIGH (ref 8.9–10.3)
Chloride: 104 mmol/L (ref 98–111)
Creatinine: 1.75 mg/dL — ABNORMAL HIGH (ref 0.44–1.00)
GFR, Estimated: 31 mL/min — ABNORMAL LOW (ref >60)
Glucose, Bld: 102 mg/dL — ABNORMAL HIGH (ref 70–99)
Potassium: 4.4 mmol/L (ref 3.5–5.1)
Sodium: 141 mmol/L (ref 135–145)
Total Bilirubin: 0.5 mg/dL (ref 0.3–1.2)
Total Protein: 7.3 g/dL (ref 6.5–8.1)

## 2022-06-17 LAB — ECHOCARDIOGRAM COMPLETE
AR max vel: 1.83 cm2
AV Area VTI: 1.73 cm2
AV Area mean vel: 1.63 cm2
AV Mean grad: 3 mmHg
AV Peak grad: 5.7 mmHg
Ao pk vel: 1.2 m/s
Area-P 1/2: 3.89 cm2
Calc EF: 58.3 %
MV M vel: 4.97 m/s
MV Peak grad: 98.8 mmHg
MV VTI: 1.83 cm2
Radius: 0.4 cm
S' Lateral: 2.6 cm
Single Plane A2C EF: 51.1 %
Single Plane A4C EF: 62.8 %

## 2022-06-17 LAB — RETICULOCYTES
Immature Retic Fract: 10.4 % (ref 2.3–15.9)
RBC.: 4.36 MIL/uL (ref 3.87–5.11)
Retic Count, Absolute: 75.4 10*3/uL (ref 19.0–186.0)
Retic Ct Pct: 1.7 % (ref 0.4–3.1)

## 2022-06-17 LAB — LACTATE DEHYDROGENASE: LDH: 199 U/L — ABNORMAL HIGH (ref 98–192)

## 2022-06-17 LAB — FERRITIN: Ferritin: 55 ng/mL (ref 11–307)

## 2022-06-17 LAB — SAVE SMEAR(SSMR), FOR PROVIDER SLIDE REVIEW

## 2022-06-17 NOTE — Progress Notes (Signed)
Hematology and Oncology Follow Up Visit  Tina Jenkins [id number] [birth year] 72 y.o. 06/17/2022   Principle Diagnosis:  Essential thrombocythemia-  JAK2+/CALR+  Current Therapy:   Hydrea 500 mg p.o. daily-start on 10/02/2021     Interim History:  Tina Jenkins is back for follow-up.  She is here with her husband today.  She remains on hydroxyurea 500 mg daily.  She is tolerating this well.  Denies abdominal pain, nausea, vomiting.  No skin changes, no rashes.  Denies chest pain and shortness of breath.  Bleeding reported.  Her last iron studies performed back in January 2024 showed a ferritin of 34% saturation of 25%.  Overall, I would say performance status is ECOG 1.   Medications:  Current Outpatient Medications:    aspirin EC 81 MG tablet, Take 81 mg by mouth daily., Disp: , Rfl:    atorvastatin (LIPITOR) 20 MG tablet, Take 20 mg by mouth at bedtime. , Disp: , Rfl:    Calcium Carbonate-Vit D-Min (CALCIUM 1200 PO), Take 1,200 mg by mouth 2 (two) times daily., Disp: , Rfl:    carvedilol (COREG) 6.25 MG tablet, Take 1 tablet (6.25 mg total) by mouth 2 (two) times daily., Disp: 180 tablet, Rfl: 3   dapagliflozin propanediol (FARXIGA) 10 MG TABS tablet, Take 1 tablet (10 mg total) by mouth daily before breakfast., Disp: 30 tablet, Rfl: 11   diphenhydramine-acetaminophen (TYLENOL PM) 25-500 MG TABS tablet, Take 1 tablet by mouth at bedtime as needed., Disp: , Rfl:    furosemide (LASIX) 40 MG tablet, Take 1 tablet (40 mg total) by mouth 2 (two) times a week. Monday and Friday, Disp: 30 tablet, Rfl: 3   glucosamine-chondroitin 500-400 MG tablet, Take 1 tablet by mouth 2 (two) times daily., Disp: , Rfl:    hydroxyurea (HYDREA) 500 MG capsule, TAKE 1 CAPSULE(500 MG) BY MOUTH DAILY. MAY TAKE WITH FOOD TO MINIMIZE GI SIDE EFFECTS, Disp: 30 capsule, Rfl: 6   Multiple Vitamin (MULTIVITAMIN WITH MINERALS) TABS, Take 1 tablet by mouth daily., Disp: , Rfl:    spironolactone (ALDACTONE) 25 MG tablet,  Take 1 tablet (25 mg total) by mouth daily., Disp: 30 tablet, Rfl: 11   Turmeric 500 MG CAPS, Take 1 capsule by mouth 2 (two) times daily., Disp: , Rfl:   Allergies: No Known Allergies  Past Medical History, Surgical history, Social history, and Family History were reviewed and updated.  Review of Systems: Review of Systems  Constitutional: Negative.   HENT:  Negative.    Eyes: Negative.   Respiratory: Negative.    Cardiovascular: Negative.   Gastrointestinal: Negative.   Endocrine: Negative.   Genitourinary: Negative.    Musculoskeletal: Negative.   Skin: Negative.   Neurological: Negative.   Hematological: Negative.   Psychiatric/Behavioral: Negative.      Physical Exam:  temperature is 98 F (36.7 C). Her blood pressure is 109/67 and her pulse is 78. Her respiration is 18 and oxygen saturation is 100%.   Wt Readings from Last 3 Encounters:  06/17/22 64.9 kg  04/18/22 67.6 kg  04/06/22 67.6 kg    Physical Exam Vitals reviewed.  HENT:     Head: Normocephalic and atraumatic.  Eyes:     Pupils: Pupils are equal, round, and reactive to light.  Cardiovascular:     Rate and Rhythm: Normal rate and regular rhythm.     Heart sounds: Normal heart sounds.  Pulmonary:     Effort: Pulmonary effort is normal.     Breath sounds: Normal  breath sounds.  Abdominal:     General: Bowel sounds are normal.     Palpations: Abdomen is soft.  Musculoskeletal:        General: No tenderness or deformity. Normal range of motion.     Cervical back: Normal range of motion.  Lymphadenopathy:     Cervical: No cervical adenopathy.  Skin:    General: Skin is warm and dry.     Findings: No erythema or rash.  Neurological:     Mental Status: She is alert and oriented to person, place, and time.  Psychiatric:        Behavior: Behavior normal.        Thought Content: Thought content normal.        Judgment: Judgment normal.      Lab Results  Component Value Date   WBC 3.3 (L)  06/17/2022   HGB 13.8 06/17/2022   HCT 44.0 06/17/2022   MCV 101.9 (H) 06/17/2022   PLT 415 (H) 06/17/2022     Chemistry      Component Value Date/Time   NA 141 06/17/2022 1448   K 4.4 06/17/2022 1448   CL 104 06/17/2022 1448   CO2 28 06/17/2022 1448   BUN 32 (H) 06/17/2022 1448   CREATININE 1.75 (H) 06/17/2022 1448      Component Value Date/Time   CALCIUM 11.0 (H) 06/17/2022 1448   ALKPHOS 62 06/17/2022 1448   AST 24 06/17/2022 1448   ALT 21 06/17/2022 1448   BILITOT 0.5 06/17/2022 1448      Impression and Plan: Tina Jenkins is a very charming 72 year old African-American female.  She has essential thrombocythemia.  This is proven by our molecular studies.  Her platelet count has improved.  Will continue on Hydrea 500 mg daily and aspirin 81 mg daily.  Iron studies are currently pending and we will follow-up on these.  Follow-up in approximately 3 months.  Mikey Bussing, NP 3/8/20243:14 PM

## 2022-06-17 NOTE — Patient Instructions (Signed)
There has been no changes to your medications.  Your physician recommends that you schedule a follow-up appointment in: 6 months ( September) ** please call the office in July to arrange your follow up appointment. **  If you have any questions or concerns before your next appointment please send [place] a message through [place] or call our office at [phone].    TO LEAVE A MESSAGE FOR THE NURSE SELECT OPTION 2, PLEASE LEAVE A MESSAGE INCLUDING: YOUR NAME DATE OF BIRTH CALL BACK NUMBER REASON FOR CALL**this is important as we prioritize the call backs  YOU WILL RECEIVE A CALL BACK THE SAME DAY AS LONG AS YOU CALL BEFORE 4:00 PM  At the [facility], you and your health needs are our priority. As part of our continuing mission to provide you with exceptional heart care, we have created designated Provider Care Teams. These Care Teams include your primary Cardiologist (physician) and Advanced Practice Providers (APPs- Physician Assistants and Nurse Practitioners) who all work together to provide you with the care you need, when you need it.   You may see any of the following providers on your designated Care Team at your next follow up: Dr Glori Bickers Dr Loralie Champagne Dr. Roxana Hires, NP Lyda Jester, [place] [facility] [employer], [place] Forestine Na, NP Audry Riles, PharmD   Please be sure to bring in all your medications bottles to every appointment.    Thank you for choosing [facility]

## 2022-06-17 NOTE — Progress Notes (Signed)
ADVANCED HF CLINIC NOTE  PCP: Kelton Pillar, MD Primary Cardiologist: Dr. Johney Frame  HF Cardiologist: Dr. Haroldine Laws Hematology:Dr Marin Olp    HPI: Tina Jenkins is a 72 y.o. AAF w/ h/o HTN controlled w/ medications, HLD, CKD IIIa, and chronic systolic heart failure/ NICM.    Has family h/o CHF and SCD. Her brother died at the age of 28 while running/playing. Her mother said he had a "bad heart". Her father also had CHF but older in life and lived until his 56s.    She had COVID in October 2022 but reports only mild symptoms.   Admitted 4/23 with new acute CHF. Had URI 2 weeks prior, COVID negative at that time. Diuresed w/ IV Lasix. Echo showed reduced LVEF, 35-40%, RV normal. Hs trop elevated not c/w acute myocarditis, 15>>19. Underwent R/LHC showing mild nonobstructive CAD, normal filling pressures and normal cardiac output.  GDMT initiated, referred to [facility], and discharged home with weight 146 lbs.    Seen in [facility] 4/23, feeling well. Stable NYHA I-II symptoms, euvolemic. Entresto started and Lasix changed to PRN.  cMRI (6/23) showed LVEF 36%, RVEF 44%, no LGE.  Echo 10/2021: EF 35-40% RV normal.   Today she returns for HF follow up with her husband.   Echo today EF 55-60% mild LVH G1DD. Uses elliptical 2-3x over 30 minutes. No CP or undue SOB. No edema. Complaint with meds. SBP 110-120s at home. Minimal snoring   Zio 12/23 1. Sinus rhythm - avg HR of 83 bpm.  2. One run of  nonsustained Ventricular Tachycardia occurred lasting 8 beats with a max rate of 176 bpm (avg 154 bpm). 3. Isolated PVCs were occasional (2.9%, 46030) 4. Ventricular Bigeminy and Trigeminy were present.   Cardiac Testing  - cMRI (6/23): LVEF 36% moderate HK, RVEF 44%, no LGE  - Echo 10/2021: EF 35-40% RV normal. - Echo (4/23): EF 35-40%, LV moderately decreased with global HK, moderate LVH, RV ok, mild to moderate MR  - R/LHC (4/23): CO/CI (Fick) 4.51/2.78 L/min PA: 30/9 (mean 17) mmHg RA A: 3 mmHg RA  V:1 mmHg RA mean (0) RVSP: 26 mmgHg LVEDP: 3 mmHg  25% distal left main. 25% ostial LAD. 25% ostial circumflex. Normal dominant right coronary. Normal right heart pressures without pulmonary hypertension.  LVEDP 3 mmHg and mean wedge pressure 4 mmHg. Global left ventricular hypokinesis consistent with systolic heart failure, compensated. Etiology uncertain but illness was preceded by an upper respiratory infection and therefore could be postviral myocarditis. Father died of heart failure and therefore genetic predisposition is a consideration as well. Quadruple therapy with hopeful full recovery of LV function.   ROS: All systems reviewed and negative except as per HPI.   Past Medical History:  Diagnosis Date   Chronic kidney disease    kidney stones   Essential thrombocythemia ([place]) 10/01/2021   Hypercholesteremia    Hypertension    Nonischemic cardiomyopathy (HCC)    Systolic heart failure (HCC)    Current Outpatient Medications  Medication Sig Dispense Refill   aspirin EC 81 MG tablet Take 81 mg by mouth daily.     atorvastatin (LIPITOR) 20 MG tablet Take 20 mg by mouth at bedtime.      Calcium Carbonate-Vit D-Min (CALCIUM 1200 PO) Take 1,200 mg by mouth 2 (two) times daily.     carvedilol (COREG) 6.25 MG tablet Take 1 tablet (6.25 mg total) by mouth 2 (two) times daily. 180 tablet 3   dapagliflozin propanediol (FARXIGA) 10 MG TABS tablet  Take 1 tablet (10 mg total) by mouth daily before breakfast. 30 tablet 11   diphenhydramine-acetaminophen (TYLENOL PM) 25-500 MG TABS tablet Take 1 tablet by mouth at bedtime as needed.     furosemide (LASIX) 40 MG tablet Take 1 tablet (40 mg total) by mouth 2 (two) times a week. Monday and Friday 30 tablet 3   glucosamine-chondroitin 500-400 MG tablet Take 1 tablet by mouth 2 (two) times daily.     hydroxyurea (HYDREA) 500 MG capsule TAKE 1 CAPSULE(500 MG) BY MOUTH DAILY. MAY TAKE WITH FOOD TO MINIMIZE GI SIDE EFFECTS 30 capsule 6   Multiple  Vitamin (MULTIVITAMIN WITH MINERALS) TABS Take 1 tablet by mouth daily.     spironolactone (ALDACTONE) 25 MG tablet Take 1 tablet (25 mg total) by mouth daily. 30 tablet 11   Turmeric 500 MG CAPS Take 1 capsule by mouth 2 (two) times daily.     No current facility-administered medications for this encounter.   No Known Allergies  Social History   Socioeconomic History   Marital status: Married    Spouse name: Chief Strategy Officer   Number of children: 1   Years of education: Not on file   Highest education level: High school graduate  Occupational History   Occupation: Retired    Comment: [employer]  Tobacco Use   Smoking status: Never   Smokeless tobacco: Not on file  Vaping Use   Vaping Use: Never used  Substance and Sexual Activity   Alcohol use: No   Drug use: No   Sexual activity: Not on file  Other Topics Concern   Not on file  Social History Narrative   Not on file   Social Determinants of Health   Financial Resource Strain: Low Risk  (07/26/2021)   Overall Financial Resource Strain (CARDIA)    Difficulty of Paying Living Expenses: Not hard at all  Food Insecurity: No Food Insecurity (07/26/2021)   Hunger Vital Sign    Worried About Running Out of Food in the Last Year: Never true    Ran Out of Food in the Last Year: Never true  Transportation Needs: No Transportation Needs (07/26/2021)   PRAPARE - Hydrologist (Medical): No    Lack of Transportation (Non-Medical): No  Physical Activity: Not on file  Stress: Not on file  Social Connections: Not on file  Intimate Partner Violence: Not on file   Family History  Problem Relation Age of Onset   Heart failure Father    Sudden Cardiac Death Brother 12   BP 100/60   Pulse 75   Wt 64.9 kg (143 lb)   SpO2 97%   BMI 28.88 kg/m   Wt Readings from Last 3 Encounters:  06/17/22 64.9 kg (143 lb)  04/18/22 67.6 kg (149 lb)  04/06/22 67.6 kg (149 lb)   PHYSICAL EXAM: General:  Well appearing. No resp  difficulty HEENT: normal Neck: supple. no JVD. Carotids 2+ bilat; no bruits. No lymphadenopathy or thryomegaly appreciated. Cor: PMI nondisplaced. Regular rate & rhythm. No rubs, gallops or murmurs. Lungs: clear Abdomen: soft, nontender, nondistended. No hepatosplenomegaly. No bruits or masses. Good bowel sounds. Extremities: no cyanosis, clubbing, rash, edema Neuro: alert & orientedx3, cranial nerves grossly intact. moves all 4 extremities w/o difficulty. Affect pleasant  ASSESSMENT & PLAN: Chronic Systolic Heart Failure - NICM. - Echo (4/23): EF 35-40%, RV normal - L/RHC (4/23): w/ mild obstructive CAD, normal filling pressures and normal CO  - Etiology uncertain, but acute CHF exacerbation  was preceded by an upper respiratory infection and therefore could be postviral myocarditis. Doubt acute given Hs trop trend but did have COVID 10/22.  - Also ? Familial CM given family history. Father died from HF.  - cMRI 2022/10/25) LVEF 36%, RVEF 44%, no LGE. - Echo 7/23  EF 35-40% RV normal . Out of range for ICD - Echo 06/17/22 EF 55-60% mild LVH G1DD - NYHA I. Volume status stable. Currently on lasix M and F.  - Continue Coreg 6.25 mg bid. - Off entresto with low BP.  Will not re-challenge for now.  - Continue Farxiga 10 mg daily.  - Continue spironolactone 25 mg daily.  - Zio 12/23 2.7% PVCs - 6 month f/u with NP/PA then 9-12 months with me for f/u echo to ensure stability  2. Hypertension  -  Stable. Continue current regimen.   3. Stage IIIb-IV CKD  - Continue Farxiga 10 mg daily.  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 Follows with Heme/Onc   5. PVCs - Zio 12/23 2.7% PVCs - Denies significant snoring apnea   Glori Bickers, MD 9:26 AM

## 2022-06-20 LAB — IRON AND IRON BINDING CAPACITY (CC-WL,HP ONLY)
Iron: 57 ug/dL (ref 28–170)
Saturation Ratios: 16 % (ref 10.4–31.8)
TIBC: 351 ug/dL (ref 250–450)
UIBC: 294 ug/dL (ref 148–442)

## 2022-06-21 ENCOUNTER — Other Ambulatory Visit (HOSPITAL_BASED_OUTPATIENT_CLINIC_OR_DEPARTMENT_OTHER): Payer: Self-pay

## 2022-06-23 ENCOUNTER — Telehealth (HOSPITAL_COMMUNITY): Payer: Self-pay

## 2022-06-23 NOTE — Telephone Encounter (Signed)
Need to renew her [employer] for [place]. She called to let us know.  Can you handle this for her?

## 2022-06-30 ENCOUNTER — Telehealth (HOSPITAL_COMMUNITY): Payer: Self-pay | Admitting: Pharmacy Technician

## 2022-06-30 ENCOUNTER — Other Ambulatory Visit (HOSPITAL_COMMUNITY): Payer: Self-pay

## 2022-06-30 NOTE — Telephone Encounter (Signed)
Advanced Heart Failure Patient Advocate Encounter  The patient was approved for a [employer] grant that will help cover the cost of Farxiga, Coreg, Spironolactone. Total amount awarded, $10,000. Eligibility, 07/26/22 - 07/25/23.  ID [id number]  BIN [id number]  PCN [id number]  Group [id number]  Called and spoke with the patient. Entered billing information into [place], CPhT

## 2022-07-22 ENCOUNTER — Ambulatory Visit: Payer: Medicare Other | Admitting: Podiatry

## 2022-07-22 DIAGNOSIS — M21619 Bunion of unspecified foot: Secondary | ICD-10-CM | POA: Diagnosis not present

## 2022-07-22 DIAGNOSIS — L84 Corns and callosities: Secondary | ICD-10-CM | POA: Diagnosis not present

## 2022-07-22 DIAGNOSIS — M2022 Hallux rigidus, left foot: Secondary | ICD-10-CM | POA: Diagnosis not present

## 2022-07-22 NOTE — Progress Notes (Signed)
  Subjective:  Patient ID: Tina Jenkins, female    DOB: 1951/03/27,  MRN: [id number]  Chief Complaint  Patient presents with   Foot Problem    Possible cyst on the left foot, 3rd digit on the lateral side, painful when walking, no prior treatment      72 y.o. female presents with concern for painful lesion on the second toe lateral border of the left foot.  She says that she also has a bunion deformity and some joint arthritis in the joint.  Past Medical History:  Diagnosis Date   Chronic kidney disease    kidney stones   Essential thrombocythemia (HCC) 10/01/2021   Hypercholesteremia    Hypertension    Nonischemic cardiomyopathy (HCC)    Systolic heart failure (HCC)     No Known Allergies  ROS: Negative except as per HPI above  Objective:  General: AAO x3, NAD  Dermatological: At the lateral aspect of the left second toe there is noted to be a small hyperkeratotic lesion due to pressure of the second toe pressing into the third toe.  No underlying ulceration.    Vascular:  Dorsalis Pedis artery and Posterior Tibial artery pedal pulses are 2/4 bilateral.  Capillary fill time < 3 sec to all digits.   Neruologic: Grossly intact via light touch bilateral. Protective threshold intact to all sites bilateral.   MusculoskeletalHallux valgus deformity with prominent medial eminence as well as severe.  Arthritic changes at the first MPJ with decreased first ray range of motion  Gait: Unassisted, Nonantalgic.   No images are attached to the encounter.   Assessment:   1. Bunion   2. Hallux rigidus of left foot   3. Callus      Plan:  Patient was evaluated and treated and all questions answered.  # Hallux rigidus of the left foot first MPJ -Discussed with the patient that she does have evidence of significant arthritic changes in the first MPJ with decreased range of motion. -Conservatively can continue with gel For the bunion area when she is in stiffer shoes.  She says she  has been doing this and it is helping -Surgically the plan would be for fusion of the first MPJ.  # Callus of second toe on the left foot -Debrided the callus down with 15 blade without issue -Patient has decreased pain -Recommend gel toe cap prevent recurrence  Return if symptoms worsen or fail to improve.          Corinna Gab, DPM [facility]

## 2022-08-03 IMAGING — US US ABDOMEN COMPLETE
1 series · 14 of 25 positions shown · non-contrast
Comparison: Renal ultrasound 05/04/2021

CLINICAL DATA: Thrombocytosis

EXAM:
ABDOMEN ULTRASOUND COMPLETE

[Series 1: us abdomen complete · 14 of 91 slices shown]
[im 1/91]
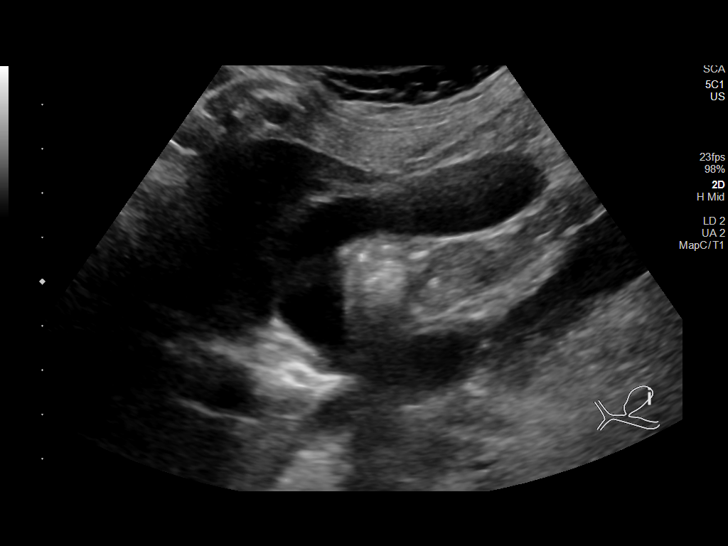
[im 8/91]
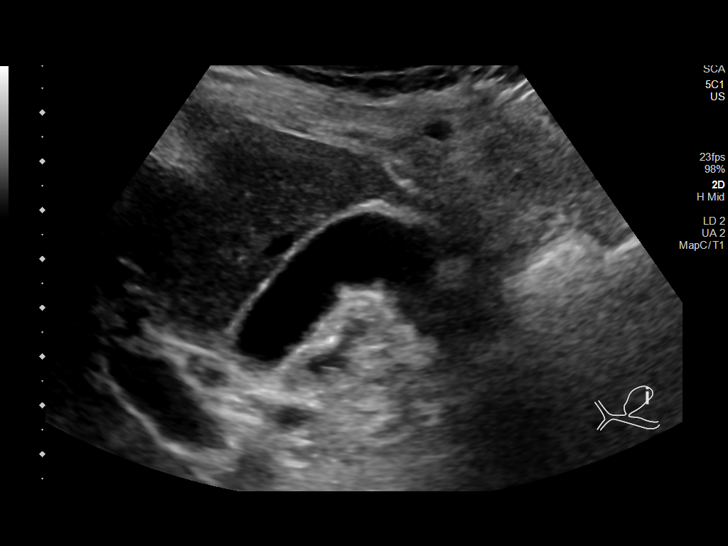
[im 16/91]
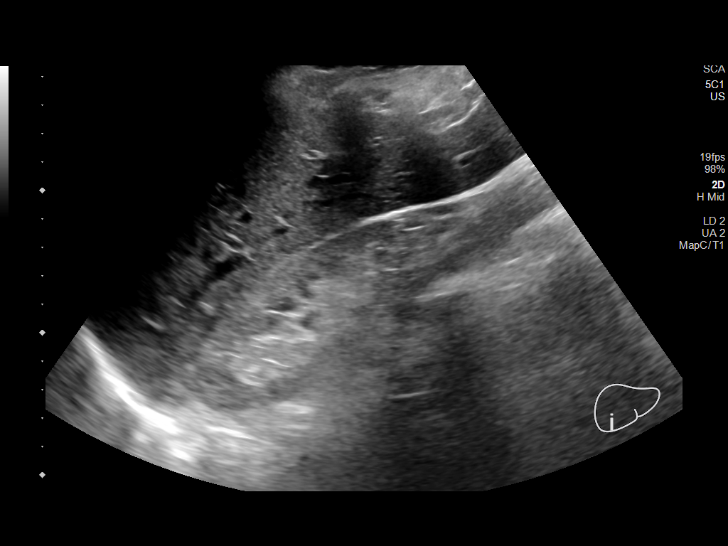
[im 23/91]
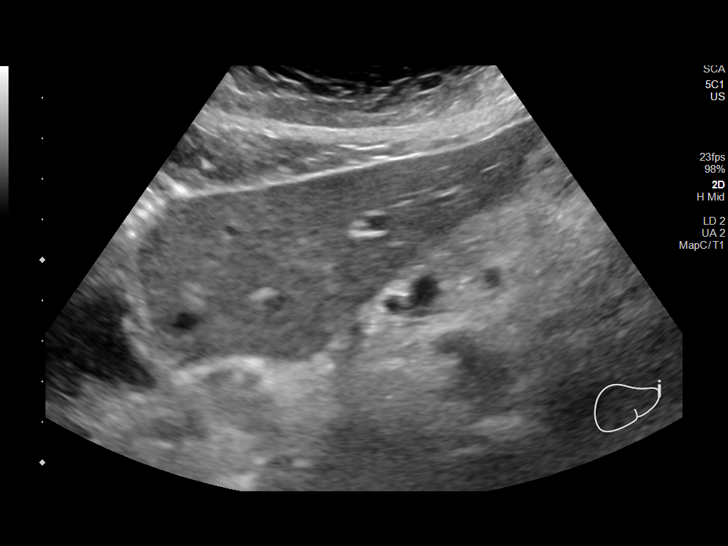
[im 31/91]
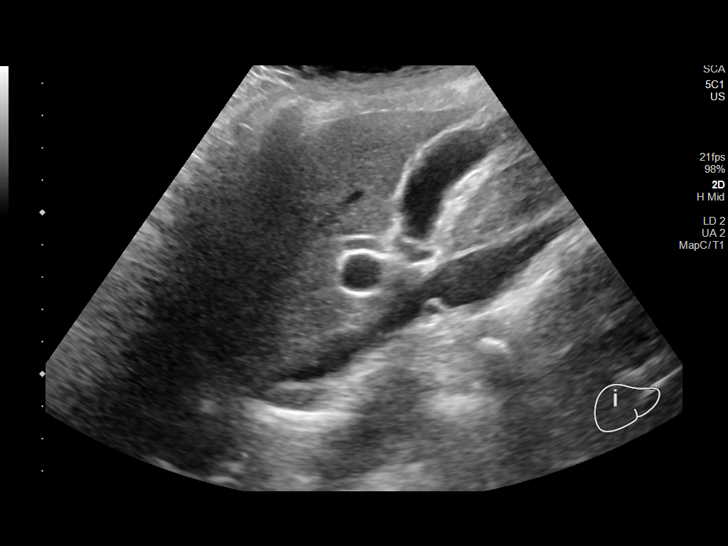
[im 34/91]
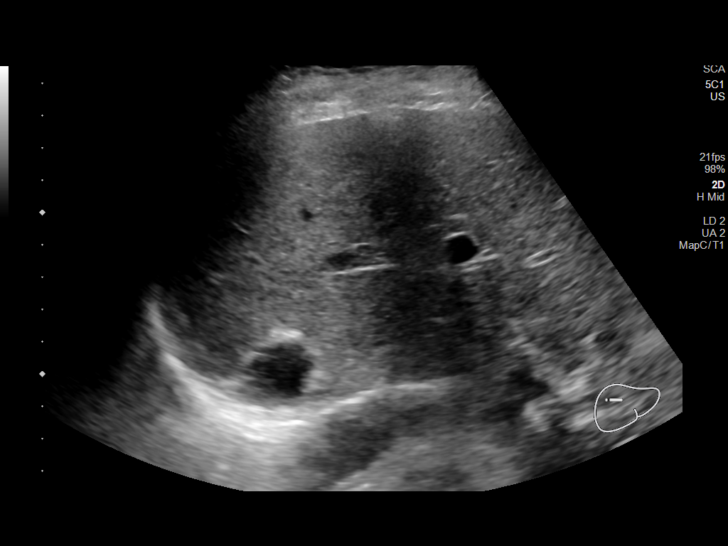
[im 42/91]
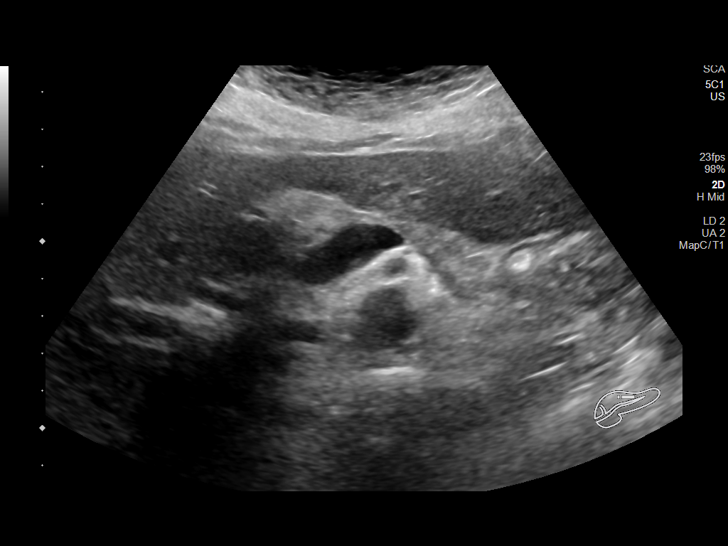
[im 49/91]
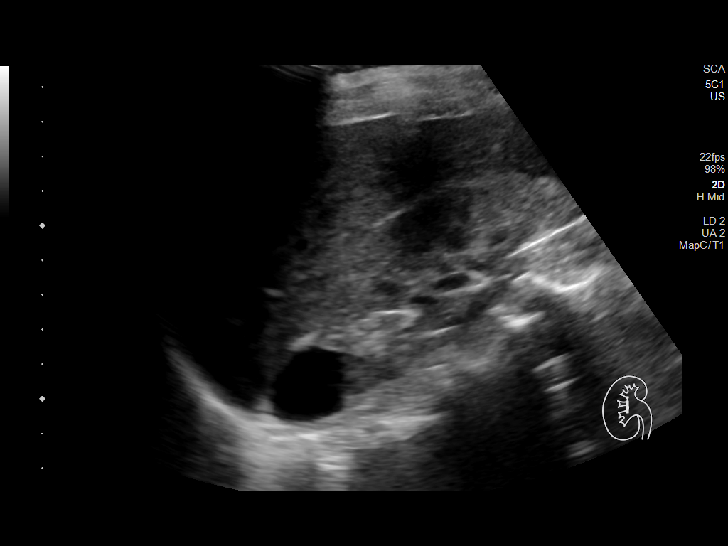
[im 57/91]
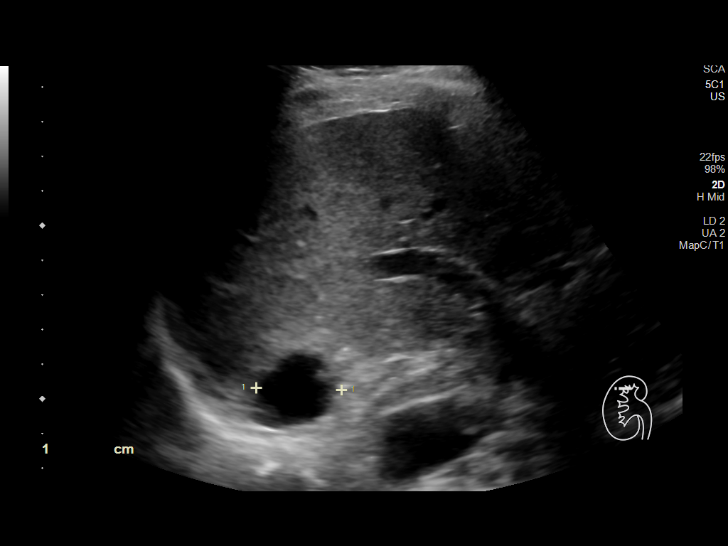
[im 61/91]
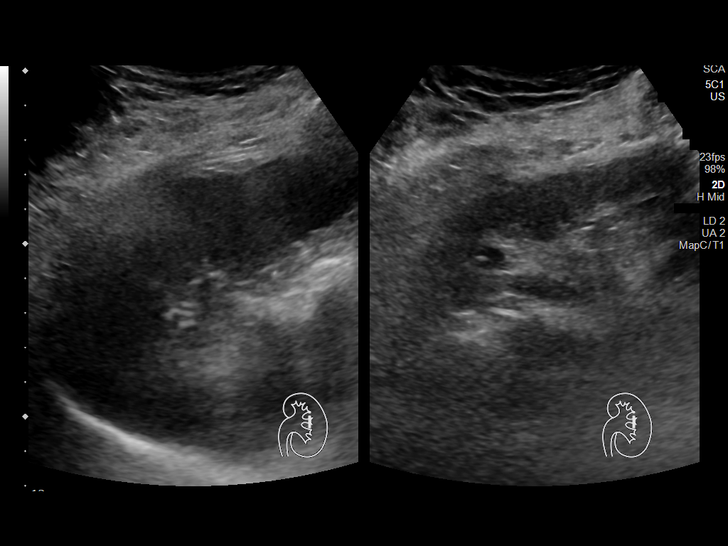
[im 68/91]
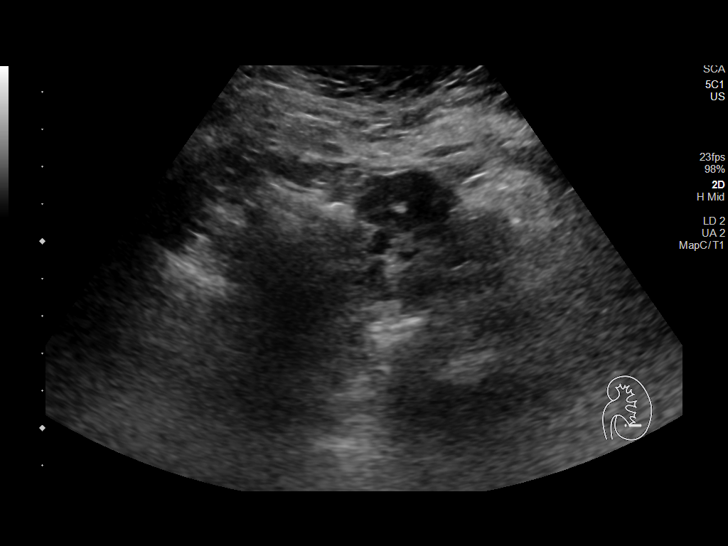
[im 76/91]
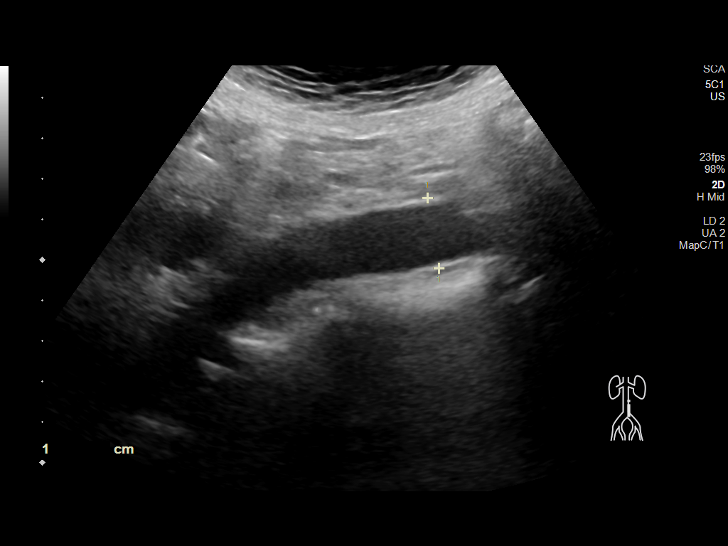
[im 83/91]
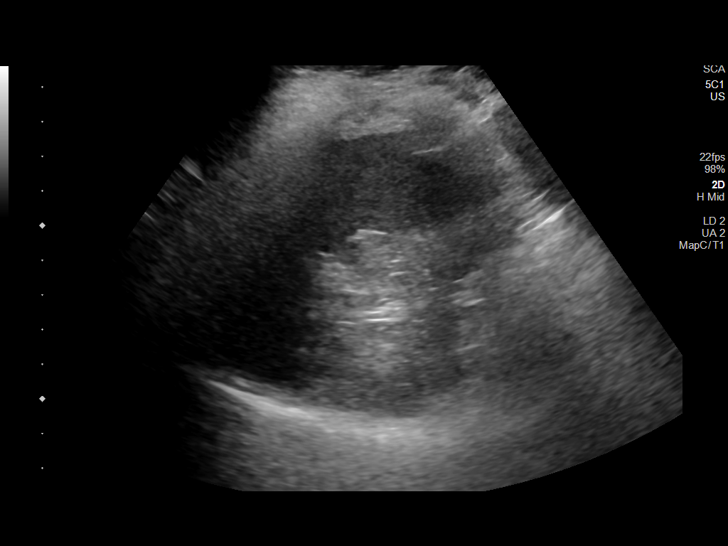
[im 91/91]
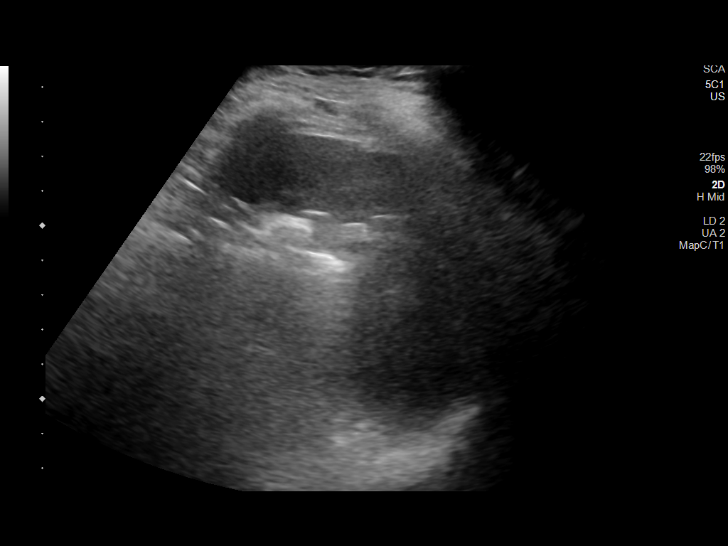

[14 of 25 positions shown; findings below may reference images not displayed]

FINDINGS: Gallbladder: No gallstones or wall thickening visualized. No
sonographic Murphy sign noted by sonographer.

Common bile duct: Diameter: 3 mm

Liver: No focal lesion identified. Within normal limits in
parenchymal echogenicity. Portal vein is patent on color Doppler
imaging with normal direction of blood flow towards the liver.

IVC: No abnormality visualized.

Pancreas: Visualized portion unremarkable.

Spleen: Size and appearance within normal limits.

Right Kidney: Length: 10.3 cm. Increased echogenicity. 2.6 cm
anechoic cyst in the upper pole. No hydronephrosis.

Left Kidney: Length: 8.7 cm. Increased echogenicity. 1 cm calculus
in the midpole. No hydronephrosis.

Abdominal aorta: No aneurysm visualized.

Other findings: None.
IMPRESSION: 1. Increased echogenicity of the kidneys suggesting medical renal
disease.
2. Left nephrolithiasis.  Right renal cyst.

## 2022-08-16 ENCOUNTER — Other Ambulatory Visit (HOSPITAL_BASED_OUTPATIENT_CLINIC_OR_DEPARTMENT_OTHER): Payer: Self-pay

## 2022-08-23 ENCOUNTER — Other Ambulatory Visit (HOSPITAL_BASED_OUTPATIENT_CLINIC_OR_DEPARTMENT_OTHER): Payer: Self-pay

## 2022-09-09 ENCOUNTER — Other Ambulatory Visit: Payer: Self-pay

## 2022-09-09 ENCOUNTER — Encounter: Payer: Self-pay | Admitting: Family

## 2022-09-09 ENCOUNTER — Inpatient Hospital Stay (HOSPITAL_BASED_OUTPATIENT_CLINIC_OR_DEPARTMENT_OTHER): Payer: Medicare Other | Admitting: Family

## 2022-09-09 ENCOUNTER — Inpatient Hospital Stay: Payer: Medicare Other | Attending: Hematology & Oncology

## 2022-09-09 ENCOUNTER — Other Ambulatory Visit: Payer: Self-pay | Admitting: *Deleted

## 2022-09-09 VITALS — BP 137/77 | HR 65 | Temp 98.0°F | Resp 18 | Ht <= 58 in | Wt 143.1 lb

## 2022-09-09 DIAGNOSIS — D473 Essential (hemorrhagic) thrombocythemia: Secondary | ICD-10-CM | POA: Insufficient documentation

## 2022-09-09 DIAGNOSIS — D75839 Thrombocytosis, unspecified: Secondary | ICD-10-CM

## 2022-09-09 LAB — LACTATE DEHYDROGENASE: LDH: 213 U/L — ABNORMAL HIGH (ref 98–192)

## 2022-09-09 LAB — CBC WITH DIFFERENTIAL (CANCER CENTER ONLY)
Abs Immature Granulocytes: 0.02 10*3/uL (ref 0.00–0.07)
Basophils Absolute: 0 10*3/uL (ref 0.0–0.1)
Basophils Relative: 1 %
Eosinophils Absolute: 0.1 10*3/uL (ref 0.0–0.5)
Eosinophils Relative: 5 %
HCT: 44 % (ref 36.0–46.0)
Hemoglobin: 13.7 g/dL (ref 12.0–15.0)
Immature Granulocytes: 1 %
Lymphocytes Relative: 33 %
Lymphs Abs: 0.9 10*3/uL (ref 0.7–4.0)
MCH: 31.8 pg (ref 26.0–34.0)
MCHC: 31.1 g/dL (ref 30.0–36.0)
MCV: 102.1 fL — ABNORMAL HIGH (ref 80.0–100.0)
Monocytes Absolute: 0.4 10*3/uL (ref 0.1–1.0)
Monocytes Relative: 16 %
Neutro Abs: 1.2 10*3/uL — ABNORMAL LOW (ref 1.7–7.7)
Neutrophils Relative %: 44 %
Platelet Count: 471 10*3/uL — ABNORMAL HIGH (ref 150–400)
RBC: 4.31 MIL/uL (ref 3.87–5.11)
RDW: 14.6 % (ref 11.5–15.5)
WBC Count: 2.6 10*3/uL — ABNORMAL LOW (ref 4.0–10.5)
nRBC: 0 % (ref 0.0–0.2)

## 2022-09-09 LAB — CMP (CANCER CENTER ONLY)
ALT: 20 U/L (ref 0–44)
AST: 28 U/L (ref 15–41)
Albumin: 4.5 g/dL (ref 3.5–5.0)
Alkaline Phosphatase: 68 U/L (ref 38–126)
Anion gap: 9 (ref 5–15)
BUN: 35 mg/dL — ABNORMAL HIGH (ref 8–23)
CO2: 31 mmol/L (ref 22–32)
Calcium: 10.4 mg/dL — ABNORMAL HIGH (ref 8.9–10.3)
Chloride: 102 mmol/L (ref 98–111)
Creatinine: 1.62 mg/dL — ABNORMAL HIGH (ref 0.44–1.00)
GFR, Estimated: 33 mL/min — ABNORMAL LOW (ref >60)
Glucose, Bld: 104 mg/dL — ABNORMAL HIGH (ref 70–99)
Potassium: 4.6 mmol/L (ref 3.5–5.1)
Sodium: 142 mmol/L (ref 135–145)
Total Bilirubin: 0.7 mg/dL (ref 0.3–1.2)
Total Protein: 7.2 g/dL (ref 6.5–8.1)

## 2022-09-09 LAB — RETICULOCYTES
Immature Retic Fract: 17.5 % — ABNORMAL HIGH (ref 2.3–15.9)
RBC.: 4.3 MIL/uL (ref 3.87–5.11)
Retic Count, Absolute: 68.8 10*3/uL (ref 19.0–186.0)
Retic Ct Pct: 1.6 % (ref 0.4–3.1)

## 2022-09-09 LAB — IRON AND IRON BINDING CAPACITY (CC-WL,HP ONLY)
Iron: 104 ug/dL (ref 28–170)
Saturation Ratios: 27 % (ref 10.4–31.8)
TIBC: 379 ug/dL (ref 250–450)
UIBC: 275 ug/dL (ref 148–442)

## 2022-09-09 LAB — FERRITIN: Ferritin: 52 ng/mL (ref 11–307)

## 2022-09-09 LAB — SAVE SMEAR(SSMR), FOR PROVIDER SLIDE REVIEW

## 2022-09-09 NOTE — Progress Notes (Signed)
Hematology and Oncology Follow Up Visit  Tina Jenkins [id number] [birth year] 72 y.o. 09/09/2022   Principle Diagnosis:  Essential thrombocythemia-  JAK2+/CALR+   Current Therapy:        Hydrea 500 mg p.o. daily-start on 10/02/2021   Interim History:  Tina Jenkins is here today with her husband for follow-up. She is doing well and has no complaints at this time.  She had a bout with diverticulitis treated with antibiotic in April. She is asymptomatic at this time.  No other issues with infection. No fever, chills, n/v, cough, rash, dizziness, SOB, chest pain, palpitations, abdominal pain or changes in bowel or bladder habits.  No swelling or tenderness in her extremities.  Positional tingling in her hands with driving and while on her elliptical.  No falls or syncope reported.  Appetite and hydration are good. Weight is stable at 143 lbs.   ECOG Performance Status: 1 - Symptomatic but completely ambulatory  Medications:  Allergies as of 09/09/2022   No Known Allergies      Medication List        Accurate as of Sep 09, 2022  9:31 AM. If you have any questions, ask your nurse or doctor.          STOP taking these medications    glucosamine-chondroitin 500-400 MG tablet Stopped by: Eileen Stanford, NP       TAKE these medications    aspirin EC 81 MG tablet Take 81 mg by mouth daily.   atorvastatin 20 MG tablet Commonly known as: LIPITOR Take 20 mg by mouth at bedtime.   CALCIUM 1200 PO Take 1,200 mg by mouth 2 (two) times daily.   carvedilol 6.25 MG tablet Commonly known as: COREG Take 1 tablet (6.25 mg total) by mouth 2 (two) times daily.   diphenhydramine-acetaminophen 25-500 MG Tabs tablet Commonly known as: TYLENOL PM Take 1 tablet by mouth at bedtime as needed.   Farxiga 10 MG Tabs tablet Generic drug: dapagliflozin propanediol Take 1 tablet (10 mg total) by mouth daily before breakfast.   furosemide 40 MG tablet Commonly known as: LASIX Take 1  tablet (40 mg total) by mouth 2 (two) times a week. Monday and Friday   hydroxyurea 500 MG capsule Commonly known as: HYDREA TAKE 1 CAPSULE(500 MG) BY MOUTH DAILY. MAY TAKE WITH FOOD TO MINIMIZE GI SIDE EFFECTS   Magnesium 200 MG Tabs Take 1 tablet by mouth 2 (two) times daily.   multivitamin with minerals Tabs tablet Take 1 tablet by mouth daily.   spironolactone 25 MG tablet Commonly known as: ALDACTONE Take 1 tablet (25 mg total) by mouth daily.   Turmeric 500 MG Caps Take 1 capsule by mouth 2 (two) times daily.        Allergies: No Known Allergies  Past Medical History, Surgical history, Social history, and Family History were reviewed and updated.  Review of Systems: All other 10 point review of systems is negative.   Physical Exam:  height is 4\' 9"  (1.448 m) and weight is 143 lb 1.9 oz (64.9 kg). Her oral temperature is 98 F (36.7 C). Her blood pressure is 137/77 and her pulse is 65. Her respiration is 18 and oxygen saturation is 100%.   Wt Readings from Last 3 Encounters:  09/09/22 143 lb 1.9 oz (64.9 kg)  06/17/22 143 lb (64.9 kg)  04/18/22 149 lb (67.6 kg)    Ocular: Sclerae unicteric, pupils equal, round and reactive to light Ear-nose-throat: Oropharynx clear, dentition fair Lymphatic: No  cervical or supraclavicular adenopathy Lungs no rales or rhonchi, good excursion bilaterally Heart regular rate and rhythm, no murmur appreciated Abd soft, nontender, positive bowel sounds MSK no focal spinal tenderness, no joint edema Neuro: non-focal, well-oriented, appropriate affect Breasts: Deferred   Lab Results  Component Value Date   WBC 2.6 (L) 09/09/2022   HGB 13.7 09/09/2022   HCT 44.0 09/09/2022   MCV 102.1 (H) 09/09/2022   PLT 471 (H) 09/09/2022   Lab Results  Component Value Date   FERRITIN 55 06/17/2022   IRON 57 06/17/2022   TIBC 351 06/17/2022   UIBC 294 06/17/2022   IRONPCTSAT 16 06/17/2022   Lab Results  Component Value Date    RETICCTPCT 1.6 09/09/2022   RBC 4.30 09/09/2022   No results found for: "KPAFRELGTCHN", "LAMBDASER", "KAPLAMBRATIO" No results found for: "IGGSERUM", "IGA", "IGMSERUM" No results found for: "TOTALPROTELP", "ALBUMINELP", "A1GS", "A2GS", "BETS", "BETA2SER", "GAMS", "MSPIKE", "SPEI"   Chemistry      Component Value Date/Time   NA 141 06/17/2022 1448   K 4.4 06/17/2022 1448   CL 104 06/17/2022 1448   CO2 28 06/17/2022 1448   BUN 32 (H) 06/17/2022 1448   CREATININE 1.75 (H) 06/17/2022 1448      Component Value Date/Time   CALCIUM 11.0 (H) 06/17/2022 1448   ALKPHOS 62 06/17/2022 1448   AST 24 06/17/2022 1448   ALT 21 06/17/2022 1448   BILITOT 0.5 06/17/2022 1448       Impression and Plan: Tina Jenkins is a very pleasant 72 yo African American female with essential thrombocythemia, JAK 2 + and CALR +.  I spoke with Dr. Myna Hidalgo regarding her lower WBC count 2.6 and ANC 1.2.  We will have her reduce her Hydrea to 500 mg PO every other day.  Follow-up in 6 weeks.   Eileen Stanford, NP 5/31/20249:31 AM

## 2022-09-16 ENCOUNTER — Other Ambulatory Visit (HOSPITAL_BASED_OUTPATIENT_CLINIC_OR_DEPARTMENT_OTHER): Payer: Self-pay

## 2022-09-16 ENCOUNTER — Other Ambulatory Visit (HOSPITAL_COMMUNITY): Payer: Self-pay | Admitting: Family Medicine

## 2022-09-16 MED ORDER — DAPAGLIFLOZIN PROPANEDIOL 10 MG PO TABS
10.0000 mg | ORAL_TABLET | Freq: Every day | ORAL | 11 refills | Status: DC
  Filled 2022-09-16: qty 30, 30d supply, fill #0
  Filled 2022-10-17: qty 30, 30d supply, fill #1
  Filled 2022-11-15: qty 30, 30d supply, fill #2
  Filled 2022-12-12: qty 30, 30d supply, fill #3
  Filled 2023-01-10: qty 30, 30d supply, fill #4
  Filled 2023-02-09: qty 30, 30d supply, fill #5
  Filled 2023-03-08: qty 30, 30d supply, fill #6
  Filled 2023-04-07: qty 30, 30d supply, fill #7
  Filled 2023-05-08: qty 30, 30d supply, fill #8
  Filled 2023-06-06: qty 30, 30d supply, fill #9
  Filled 2023-07-06: qty 30, 30d supply, fill #10
  Filled 2023-08-01: qty 30, 30d supply, fill #11

## 2022-10-10 ENCOUNTER — Other Ambulatory Visit (HOSPITAL_COMMUNITY): Payer: Self-pay | Admitting: Cardiology

## 2022-10-14 ENCOUNTER — Other Ambulatory Visit (HOSPITAL_COMMUNITY): Payer: Self-pay | Admitting: Cardiology

## 2022-10-14 MED ORDER — SPIRONOLACTONE 25 MG PO TABS: 25.0000 mg | ORAL_TABLET | Freq: Every day | ORAL | 3 refills | Status: DC

## 2022-10-17 ENCOUNTER — Other Ambulatory Visit (HOSPITAL_BASED_OUTPATIENT_CLINIC_OR_DEPARTMENT_OTHER): Payer: Self-pay

## 2022-10-18 ENCOUNTER — Other Ambulatory Visit (HOSPITAL_BASED_OUTPATIENT_CLINIC_OR_DEPARTMENT_OTHER): Payer: Self-pay

## 2022-10-21 ENCOUNTER — Inpatient Hospital Stay: Payer: Medicare Other | Admitting: Hematology & Oncology

## 2022-10-21 ENCOUNTER — Encounter: Payer: Self-pay | Admitting: Hematology & Oncology

## 2022-10-21 ENCOUNTER — Inpatient Hospital Stay: Payer: Medicare Other | Attending: Hematology & Oncology

## 2022-10-21 VITALS — BP 147/78 | HR 66 | Temp 98.3°F | Resp 20 | Ht 59.0 in | Wt 147.0 lb

## 2022-10-21 DIAGNOSIS — D473 Essential (hemorrhagic) thrombocythemia: Secondary | ICD-10-CM | POA: Insufficient documentation

## 2022-10-21 LAB — CMP (CANCER CENTER ONLY)
ALT: 30 U/L (ref 0–44)
AST: 35 U/L (ref 15–41)
Albumin: 4.1 g/dL (ref 3.5–5.0)
Alkaline Phosphatase: 59 U/L (ref 38–126)
Anion gap: 7 (ref 5–15)
BUN: 24 mg/dL — ABNORMAL HIGH (ref 8–23)
CO2: 27 mmol/L (ref 22–32)
Calcium: 10 mg/dL (ref 8.9–10.3)
Chloride: 108 mmol/L (ref 98–111)
Creatinine: 1.36 mg/dL — ABNORMAL HIGH (ref 0.44–1.00)
GFR, Estimated: 41 mL/min — ABNORMAL LOW (ref >60)
Glucose, Bld: 98 mg/dL (ref 70–99)
Potassium: 4.1 mmol/L (ref 3.5–5.1)
Sodium: 142 mmol/L (ref 135–145)
Total Bilirubin: 0.5 mg/dL (ref 0.3–1.2)
Total Protein: 6.6 g/dL (ref 6.5–8.1)

## 2022-10-21 LAB — CBC WITH DIFFERENTIAL (CANCER CENTER ONLY)
Abs Immature Granulocytes: 0.03 10*3/uL (ref 0.00–0.07)
Basophils Absolute: 0 10*3/uL (ref 0.0–0.1)
Basophils Relative: 1 %
Eosinophils Absolute: 0.2 10*3/uL (ref 0.0–0.5)
Eosinophils Relative: 7 %
HCT: 42.5 % (ref 36.0–46.0)
Hemoglobin: 13.2 g/dL (ref 12.0–15.0)
Immature Granulocytes: 1 %
Lymphocytes Relative: 27 %
Lymphs Abs: 0.8 10*3/uL (ref 0.7–4.0)
MCH: 31.4 pg (ref 26.0–34.0)
MCHC: 31.1 g/dL (ref 30.0–36.0)
MCV: 101.2 fL — ABNORMAL HIGH (ref 80.0–100.0)
Monocytes Absolute: 0.5 10*3/uL (ref 0.1–1.0)
Monocytes Relative: 16 %
Neutro Abs: 1.4 10*3/uL — ABNORMAL LOW (ref 1.7–7.7)
Neutrophils Relative %: 48 %
Platelet Count: 456 10*3/uL — ABNORMAL HIGH (ref 150–400)
RBC: 4.2 MIL/uL (ref 3.87–5.11)
RDW: 13 % (ref 11.5–15.5)
WBC Count: 2.9 10*3/uL — ABNORMAL LOW (ref 4.0–10.5)
nRBC: 0 % (ref 0.0–0.2)

## 2022-10-21 LAB — RETICULOCYTES
Immature Retic Fract: 15.1 % (ref 2.3–15.9)
RBC.: 4.17 MIL/uL (ref 3.87–5.11)
Retic Count, Absolute: 70.1 10*3/uL (ref 19.0–186.0)
Retic Ct Pct: 1.7 % (ref 0.4–3.1)

## 2022-10-21 LAB — IRON AND IRON BINDING CAPACITY (CC-WL,HP ONLY)
Iron: 58 ug/dL (ref 28–170)
Saturation Ratios: 16 % (ref 10.4–31.8)
TIBC: 354 ug/dL (ref 250–450)
UIBC: 296 ug/dL (ref 148–442)

## 2022-10-21 LAB — SAVE SMEAR(SSMR), FOR PROVIDER SLIDE REVIEW

## 2022-10-21 LAB — LACTATE DEHYDROGENASE: LDH: 228 U/L — ABNORMAL HIGH (ref 98–192)

## 2022-10-21 LAB — FERRITIN: Ferritin: 29 ng/mL (ref 11–307)

## 2022-10-21 NOTE — Progress Notes (Signed)
Hematology and Oncology Follow Up Visit  Tina Jenkins [id number] 1950-12-05 72 y.o. 10/21/2022   Principle Diagnosis:  Essential thrombocythemia-  JAK2+/CALR+   Current Therapy:        Hydrea 500 mg p.o. daily-start on 10/02/2021  - taking every other day now  Interim History:  Tina Jenkins is here today with her husband for follow-up.  So far, she is a very nice summer.  She has had no problems with the heat.  She has a very nice garden that she is tending.  Apparently, it is very productive.  She is doing well on the Hydrea.  She takes this every Monday-Wednesday-Friday..  She has had no nausea or vomiting.  She has had no leg pain or ulcers.  She has had no cough or shortness of breath.  She has had no change in bowel or bladder habits.  There is been no diarrhea.  She has had no issues with bleeding.  She has had no rashes.  Her last iron level back in May showed a ferritin of 52 with an iron saturation of 27%.  Overall, I would say performance status is ECOG 1.  Medications:  Allergies as of 10/21/2022   No Known Allergies      Medication List        Accurate as of October 21, 2022  8:47 AM. If you have any questions, ask your nurse or doctor.          aspirin EC 81 MG tablet Take 81 mg by mouth daily.   atorvastatin 20 MG tablet Commonly known as: LIPITOR Take 20 mg by mouth at bedtime.   CALCIUM 1200 PO Take 1,200 mg by mouth 2 (two) times daily.   carvedilol 6.25 MG tablet Commonly known as: COREG Take 1 tablet (6.25 mg total) by mouth 2 (two) times daily.   diphenhydramine-acetaminophen 25-500 MG Tabs tablet Commonly known as: TYLENOL PM Take 1 tablet by mouth at bedtime as needed.   Farxiga 10 MG Tabs tablet Generic drug: dapagliflozin propanediol Take 1 tablet (10 mg total) by mouth daily before breakfast.   furosemide 40 MG tablet Commonly known as: LASIX Take 1 tablet (40 mg total) by mouth 2 (two) times a week. Monday and Friday   hydroxyurea  500 MG capsule Commonly known as: HYDREA TAKE 1 CAPSULE(500 MG) BY MOUTH DAILY. MAY TAKE WITH FOOD TO MINIMIZE GI SIDE EFFECTS   Magnesium 200 MG Tabs Take 1 tablet by mouth 2 (two) times daily.   multivitamin with minerals Tabs tablet Take 1 tablet by mouth daily.   spironolactone 25 MG tablet Commonly known as: ALDACTONE Take 1 tablet (25 mg total) by mouth daily.   Turmeric 500 MG Caps Take 1 capsule by mouth 2 (two) times daily.        Allergies: No Known Allergies  Past Medical History, Surgical history, Social history, and Family History were reviewed and updated.  Review of Systems: Review of Systems  Constitutional: Negative.   HENT: Negative.    Eyes: Negative.   Respiratory: Negative.    Cardiovascular: Negative.   Gastrointestinal: Negative.   Genitourinary: Negative.   Musculoskeletal: Negative.   Skin: Negative.   Neurological: Negative.   Endo/Heme/Allergies: Negative.   Psychiatric/Behavioral: Negative.        Physical Exam:  height is 4\' 11"  (1.499 m) and weight is 147 lb (66.7 kg). Her oral temperature is 98.3 F (36.8 C). Her blood pressure is 147/78 (abnormal) and her pulse is 66. Her  respiration is 20 and oxygen saturation is 100%.   Wt Readings from Last 3 Encounters:  10/21/22 147 lb (66.7 kg)  09/09/22 143 lb 1.9 oz (64.9 kg)  06/17/22 143 lb (64.9 kg)   Physical Exam Vitals reviewed.  HENT:     Head: Normocephalic and atraumatic.  Eyes:     Pupils: Pupils are equal, round, and reactive to light.  Cardiovascular:     Rate and Rhythm: Normal rate and regular rhythm.     Heart sounds: Normal heart sounds.  Pulmonary:     Effort: Pulmonary effort is normal.     Breath sounds: Normal breath sounds.  Abdominal:     General: Bowel sounds are normal.     Palpations: Abdomen is soft.  Musculoskeletal:        General: No tenderness or deformity. Normal range of motion.     Cervical back: Normal range of motion.  Lymphadenopathy:      Cervical: No cervical adenopathy.  Skin:    General: Skin is warm and dry.     Findings: No erythema or rash.  Neurological:     Mental Status: She is alert and oriented to person, place, and time.  Psychiatric:        Behavior: Behavior normal.        Thought Content: Thought content normal.        Judgment: Judgment normal.     Lab Results  Component Value Date   WBC 2.9 (L) 10/21/2022   HGB 13.2 10/21/2022   HCT 42.5 10/21/2022   MCV 101.2 (H) 10/21/2022   PLT 456 (H) 10/21/2022   Lab Results  Component Value Date   FERRITIN 52 09/09/2022   IRON 104 09/09/2022   TIBC 379 09/09/2022   UIBC 275 09/09/2022   IRONPCTSAT 27 09/09/2022   Lab Results  Component Value Date   RETICCTPCT 1.7 10/21/2022   RBC 4.17 10/21/2022   No results found for: "KPAFRELGTCHN", "LAMBDASER", "KAPLAMBRATIO" No results found for: "IGGSERUM", "IGA", "IGMSERUM" No results found for: "TOTALPROTELP", "ALBUMINELP", "A1GS", "A2GS", "BETS", "BETA2SER", "GAMS", "MSPIKE", "SPEI"   Chemistry      Component Value Date/Time   NA 142 09/09/2022 0850   K 4.6 09/09/2022 0850   CL 102 09/09/2022 0850   CO2 31 09/09/2022 0850   BUN 35 (H) 09/09/2022 0850   CREATININE 1.62 (H) 09/09/2022 0850      Component Value Date/Time   CALCIUM 10.4 (H) 09/09/2022 0850   ALKPHOS 68 09/09/2022 0850   AST 28 09/09/2022 0850   ALT 20 09/09/2022 0850   BILITOT 0.7 09/09/2022 0850       Impression and Plan: [name] is a very pleasant 72 yo African American female with essential thrombocythemia, JAK 2 + and CALR +.   Eyelid and her blood smear under the microscope.  I do not see any thing that looked suspicious.  Her white cell count is on the lower side but it is certainly little bit higher which is nice to see.  The ANC is 1400.  I would have her continue to take the Hydrea every other day for right now.  At this point, we will plan to get her back in 2 months now.  Will try to get her through most of the  Summer.  I know that she will be busy with her garden.   Josph Macho, MD 7/12/20248:47 AM

## 2022-10-27 ENCOUNTER — Other Ambulatory Visit (HOSPITAL_COMMUNITY): Payer: Self-pay | Admitting: Family Medicine

## 2022-11-12 ENCOUNTER — Other Ambulatory Visit: Payer: Self-pay | Admitting: Hematology & Oncology

## 2022-12-23 ENCOUNTER — Encounter: Payer: Self-pay | Admitting: Hematology & Oncology

## 2022-12-23 ENCOUNTER — Inpatient Hospital Stay: Payer: Medicare Other | Admitting: Hematology & Oncology

## 2022-12-23 ENCOUNTER — Other Ambulatory Visit: Payer: Self-pay

## 2022-12-23 ENCOUNTER — Inpatient Hospital Stay: Payer: Medicare Other | Attending: Hematology & Oncology

## 2022-12-23 VITALS — BP 107/60 | HR 75 | Temp 98.6°F | Resp 16 | Ht 59.0 in | Wt 144.0 lb

## 2022-12-23 DIAGNOSIS — Z79899 Other long term (current) drug therapy: Secondary | ICD-10-CM | POA: Insufficient documentation

## 2022-12-23 DIAGNOSIS — Z7982 Long term (current) use of aspirin: Secondary | ICD-10-CM | POA: Insufficient documentation

## 2022-12-23 DIAGNOSIS — D473 Essential (hemorrhagic) thrombocythemia: Secondary | ICD-10-CM | POA: Insufficient documentation

## 2022-12-23 LAB — CBC WITH DIFFERENTIAL (CANCER CENTER ONLY)
Abs Immature Granulocytes: 0.03 10*3/uL (ref 0.00–0.07)
Basophils Absolute: 0 10*3/uL (ref 0.0–0.1)
Basophils Relative: 1 %
Eosinophils Absolute: 0.3 10*3/uL (ref 0.0–0.5)
Eosinophils Relative: 8 %
HCT: 46.6 % — ABNORMAL HIGH (ref 36.0–46.0)
Hemoglobin: 14.5 g/dL (ref 12.0–15.0)
Immature Granulocytes: 1 %
Lymphocytes Relative: 27 %
Lymphs Abs: 0.9 10*3/uL (ref 0.7–4.0)
MCH: 30 pg (ref 26.0–34.0)
MCHC: 31.1 g/dL (ref 30.0–36.0)
MCV: 96.5 fL (ref 80.0–100.0)
Monocytes Absolute: 0.5 10*3/uL (ref 0.1–1.0)
Monocytes Relative: 14 %
Neutro Abs: 1.6 10*3/uL — ABNORMAL LOW (ref 1.7–7.7)
Neutrophils Relative %: 49 %
Platelet Count: 553 10*3/uL — ABNORMAL HIGH (ref 150–400)
RBC: 4.83 MIL/uL (ref 3.87–5.11)
RDW: 12.6 % (ref 11.5–15.5)
WBC Count: 3.3 10*3/uL — ABNORMAL LOW (ref 4.0–10.5)
nRBC: 0 % (ref 0.0–0.2)

## 2022-12-23 LAB — CMP (CANCER CENTER ONLY)
ALT: 26 U/L (ref 0–44)
AST: 29 U/L (ref 15–41)
Albumin: 4.3 g/dL (ref 3.5–5.0)
Alkaline Phosphatase: 70 U/L (ref 38–126)
Anion gap: 6 (ref 5–15)
BUN: 30 mg/dL — ABNORMAL HIGH (ref 8–23)
CO2: 31 mmol/L (ref 22–32)
Calcium: 10.5 mg/dL — ABNORMAL HIGH (ref 8.9–10.3)
Chloride: 102 mmol/L (ref 98–111)
Creatinine: 1.52 mg/dL — ABNORMAL HIGH (ref 0.44–1.00)
GFR, Estimated: 36 mL/min — ABNORMAL LOW (ref >60)
Glucose, Bld: 100 mg/dL — ABNORMAL HIGH (ref 70–99)
Potassium: 4.6 mmol/L (ref 3.5–5.1)
Sodium: 139 mmol/L (ref 135–145)
Total Bilirubin: 0.5 mg/dL (ref 0.3–1.2)
Total Protein: 7 g/dL (ref 6.5–8.1)

## 2022-12-23 LAB — LACTATE DEHYDROGENASE: LDH: 241 U/L — ABNORMAL HIGH (ref 98–192)

## 2022-12-23 LAB — IRON AND IRON BINDING CAPACITY (CC-WL,HP ONLY)
Iron: 72 ug/dL (ref 28–170)
Saturation Ratios: 18 % (ref 10.4–31.8)
TIBC: 410 ug/dL (ref 250–450)
UIBC: 338 ug/dL (ref 148–442)

## 2022-12-23 LAB — FERRITIN: Ferritin: 31 ng/mL (ref 11–307)

## 2022-12-23 LAB — SAVE SMEAR(SSMR), FOR PROVIDER SLIDE REVIEW

## 2022-12-23 NOTE — Progress Notes (Signed)
Hematology and Oncology Follow Up Visit  Tina Jenkins [id number] 1950-06-28 72 y.o. 12/23/2022   Principle Diagnosis:  Essential thrombocythemia-  JAK2+/CALR+   Current Therapy:        Hydrea 500 mg p.o. daily-start on 10/02/2021  - taking every other day now  Interim History:  Tina Jenkins is here today with her husband for follow-up.  She has been doing pretty well.  She is having some kidney issues.  I think that she does see a Nephrologist.  She and her husband have been busy planting for the [place].  I know that they will be very fruitful in the garden..  She has had no problems with the Hydrea.  She has had no nausea or vomiting.  She has had no cough or shortness of breath.  She has had no leg swelling..  She does have knee problems.  Her left knee, in particular, bothers her.  She is does go to a clinic and get some injections.  She says the injections do help..    She has had no issues with iron.  When we last saw her, her ferritin was 29 with an iron saturation of 16%.  She has had no rashes.  There is been no bleeding.  She has had no fever.  Overall, her performance status is ECOG 1.    Medications:  Allergies as of 12/23/2022   No Known Allergies      Medication List        Accurate as of December 23, 2022  9:05 AM. If you have any questions, ask your nurse or doctor.          aspirin EC 81 MG tablet Take 81 mg by mouth daily.   atorvastatin 20 MG tablet Commonly known as: LIPITOR Take 20 mg by mouth at bedtime.   CALCIUM 1200 PO Take 1,200 mg by mouth 2 (two) times daily.   carvedilol 6.25 MG tablet Commonly known as: COREG TAKE 1 TABLET(6.25 MG) BY MOUTH TWICE DAILY   diphenhydramine-acetaminophen 25-500 MG Tabs tablet Commonly known as: TYLENOL PM Take 1 tablet by mouth at bedtime as needed.   Farxiga 10 MG Tabs tablet Generic drug: dapagliflozin propanediol Take 1 tablet (10 mg total) by mouth daily before breakfast.   furosemide 40  MG tablet Commonly known as: LASIX Take 1 tablet (40 mg total) by mouth 2 (two) times a week. Monday and Friday   hydroxyurea 500 MG capsule Commonly known as: HYDREA TAKE 1 CAPSULE(500 MG) BY MOUTH DAILY. MAY TAKE WITH FOOD TO MINIMIZE GI SIDE EFFECTS   Magnesium 200 MG Tabs Take 1 tablet by mouth 2 (two) times daily.   multivitamin with minerals Tabs tablet Take 1 tablet by mouth daily.   spironolactone 25 MG tablet Commonly known as: ALDACTONE Take 1 tablet (25 mg total) by mouth daily.   Turmeric 500 MG Caps Take 1 capsule by mouth 2 (two) times daily.        Allergies: No Known Allergies  Past Medical History, Surgical history, Social history, and Family History were reviewed and updated.  Review of Systems: Review of Systems  Constitutional: Negative.   HENT: Negative.    Eyes: Negative.   Respiratory: Negative.    Cardiovascular: Negative.   Gastrointestinal: Negative.   Genitourinary: Negative.   Musculoskeletal: Negative.   Skin: Negative.   Neurological: Negative.   Endo/Heme/Allergies: Negative.   Psychiatric/Behavioral: Negative.        Physical Exam: Vital signs show a temperature  of 98.6.  Pulse 75.  Blood pressure 107/60.  Weight is 144 pounds.  Wt Readings from Last 3 Encounters:  10/21/22 147 lb (66.7 kg)  09/09/22 143 lb 1.9 oz (64.9 kg)  06/17/22 143 lb (64.9 kg)   Physical Exam Vitals reviewed.  HENT:     Head: Normocephalic and atraumatic.  Eyes:     Pupils: Pupils are equal, round, and reactive to light.  Cardiovascular:     Rate and Rhythm: Normal rate and regular rhythm.     Heart sounds: Normal heart sounds.  Pulmonary:     Effort: Pulmonary effort is normal.     Breath sounds: Normal breath sounds.  Abdominal:     General: Bowel sounds are normal.     Palpations: Abdomen is soft.  Musculoskeletal:        General: No tenderness or deformity. Normal range of motion.     Cervical back: Normal range of motion.   Lymphadenopathy:     Cervical: No cervical adenopathy.  Skin:    General: Skin is warm and dry.     Findings: No erythema or rash.  Neurological:     Mental Status: She is alert and oriented to person, place, and time.  Psychiatric:        Behavior: Behavior normal.        Thought Content: Thought content normal.        Judgment: Judgment normal.     Lab Results  Component Value Date   WBC 3.3 (L) 12/23/2022   HGB 14.5 12/23/2022   HCT 46.6 (H) 12/23/2022   MCV 96.5 12/23/2022   PLT 553 (H) 12/23/2022   Lab Results  Component Value Date   FERRITIN 29 10/21/2022   IRON 58 10/21/2022   TIBC 354 10/21/2022   UIBC 296 10/21/2022   IRONPCTSAT 16 10/21/2022   Lab Results  Component Value Date   RETICCTPCT 1.7 10/21/2022   RBC 4.83 12/23/2022   No results found for: "KPAFRELGTCHN", "LAMBDASER", "KAPLAMBRATIO" No results found for: "IGGSERUM", "IGA", "IGMSERUM" No results found for: "TOTALPROTELP", "ALBUMINELP", "A1GS", "A2GS", "BETS", "BETA2SER", "GAMS", "MSPIKE", "SPEI"   Chemistry      Component Value Date/Time   NA 142 10/21/2022 0825   K 4.1 10/21/2022 0825   CL 108 10/21/2022 0825   CO2 27 10/21/2022 0825   BUN 24 (H) 10/21/2022 0825   CREATININE 1.36 (H) 10/21/2022 0825      Component Value Date/Time   CALCIUM 10.0 10/21/2022 0825   ALKPHOS 59 10/21/2022 0825   AST 35 10/21/2022 0825   ALT 30 10/21/2022 0825   BILITOT 0.5 10/21/2022 0825       Impression and Plan: Tina Jenkins is a very pleasant 72 yo African American female with essential thrombocythemia, JAK 2 + and CALR +.   I am happy that her white blood cell count is trending upward.  However, the platelet count is also trending upward.  We will have to see what her iron studies show.  We may have to consider getting her on some oral iron if her iron studies are low but on the low side.  We will have to follow her closely.  I probably need to see her back in 6 weeks.  Am happy that her quality of  life is doing so well.  She and her husband have been quite busy.   Josph Macho, MD 9/13/20249:05 AM

## 2023-01-11 ENCOUNTER — Other Ambulatory Visit (HOSPITAL_BASED_OUTPATIENT_CLINIC_OR_DEPARTMENT_OTHER): Payer: Self-pay

## 2023-01-27 ENCOUNTER — Inpatient Hospital Stay: Payer: Medicare Other | Admitting: Hematology & Oncology

## 2023-01-27 ENCOUNTER — Encounter: Payer: Self-pay | Admitting: Hematology & Oncology

## 2023-01-27 ENCOUNTER — Inpatient Hospital Stay: Payer: Medicare Other | Attending: Hematology & Oncology

## 2023-01-27 VITALS — BP 127/76 | HR 64 | Temp 99.1°F | Resp 20 | Ht 59.0 in | Wt 148.1 lb

## 2023-01-27 DIAGNOSIS — D473 Essential (hemorrhagic) thrombocythemia: Secondary | ICD-10-CM | POA: Insufficient documentation

## 2023-01-27 DIAGNOSIS — Z79899 Other long term (current) drug therapy: Secondary | ICD-10-CM | POA: Diagnosis not present

## 2023-01-27 LAB — CBC WITH DIFFERENTIAL (CANCER CENTER ONLY)
Abs Immature Granulocytes: 0.04 10*3/uL (ref 0.00–0.07)
Basophils Absolute: 0 10*3/uL (ref 0.0–0.1)
Basophils Relative: 1 %
Eosinophils Absolute: 0.2 10*3/uL (ref 0.0–0.5)
Eosinophils Relative: 6 %
HCT: 45.3 % (ref 36.0–46.0)
Hemoglobin: 14.1 g/dL (ref 12.0–15.0)
Immature Granulocytes: 1 %
Lymphocytes Relative: 25 %
Lymphs Abs: 0.9 10*3/uL (ref 0.7–4.0)
MCH: 29.4 pg (ref 26.0–34.0)
MCHC: 31.1 g/dL (ref 30.0–36.0)
MCV: 94.6 fL (ref 80.0–100.0)
Monocytes Absolute: 0.5 10*3/uL (ref 0.1–1.0)
Monocytes Relative: 15 %
Neutro Abs: 1.8 10*3/uL (ref 1.7–7.7)
Neutrophils Relative %: 52 %
Platelet Count: 479 10*3/uL — ABNORMAL HIGH (ref 150–400)
RBC: 4.79 MIL/uL (ref 3.87–5.11)
RDW: 14.3 % (ref 11.5–15.5)
WBC Count: 3.4 10*3/uL — ABNORMAL LOW (ref 4.0–10.5)
nRBC: 0 % (ref 0.0–0.2)

## 2023-01-27 LAB — CMP (CANCER CENTER ONLY)
ALT: 27 U/L (ref 0–44)
AST: 32 U/L (ref 15–41)
Albumin: 4 g/dL (ref 3.5–5.0)
Alkaline Phosphatase: 81 U/L (ref 38–126)
Anion gap: 5 (ref 5–15)
BUN: 32 mg/dL — ABNORMAL HIGH (ref 8–23)
CO2: 33 mmol/L — ABNORMAL HIGH (ref 22–32)
Calcium: 10.2 mg/dL (ref 8.9–10.3)
Chloride: 104 mmol/L (ref 98–111)
Creatinine: 1.33 mg/dL — ABNORMAL HIGH (ref 0.44–1.00)
GFR, Estimated: 42 mL/min — ABNORMAL LOW (ref >60)
Glucose, Bld: 99 mg/dL (ref 70–99)
Potassium: 5 mmol/L (ref 3.5–5.1)
Sodium: 142 mmol/L (ref 135–145)
Total Bilirubin: 0.5 mg/dL (ref 0.3–1.2)
Total Protein: 6.8 g/dL (ref 6.5–8.1)

## 2023-01-27 LAB — RETICULOCYTES
Immature Retic Fract: 10.2 % (ref 2.3–15.9)
RBC.: 4.71 MIL/uL (ref 3.87–5.11)
Retic Count, Absolute: 76.3 10*3/uL (ref 19.0–186.0)
Retic Ct Pct: 1.6 % (ref 0.4–3.1)

## 2023-01-27 LAB — FERRITIN: Ferritin: 26 ng/mL (ref 11–307)

## 2023-01-27 LAB — IRON AND IRON BINDING CAPACITY (CC-WL,HP ONLY)
Iron: 74 ug/dL (ref 28–170)
Saturation Ratios: 19 % (ref 10.4–31.8)
TIBC: 395 ug/dL (ref 250–450)
UIBC: 321 ug/dL (ref 148–442)

## 2023-01-27 NOTE — Progress Notes (Signed)
Hematology and Oncology Follow Up Visit  Tina Jenkins [id number] 07-26-1950 72 y.o. 01/27/2023   Principle Diagnosis:  Essential thrombocythemia-  JAK2+/CALR+   Current Therapy:        Hydrea 500 mg p.o. daily-start on 10/02/2021  - taking every other day now  Interim History:  Tina Jenkins is here today with her husband for follow-up.  She really looks quite good.  She is having no problems at all with the Hydrea.  She she takes Hydrea 4 times a week.  There has been no nausea or vomiting.  She has had no problems with rashes.  She has had no diarrhea or constipation.  There has been no cough or shortness of breath.  She has had some knee issues.  I think she does get some injections.  Her appetite is good.  Her last iron studies that were done back in September showed a ferritin of 31 with an iron saturation of 18%.  She has had no fever.  She did see the nephrologist.  He said that everything was okay with her kidneys.  He does not need to see her for 6 months.  Overall, I would say that her performance status is probably ECOG 1.     Medications:  Allergies as of 01/27/2023   No Known Allergies      Medication List        Accurate as of January 27, 2023  8:32 AM. If you have any questions, ask your nurse or doctor.          aspirin EC 81 MG tablet Take 81 mg by mouth daily.   atorvastatin 20 MG tablet Commonly known as: LIPITOR Take 20 mg by mouth at bedtime.   CALCIUM 1200 PO Take 1,200 mg by mouth 2 (two) times daily.   carvedilol 6.25 MG tablet Commonly known as: COREG TAKE 1 TABLET(6.25 MG) BY MOUTH TWICE DAILY   diphenhydramine-acetaminophen 25-500 MG Tabs tablet Commonly known as: TYLENOL PM Take 1 tablet by mouth at bedtime as needed.   Farxiga 10 MG Tabs tablet Generic drug: dapagliflozin propanediol Take 1 tablet (10 mg total) by mouth daily before breakfast.   furosemide 40 MG tablet Commonly known as: LASIX Take 1 tablet (40 mg total)  by mouth 2 (two) times a week. Monday and Friday   hydroxyurea 500 MG capsule Commonly known as: HYDREA TAKE 1 CAPSULE(500 MG) BY MOUTH DAILY. MAY TAKE WITH FOOD TO MINIMIZE GI SIDE EFFECTS What changed: See the new instructions.   Magnesium 200 MG Tabs Take 1 tablet by mouth 2 (two) times daily.   multivitamin with minerals Tabs tablet Take 1 tablet by mouth daily.   spironolactone 25 MG tablet Commonly known as: ALDACTONE Take 1 tablet (25 mg total) by mouth daily.   Turmeric 500 MG Caps Take 1 capsule by mouth 2 (two) times daily.        Allergies: No Known Allergies  Past Medical History, Surgical history, Social history, and Family History were reviewed and updated.  Review of Systems: Review of Systems  Constitutional: Negative.   HENT: Negative.    Eyes: Negative.   Respiratory: Negative.    Cardiovascular: Negative.   Gastrointestinal: Negative.   Genitourinary: Negative.   Musculoskeletal: Negative.   Skin: Negative.   Neurological: Negative.   Endo/Heme/Allergies: Negative.   Psychiatric/Behavioral: Negative.        Physical Exam: Vital signs show a temperature of 99.1.  Pulse 64.  Blood pressure 127/76.  Weight is  148 pounds.   Wt Readings from Last 3 Encounters:  01/27/23 148 lb 1.9 oz (67.2 kg)  12/23/22 144 lb (65.3 kg)  10/21/22 147 lb (66.7 kg)   Physical Exam Vitals reviewed.  HENT:     Head: Normocephalic and atraumatic.  Eyes:     Pupils: Pupils are equal, round, and reactive to light.  Cardiovascular:     Rate and Rhythm: Normal rate and regular rhythm.     Heart sounds: Normal heart sounds.  Pulmonary:     Effort: Pulmonary effort is normal.     Breath sounds: Normal breath sounds.  Abdominal:     General: Bowel sounds are normal.     Palpations: Abdomen is soft.  Musculoskeletal:        General: No tenderness or deformity. Normal range of motion.     Cervical back: Normal range of motion.  Lymphadenopathy:     Cervical: No  cervical adenopathy.  Skin:    General: Skin is warm and dry.     Findings: No erythema or rash.  Neurological:     Mental Status: She is alert and oriented to person, place, and time.  Psychiatric:        Behavior: Behavior normal.        Thought Content: Thought content normal.        Judgment: Judgment normal.    Lab Results  Component Value Date   WBC 3.4 (L) 01/27/2023   HGB 14.1 01/27/2023   HCT 45.3 01/27/2023   MCV 94.6 01/27/2023   PLT 479 (H) 01/27/2023   Lab Results  Component Value Date   FERRITIN 31 12/23/2022   IRON 72 12/23/2022   TIBC 410 12/23/2022   UIBC 338 12/23/2022   IRONPCTSAT 18 12/23/2022   Lab Results  Component Value Date   RETICCTPCT 1.6 01/27/2023   RBC 4.71 01/27/2023   No results found for: "KPAFRELGTCHN", "LAMBDASER", "KAPLAMBRATIO" No results found for: "IGGSERUM", "IGA", "IGMSERUM" No results found for: "TOTALPROTELP", "ALBUMINELP", "A1GS", "A2GS", "BETS", "BETA2SER", "GAMS", "MSPIKE", "SPEI"   Chemistry      Component Value Date/Time   NA 139 12/23/2022 0818   K 4.6 12/23/2022 0818   CL 102 12/23/2022 0818   CO2 31 12/23/2022 0818   BUN 30 (H) 12/23/2022 0818   CREATININE 1.52 (H) 12/23/2022 0818      Component Value Date/Time   CALCIUM 10.5 (H) 12/23/2022 0818   ALKPHOS 70 12/23/2022 0818   AST 29 12/23/2022 0818   ALT 26 12/23/2022 0818   BILITOT 0.5 12/23/2022 0818       Impression and Plan: Tina Jenkins is a very pleasant 72 yo African American female with essential thrombocythemia, JAK 2 + and CALR +.   So far, everything looks wonderful with her blood.  I have to say that under the microscope, her blood smear looks relatively normal.  Her platelets are still little bit large.  They are well granulated.  Her white cells appear mature.  We can now try to move her appointments back a little bit longer.  Will try to get her back after Thanksgiving.  I know that they will be busy and there [place].  They are  certainly experts in growing produce.   Josph Macho, MD 10/18/20248:32 AM

## 2023-03-31 ENCOUNTER — Inpatient Hospital Stay: Payer: Medicare Other | Admitting: Hematology & Oncology

## 2023-03-31 ENCOUNTER — Inpatient Hospital Stay: Payer: Medicare Other

## 2023-04-03 ENCOUNTER — Other Ambulatory Visit: Payer: Self-pay

## 2023-04-03 ENCOUNTER — Emergency Department (HOSPITAL_BASED_OUTPATIENT_CLINIC_OR_DEPARTMENT_OTHER)
Admission: EM | Admit: 2023-04-03 | Discharge: 2023-04-03 | Disposition: A | Discharge status: Home / Self Care | Payer: Medicare Other | Attending: Emergency Medicine | Admitting: Emergency Medicine

## 2023-04-03 ENCOUNTER — Encounter (HOSPITAL_BASED_OUTPATIENT_CLINIC_OR_DEPARTMENT_OTHER): Payer: Self-pay

## 2023-04-03 ENCOUNTER — Emergency Department (HOSPITAL_BASED_OUTPATIENT_CLINIC_OR_DEPARTMENT_OTHER): Payer: Medicare Other

## 2023-04-03 DIAGNOSIS — J069 Acute upper respiratory infection, unspecified: Secondary | ICD-10-CM | POA: Diagnosis not present

## 2023-04-03 DIAGNOSIS — R519 Headache, unspecified: Secondary | ICD-10-CM | POA: Diagnosis not present

## 2023-04-03 DIAGNOSIS — Z20822 Contact with and (suspected) exposure to covid-19: Secondary | ICD-10-CM | POA: Insufficient documentation

## 2023-04-03 DIAGNOSIS — I509 Heart failure, unspecified: Secondary | ICD-10-CM | POA: Insufficient documentation

## 2023-04-03 DIAGNOSIS — R059 Cough, unspecified: Secondary | ICD-10-CM | POA: Diagnosis present

## 2023-04-03 DIAGNOSIS — Z7982 Long term (current) use of aspirin: Secondary | ICD-10-CM | POA: Insufficient documentation

## 2023-04-03 LAB — RESP PANEL BY RT-PCR (RSV, FLU A&B, COVID)  RVPGX2
Influenza A by PCR: NEGATIVE
Influenza B by PCR: NEGATIVE
Resp Syncytial Virus by PCR: NEGATIVE
SARS Coronavirus 2 by RT PCR: NEGATIVE

## 2023-04-03 MED ORDER — BENZONATATE 100 MG PO CAPS: 100.0000 mg | ORAL_CAPSULE | Freq: Three times a day (TID) | ORAL | 0 refills | Status: DC

## 2023-04-03 MED ORDER — FLUTICASONE PROPIONATE 50 MCG/ACT NA SUSP: 2.0000 | Freq: Every day | NASAL | 2 refills | Status: DC

## 2023-04-03 MED ORDER — GUAIFENESIN ER 600 MG PO TB12
600.0000 mg | ORAL_TABLET | Freq: Two times a day (BID) | ORAL | 0 refills | Status: AC
Start: 2023-04-03 — End: 2023-04-13

## 2023-04-03 NOTE — ED Provider Notes (Signed)
[place] [facility] Provider Note   CSN: [id number] Arrival date & time: 04/03/23  1225     History  Chief Complaint  Patient presents with   Headache   Cough    Tina Jenkins is a 72 y.o. female past medical history significant for CHF and acute respiratory failure with hypoxia presents today for cough x 1 week.  Patient states that it was initially not productive but seems to become productive today.  Patient also notes that the cough is caused her to have a headache as well.  Patient has tried Alka-Seltzer and NyQuil cold and flu that relief.  Patient denies fever, chills, body aches, nausea, vomiting, ear pain, weakness, or abdominal pain.  Patient also endorses congestion.   Headache Associated symptoms: cough   Cough Associated symptoms: headaches        Home Medications Prior to Admission medications   Medication Sig Start Date End Date Taking? Authorizing Provider  benzonatate (TESSALON) 100 MG capsule Take 1 capsule (100 mg total) by mouth every 8 (eight) hours. 04/03/23  Yes Dolphus Jenny, PA-C  fluticasone (FLONASE) 50 MCG/ACT nasal spray Place 2 sprays into both nostrils daily. 04/03/23  Yes Dolphus Jenny, PA-C  guaiFENesin (MUCINEX) 600 MG 12 hr tablet Take 1 tablet (600 mg total) by mouth 2 (two) times daily for 10 days. 04/03/23 04/13/23 Yes Dolphus Jenny, PA-C  aspirin EC 81 MG tablet Take 81 mg by mouth daily.    [provider]  atorvastatin (LIPITOR) 20 MG tablet Take 20 mg by mouth at bedtime.     [provider]  Calcium Carbonate-Vit D-Min (CALCIUM 1200 PO) Take 1,200 mg by mouth 2 (two) times daily.    [provider]  carvedilol (COREG) 6.25 MG tablet TAKE 1 TABLET(6.25 MG) BY MOUTH TWICE DAILY 10/27/22   Bensimhon, Bevelyn Buckles, MD  dapagliflozin propanediol (FARXIGA) 10 MG TABS tablet Take 1 tablet (10 mg total) by mouth daily before breakfast. 09/16/22   [place], Anderson Malta, FNP   diphenhydramine-acetaminophen (TYLENOL PM) 25-500 MG TABS tablet Take 1 tablet by mouth at bedtime as needed.    [provider]  furosemide (LASIX) 40 MG tablet Take 1 tablet (40 mg total) by mouth 2 (two) times a week. Monday and Friday 03/17/22   Bensimhon, Bevelyn Buckles, MD  hydroxyurea (HYDREA) 500 MG capsule TAKE 1 CAPSULE(500 MG) BY MOUTH DAILY. MAY TAKE WITH FOOD TO MINIMIZE GI SIDE EFFECTS Patient taking differently: Take 500 mg by mouth. Takes 3 times weekly. 11/13/22   Josph Macho, MD  Magnesium 200 MG TABS Take 1 tablet by mouth 2 (two) times daily.    [provider]  Multiple Vitamin (MULTIVITAMIN WITH MINERALS) TABS Take 1 tablet by mouth daily.    [provider]  spironolactone (ALDACTONE) 25 MG tablet Take 1 tablet (25 mg total) by mouth daily. 10/14/22   Bensimhon, Bevelyn Buckles, MD  Turmeric 500 MG CAPS Take 1 capsule by mouth 2 (two) times daily.    [provider]      Allergies    Patient has no known allergies.    Review of Systems   Review of Systems  Respiratory:  Positive for cough.   Neurological:  Positive for headaches.    Physical Exam Updated Vital Signs BP 130/80   Pulse 88   Temp 98.7 F (37.1 C) (Oral)   Ht 4\' 11"  (1.499 m)   Wt 66.2 kg  SpO2 98%   BMI 29.49 kg/m  Physical Exam Vitals and nursing note reviewed.  Constitutional:      General: She is not in acute distress.    Appearance: She is well-developed. She is not ill-appearing.  HENT:     Head: Normocephalic and atraumatic.     Mouth/Throat:     Mouth: Mucous membranes are moist.     Pharynx: Oropharynx is clear.  Eyes:     Conjunctiva/sclera: Conjunctivae normal.     Pupils: Pupils are equal, round, and reactive to light.  Neck:     Meningeal: Brudzinski's sign absent.  Cardiovascular:     Rate and Rhythm: Normal rate and regular rhythm.     Heart sounds: Normal heart sounds. No murmur heard. Pulmonary:     Effort: Pulmonary effort is normal. No  respiratory distress.     Breath sounds: Normal breath sounds. No stridor. No wheezing or rhonchi.  Abdominal:     Palpations: Abdomen is soft.     Tenderness: There is no abdominal tenderness.  Musculoskeletal:        General: No swelling.     Cervical back: Neck supple. No rigidity.  Skin:    General: Skin is warm and dry.     Capillary Refill: Capillary refill takes less than 2 seconds.  Neurological:     Mental Status: She is alert.     GCS: GCS eye subscore is 4. GCS verbal subscore is 5. GCS motor subscore is 6.  Psychiatric:        Mood and Affect: Mood normal.     ED Results / Procedures / Treatments   Labs (all labs ordered are listed, but only abnormal results are displayed) Labs Reviewed  RESP PANEL BY RT-PCR (RSV, FLU A&B, COVID)  [id number]    EKG None  Radiology DG Chest 2 View Result Date: 04/03/2023 CLINICAL DATA:  Cough for 1 week.  Dyspnea.  Headache. EXAM: CHEST - 2 VIEW COMPARISON:  07/24/2021 FINDINGS: The heart size and mediastinal contours are within normal limits. Mild scarring is again seen in the central left upper lobe and right lung base. No evidence of acute infiltrate, edema, or pleural effusion. IMPRESSION: Mild bilateral scarring.  No active cardiopulmonary disease. Electronically Signed   By: Danae Orleans M.D.   On: 04/03/2023 15:26    Procedures Procedures    Medications Ordered in ED Medications - No data to display  ED Course/ Medical Decision Making/ A&P                                 Medical Decision Making Amount and/or Complexity of Data Reviewed Radiology: ordered.   This patient presents to the ED with chief complaint(s) of cough and headache with pertinent past medical history of CHF and acute respiratory failure which further complicates the presenting complaint. The complaint involves an extensive differential diagnosis and also carries with it a high risk of complications and morbidity.    The differential diagnosis  includes COVID, flu, RSV, URI, pneumonia  Additional history obtained: Records reviewed cardiology notes  ED Course and Reassessment:   Independent labs interpretation:  The following labs were independently interpreted:  Respiratory panel: Negative  Independent visualization of imaging: - I independently visualized the following imaging with scope of interpretation limited to determining acute life threatening conditions related to emergency care: Chest x-ray, which revealed mild bilateral scarring.  No active cardiopulmonary disease.  Consultation: -  Consulted or discussed management/test interpretation w/ external professional: None  Consideration for admission or further workup: Consider for mission further workup however patient's vital signs, physical exam, labs, and imaging of all been reassuring.  Patient symptoms likely due to upper respiratory infection.  Patient will be given symptomatic control with Tessalon Perles for cough, Flonase for congestion, and Mucinex for expectorant.  Patient should follow-up with PCP if symptoms persist for further evaluation and workup.        Final Clinical Impression(s) / ED Diagnoses Final diagnoses:  Upper respiratory tract infection, unspecified type    Rx / DC Orders ED Discharge Orders          Ordered    benzonatate (TESSALON) 100 MG capsule  Every 8 hours        04/03/23 1537    guaiFENesin (MUCINEX) 600 MG 12 hr tablet  2 times daily        04/03/23 1537    fluticasone (FLONASE) 50 MCG/ACT nasal spray  Daily        04/03/23 1537              Dolphus Jenny, PA-C 04/03/23 1538    [address], [place], DO 04/04/23 [phone]

## 2023-04-03 NOTE — ED Triage Notes (Signed)
Pt reports that she had a sore throat last week. States that she has a cough and headache.  Denies Shortness of breath

## 2023-04-03 NOTE — Discharge Instructions (Addendum)
Today you were seen for an upper respiratory infection.  Please pick up your medication and take as needed.  May also alternate taking Tylenol as needed for pain.  Thank you for letting us treat you today. After reviewing your labs and imaging, I feel you are safe to go home. Please follow up with your PCP in the next several days and provide them with your records from this visit. Return to the Emergency Room if pain becomes severe or symptoms worsen.

## 2023-04-06 ENCOUNTER — Inpatient Hospital Stay: Payer: Medicare Other

## 2023-04-06 ENCOUNTER — Inpatient Hospital Stay: Payer: Medicare Other | Admitting: Hematology & Oncology

## 2023-04-21 ENCOUNTER — Inpatient Hospital Stay: Payer: Medicare Other | Admitting: Hematology & Oncology

## 2023-04-21 ENCOUNTER — Inpatient Hospital Stay: Payer: Medicare Other | Attending: Hematology & Oncology

## 2023-04-21 ENCOUNTER — Encounter: Payer: Self-pay | Admitting: Hematology & Oncology

## 2023-04-21 VITALS — BP 117/81 | HR 75 | Temp 97.8°F | Resp 18 | Ht 59.0 in | Wt 149.4 lb

## 2023-04-21 DIAGNOSIS — D473 Essential (hemorrhagic) thrombocythemia: Secondary | ICD-10-CM

## 2023-04-21 DIAGNOSIS — Z79899 Other long term (current) drug therapy: Secondary | ICD-10-CM | POA: Diagnosis not present

## 2023-04-21 DIAGNOSIS — D500 Iron deficiency anemia secondary to blood loss (chronic): Secondary | ICD-10-CM

## 2023-04-21 DIAGNOSIS — Z7982 Long term (current) use of aspirin: Secondary | ICD-10-CM | POA: Diagnosis not present

## 2023-04-21 LAB — CBC WITH DIFFERENTIAL (CANCER CENTER ONLY)
Abs Immature Granulocytes: 0.06 10*3/uL (ref 0.00–0.07)
Basophils Absolute: 0.1 10*3/uL (ref 0.0–0.1)
Basophils Relative: 1 %
Eosinophils Absolute: 0.4 10*3/uL (ref 0.0–0.5)
Eosinophils Relative: 9 %
HCT: 44 % (ref 36.0–46.0)
Hemoglobin: 13.7 g/dL (ref 12.0–15.0)
Immature Granulocytes: 2 %
Lymphocytes Relative: 26 %
Lymphs Abs: 1 10*3/uL (ref 0.7–4.0)
MCH: 29.7 pg (ref 26.0–34.0)
MCHC: 31.1 g/dL (ref 30.0–36.0)
MCV: 95.2 fL (ref 80.0–100.0)
Monocytes Absolute: 0.6 10*3/uL (ref 0.1–1.0)
Monocytes Relative: 15 %
Neutro Abs: 1.8 10*3/uL (ref 1.7–7.7)
Neutrophils Relative %: 47 %
Platelet Count: 602 10*3/uL — ABNORMAL HIGH (ref 150–400)
RBC: 4.62 MIL/uL (ref 3.87–5.11)
RDW: 15.2 % (ref 11.5–15.5)
WBC Count: 3.9 10*3/uL — ABNORMAL LOW (ref 4.0–10.5)
nRBC: 0 % (ref 0.0–0.2)

## 2023-04-21 LAB — CMP (CANCER CENTER ONLY)
ALT: 26 U/L (ref 0–44)
AST: 28 U/L (ref 15–41)
Albumin: 4.2 g/dL (ref 3.5–5.0)
Alkaline Phosphatase: 84 U/L (ref 38–126)
Anion gap: 6 (ref 5–15)
BUN: 43 mg/dL — ABNORMAL HIGH (ref 8–23)
CO2: 32 mmol/L (ref 22–32)
Calcium: 10.4 mg/dL — ABNORMAL HIGH (ref 8.9–10.3)
Chloride: 101 mmol/L (ref 98–111)
Creatinine: 1.89 mg/dL — ABNORMAL HIGH (ref 0.44–1.00)
GFR, Estimated: 28 mL/min — ABNORMAL LOW (ref >60)
Glucose, Bld: 103 mg/dL — ABNORMAL HIGH (ref 70–99)
Potassium: 4.8 mmol/L (ref 3.5–5.1)
Sodium: 139 mmol/L (ref 135–145)
Total Bilirubin: 0.5 mg/dL (ref 0.0–1.2)
Total Protein: 7 g/dL (ref 6.5–8.1)

## 2023-04-21 LAB — SAVE SMEAR(SSMR), FOR PROVIDER SLIDE REVIEW

## 2023-04-21 LAB — IRON AND IRON BINDING CAPACITY (CC-WL,HP ONLY)
Iron: 103 ug/dL (ref 28–170)
Saturation Ratios: 27 % (ref 10.4–31.8)
TIBC: 377 ug/dL (ref 250–450)
UIBC: 274 ug/dL (ref 148–442)

## 2023-04-21 LAB — FERRITIN: Ferritin: 84 ng/mL (ref 11–307)

## 2023-04-21 LAB — RETICULOCYTES
Immature Retic Fract: 11 % (ref 2.3–15.9)
RBC.: 4.54 MIL/uL (ref 3.87–5.11)
Retic Count, Absolute: 60.4 10*3/uL (ref 19.0–186.0)
Retic Ct Pct: 1.3 % (ref 0.4–3.1)

## 2023-04-21 LAB — LACTATE DEHYDROGENASE: LDH: 221 U/L — ABNORMAL HIGH (ref 98–192)

## 2023-04-21 NOTE — Progress Notes (Signed)
 Hematology and Oncology Follow Up Visit  Tina Jenkins [id number] 28-Nov-1950 73 y.o. 04/21/2023   Principle Diagnosis:  Essential thrombocythemia-  JAK2+/CALR+   Current Therapy:        Hydrea  500 mg p.o. daily-changed on 04/21/2023  - start on 10/02/2021   Interim History:  [name] is here today for follow-up.  We last saw her back in October.  Since then, she and her husband have been up to [place] .  There is a nice time up there.  She came back and had a upper respiratory tract infection.  She was on some antibiotics.  She is taking the Hydrea  every other day.  Her platelet count is higher today.  Will have to get her back on daily Hydrea .  She has had no issues with nausea or vomiting.  She has had no problems with cough or shortness of breath.  She has had no bleeding.  She has had no change in bowel or bladder habits.  There is been no leg swelling.  Her last iron studies back in October showed a ferritin of 26 with an iron saturation of 19%.  Overall, I would say performance status is probably ECOG 1.    Medications:  Allergies as of 04/21/2023   No Known Allergies      Medication List        Accurate as of April 21, 2023  9:21 AM. If you have any questions, ask your nurse or doctor.          STOP taking these medications    benzonatate  100 MG capsule Commonly known as: TESSALON  Stopped by: Niara Bunker R Geraldyne Barraclough   fluticasone  50 MCG/ACT nasal spray Commonly known as: FLONASE  Stopped by: Maude JONELLE Crease       TAKE these medications    aspirin  EC 81 MG tablet Take 81 mg by mouth daily.   atorvastatin  20 MG tablet Commonly known as: LIPITOR Take 20 mg by mouth at bedtime.   CALCIUM  1200 PO Take 1,200 mg by mouth 2 (two) times daily.   carvedilol  6.25 MG tablet Commonly known as: COREG  TAKE 1 TABLET(6.25 MG) BY MOUTH TWICE DAILY   diphenhydramine -acetaminophen  25-500 MG Tabs tablet Commonly known as: TYLENOL  PM Take 1 tablet by mouth at  bedtime as needed.   Farxiga  10 MG Tabs tablet Generic drug: dapagliflozin  propanediol Take 1 tablet (10 mg total) by mouth daily before breakfast.   furosemide  40 MG tablet Commonly known as: LASIX  Take 1 tablet (40 mg total) by mouth 2 (two) times a week. Monday and Friday   hydroxyurea  500 MG capsule Commonly known as: HYDREA  TAKE 1 CAPSULE(500 MG) BY MOUTH DAILY. MAY TAKE WITH FOOD TO MINIMIZE GI SIDE EFFECTS What changed: See the new instructions.   Magnesium 200 MG Tabs Take 1 tablet by mouth 2 (two) times daily.   multivitamin with minerals Tabs tablet Take 1 tablet by mouth daily.   spironolactone  25 MG tablet Commonly known as: ALDACTONE  Take 1 tablet (25 mg total) by mouth daily.   Turmeric 500 MG Caps Take 1 capsule by mouth 2 (two) times daily.        Allergies: No Known Allergies  Past Medical History, Surgical history, Social history, and Family History were reviewed and updated.  Review of Systems: Review of Systems  Constitutional: Negative.   HENT: Negative.    Eyes: Negative.   Respiratory: Negative.    Cardiovascular: Negative.   Gastrointestinal: Negative.   Genitourinary: Negative.   Musculoskeletal: Negative.  Skin: Negative.   Neurological: Negative.   Endo/Heme/Allergies: Negative.   Psychiatric/Behavioral: Negative.        Physical Exam: Vital signs show a temperature of 97.8.  Pulse 75.  Blood pressure 117/81.  Weight is 149 pounds.    Wt Readings from Last 3 Encounters:  04/21/23 149 lb 6.4 oz (67.8 kg)  04/03/23 146 lb (66.2 kg)  01/27/23 148 lb 1.9 oz (67.2 kg)   Physical Exam Vitals reviewed.  HENT:     Head: Normocephalic and atraumatic.  Eyes:     Pupils: Pupils are equal, round, and reactive to light.  Cardiovascular:     Rate and Rhythm: Normal rate and regular rhythm.     Heart sounds: Normal heart sounds.  Pulmonary:     Effort: Pulmonary effort is normal.     Breath sounds: Normal breath sounds.  Abdominal:      General: Bowel sounds are normal.     Palpations: Abdomen is soft.  Musculoskeletal:        General: No tenderness or deformity. Normal range of motion.     Cervical back: Normal range of motion.  Lymphadenopathy:     Cervical: No cervical adenopathy.  Skin:    General: Skin is warm and dry.     Findings: No erythema or rash.  Neurological:     Mental Status: She is alert and oriented to person, place, and time.  Psychiatric:        Behavior: Behavior normal.        Thought Content: Thought content normal.        Judgment: Judgment normal.     Lab Results  Component Value Date   WBC 3.9 (L) 04/21/2023   HGB 13.7 04/21/2023   HCT 44.0 04/21/2023   MCV 95.2 04/21/2023   PLT 602 (H) 04/21/2023   Lab Results  Component Value Date   FERRITIN 26 01/27/2023   IRON 74 01/27/2023   TIBC 395 01/27/2023   UIBC 321 01/27/2023   IRONPCTSAT 19 01/27/2023   Lab Results  Component Value Date   RETICCTPCT 1.3 04/21/2023   RBC 4.54 04/21/2023   No results found for: KPAFRELGTCHN, LAMBDASER, KAPLAMBRATIO No results found for: IGGSERUM, IGA, IGMSERUM No results found for: STEPHANY CARLOTA BENSON MARKEL EARLA JOANNIE DOC VICK, SPEI   Chemistry      Component Value Date/Time   NA 139 04/21/2023 0841   K 4.8 04/21/2023 0841   CL 101 04/21/2023 0841   CO2 32 04/21/2023 0841   BUN 43 (H) 04/21/2023 0841   CREATININE 1.89 (H) 04/21/2023 0841      Component Value Date/Time   CALCIUM  10.4 (H) 04/21/2023 0841   ALKPHOS 84 04/21/2023 0841   AST 28 04/21/2023 0841   ALT 26 04/21/2023 0841   BILITOT 0.5 04/21/2023 0841       Impression and Plan: [name] is a very pleasant 73 yo African American female with essential thrombocythemia, JAK 2 + and CALR +.  Again, her platelet count is higher.  We will have her take the Hydrea  daily now.  That should be able to bring her platelet count back down.  Hopefully, should be to hydrate little  bit better.  Her renal function is down a little bit.  I would like to see her back in 6 weeks.  I want to make sure that we follow her a bit more closely since we are changing the Hydrea  dose.   Maude JONELLE Crease, MD 1/10/20259:21 AM

## 2023-05-23 ENCOUNTER — Other Ambulatory Visit (HOSPITAL_COMMUNITY): Payer: Self-pay

## 2023-05-23 DIAGNOSIS — I5022 Chronic systolic (congestive) heart failure: Secondary | ICD-10-CM

## 2023-05-23 MED ORDER — FUROSEMIDE 40 MG PO TABS: 40.0000 mg | ORAL_TABLET | ORAL | 0 refills | Status: DC

## 2023-05-24 ENCOUNTER — Other Ambulatory Visit (HOSPITAL_COMMUNITY): Payer: Self-pay | Admitting: *Deleted

## 2023-05-24 DIAGNOSIS — I5022 Chronic systolic (congestive) heart failure: Secondary | ICD-10-CM

## 2023-05-24 MED ORDER — FUROSEMIDE 40 MG PO TABS
40.0000 mg | ORAL_TABLET | ORAL | 0 refills | Status: DC
Start: 2023-05-25 — End: 2023-09-29

## 2023-06-02 ENCOUNTER — Other Ambulatory Visit: Payer: Self-pay

## 2023-06-02 ENCOUNTER — Inpatient Hospital Stay: Payer: Medicare Other | Admitting: Hematology & Oncology

## 2023-06-02 ENCOUNTER — Inpatient Hospital Stay: Payer: Medicare Other | Attending: Hematology & Oncology

## 2023-06-02 ENCOUNTER — Encounter: Payer: Self-pay | Admitting: Hematology & Oncology

## 2023-06-02 VITALS — BP 113/64 | HR 65 | Temp 98.1°F | Resp 16 | Ht 59.0 in | Wt 147.0 lb

## 2023-06-02 DIAGNOSIS — D500 Iron deficiency anemia secondary to blood loss (chronic): Secondary | ICD-10-CM

## 2023-06-02 DIAGNOSIS — D473 Essential (hemorrhagic) thrombocythemia: Secondary | ICD-10-CM

## 2023-06-02 LAB — CMP (CANCER CENTER ONLY)
ALT: 19 U/L (ref 0–44)
AST: 24 U/L (ref 15–41)
Albumin: 4.3 g/dL (ref 3.5–5.0)
Alkaline Phosphatase: 69 U/L (ref 38–126)
Anion gap: 7 (ref 5–15)
BUN: 34 mg/dL — ABNORMAL HIGH (ref 8–23)
CO2: 30 mmol/L (ref 22–32)
Calcium: 11 mg/dL — ABNORMAL HIGH (ref 8.9–10.3)
Chloride: 102 mmol/L (ref 98–111)
Creatinine: 1.62 mg/dL — ABNORMAL HIGH (ref 0.44–1.00)
GFR, Estimated: 33 mL/min — ABNORMAL LOW (ref >60)
Glucose, Bld: 103 mg/dL — ABNORMAL HIGH (ref 70–99)
Potassium: 4.6 mmol/L (ref 3.5–5.1)
Sodium: 139 mmol/L (ref 135–145)
Total Bilirubin: 0.6 mg/dL (ref 0.0–1.2)
Total Protein: 6.9 g/dL (ref 6.5–8.1)

## 2023-06-02 LAB — SAVE SMEAR(SSMR), FOR PROVIDER SLIDE REVIEW

## 2023-06-02 LAB — CBC WITH DIFFERENTIAL (CANCER CENTER ONLY)
Abs Immature Granulocytes: 0.03 10*3/uL (ref 0.00–0.07)
Basophils Absolute: 0.1 10*3/uL (ref 0.0–0.1)
Basophils Relative: 1 %
Eosinophils Absolute: 0.2 10*3/uL (ref 0.0–0.5)
Eosinophils Relative: 6 %
HCT: 42.9 % (ref 36.0–46.0)
Hemoglobin: 13.7 g/dL (ref 12.0–15.0)
Immature Granulocytes: 1 %
Lymphocytes Relative: 23 %
Lymphs Abs: 0.9 10*3/uL (ref 0.7–4.0)
MCH: 31 pg (ref 26.0–34.0)
MCHC: 31.9 g/dL (ref 30.0–36.0)
MCV: 97.1 fL (ref 80.0–100.0)
Monocytes Absolute: 0.5 10*3/uL (ref 0.1–1.0)
Monocytes Relative: 14 %
Neutro Abs: 2.1 10*3/uL (ref 1.7–7.7)
Neutrophils Relative %: 55 %
Platelet Count: 516 10*3/uL — ABNORMAL HIGH (ref 150–400)
RBC: 4.42 MIL/uL (ref 3.87–5.11)
RDW: 15.9 % — ABNORMAL HIGH (ref 11.5–15.5)
WBC Count: 3.8 10*3/uL — ABNORMAL LOW (ref 4.0–10.5)
nRBC: 0 % (ref 0.0–0.2)

## 2023-06-02 LAB — IRON AND IRON BINDING CAPACITY (CC-WL,HP ONLY)
Iron: 62 ug/dL (ref 28–170)
Saturation Ratios: 16 % (ref 10.4–31.8)
TIBC: 386 ug/dL (ref 250–450)
UIBC: 324 ug/dL (ref 148–442)

## 2023-06-02 LAB — FERRITIN: Ferritin: 55 ng/mL (ref 11–307)

## 2023-06-02 LAB — LACTATE DEHYDROGENASE: LDH: 204 U/L — ABNORMAL HIGH (ref 98–192)

## 2023-06-02 NOTE — Progress Notes (Signed)
 Hematology and Oncology Follow Up Visit  Tina Jenkins [id number] [birth year] 73 y.o. 06/02/2023   Principle Diagnosis:  Essential thrombocythemia-  JAK2+/CALR+   Current Therapy:        Hydrea 500 mg p.o. daily-changed on 04/21/2023  - started on 10/02/2021   Interim History:  [name] is here today for follow-up.  We saw her back in January.  At that time, we increased her Hydrea to daily dosing.  Her platelet counts, now.  Happy about this.  She is doing well.  She had no problems so far.  There is no nausea or vomiting.  There is been no change in bowel or bladder habits.  She has had no rashes.  There is been no bleeding.  She has had no leg swelling.  She has had no fever.  There is been no cough or shortness of breath.   When we last saw her, her ferritin was 84 with an iron saturation of 27%.  She does see nephrology.  We will make sure that they get a copy of her labs.  Currently, I would say that her performance status is probably ECOG 1.     Medications:  Allergies as of 06/02/2023   No Known Allergies      Medication List        Accurate as of June 02, 2023  9:12 AM. If you have any questions, ask your nurse or doctor.          aspirin EC 81 MG tablet Take 81 mg by mouth daily.   atorvastatin 20 MG tablet Commonly known as: LIPITOR Take 20 mg by mouth at bedtime.   CALCIUM 1200 PO Take 1,200 mg by mouth 2 (two) times daily.   carvedilol 6.25 MG tablet Commonly known as: COREG TAKE 1 TABLET(6.25 MG) BY MOUTH TWICE DAILY   diphenhydramine-acetaminophen 25-500 MG Tabs tablet Commonly known as: TYLENOL PM Take 1 tablet by mouth at bedtime as needed.   Farxiga 10 MG Tabs tablet Generic drug: dapagliflozin propanediol Take 1 tablet (10 mg total) by mouth daily before breakfast.   furosemide 40 MG tablet Commonly known as: LASIX Take 1 tablet (40 mg total) by mouth 2 (two) times a week. Monday and Friday   hydroxyurea 500 MG capsule Commonly  known as: HYDREA TAKE 1 CAPSULE(500 MG) BY MOUTH DAILY. MAY TAKE WITH FOOD TO MINIMIZE GI SIDE EFFECTS What changed: See the new instructions.   Magnesium 200 MG Tabs Take 1 tablet by mouth 2 (two) times daily.   multivitamin with minerals Tabs tablet Take 1 tablet by mouth daily.   spironolactone 25 MG tablet Commonly known as: ALDACTONE Take 1 tablet (25 mg total) by mouth daily.   traZODone 50 MG tablet Commonly known as: DESYREL Take 50 mg by mouth at bedtime as needed.   Turmeric 500 MG Caps Take 1 capsule by mouth 2 (two) times daily.        Allergies: No Known Allergies  Past Medical History, Surgical history, Social history, and Family History were reviewed and updated.  Review of Systems: Review of Systems  Constitutional: Negative.   HENT: Negative.    Eyes: Negative.   Respiratory: Negative.    Cardiovascular: Negative.   Gastrointestinal: Negative.   Genitourinary: Negative.   Musculoskeletal: Negative.   Skin: Negative.   Neurological: Negative.   Endo/Heme/Allergies: Negative.   Psychiatric/Behavioral: Negative.        Physical Exam: Vital signs show a temperature of 98.1.  Pulse 65.  Blood pressure 113/64.  Weight is 147 pounds.    Wt Readings from Last 3 Encounters:  06/02/23 147 lb (66.7 kg)  04/21/23 149 lb 6.4 oz (67.8 kg)  04/03/23 146 lb (66.2 kg)   Physical Exam Vitals reviewed.  HENT:     Head: Normocephalic and atraumatic.  Eyes:     Pupils: Pupils are equal, round, and reactive to light.  Cardiovascular:     Rate and Rhythm: Normal rate and regular rhythm.     Heart sounds: Normal heart sounds.  Pulmonary:     Effort: Pulmonary effort is normal.     Breath sounds: Normal breath sounds.  Abdominal:     General: Bowel sounds are normal.     Palpations: Abdomen is soft.  Musculoskeletal:        General: No tenderness or deformity. Normal range of motion.     Cervical back: Normal range of motion.  Lymphadenopathy:      Cervical: No cervical adenopathy.  Skin:    General: Skin is warm and dry.     Findings: No erythema or rash.  Neurological:     Mental Status: She is alert and oriented to person, place, and time.  Psychiatric:        Behavior: Behavior normal.        Thought Content: Thought content normal.        Judgment: Judgment normal.     Lab Results  Component Value Date   WBC 3.8 (L) 06/02/2023   HGB 13.7 06/02/2023   HCT 42.9 06/02/2023   MCV 97.1 06/02/2023   PLT 516 (H) 06/02/2023   Lab Results  Component Value Date   FERRITIN 84 04/21/2023   IRON 103 04/21/2023   TIBC 377 04/21/2023   UIBC 274 04/21/2023   IRONPCTSAT 27 04/21/2023   Lab Results  Component Value Date   RETICCTPCT 1.3 04/21/2023   RBC 4.42 06/02/2023   No results found for: "KPAFRELGTCHN", "LAMBDASER", "KAPLAMBRATIO" No results found for: "IGGSERUM", "IGA", "IGMSERUM" No results found for: "TOTALPROTELP", "ALBUMINELP", "A1GS", "A2GS", "BETS", "BETA2SER", "GAMS", "MSPIKE", "SPEI"   Chemistry      Component Value Date/Time   NA 139 06/02/2023 0820   K 4.6 06/02/2023 0820   CL 102 06/02/2023 0820   CO2 30 06/02/2023 0820   BUN 34 (H) 06/02/2023 0820   CREATININE 1.62 (H) 06/02/2023 0820      Component Value Date/Time   CALCIUM 11.0 (H) 06/02/2023 0820   ALKPHOS 69 06/02/2023 0820   AST 24 06/02/2023 0820   ALT 19 06/02/2023 0820   BILITOT 0.6 06/02/2023 0820       Impression and Plan: [name] is a very pleasant 73 yo African American female with essential thrombocythemia, JAK 2 + and CALR +.  Again, her platelet count is much better now with the daily Hydrea dose.  We will continue her on the daily Hydrea dose.  I think we can try to move the appointment out now maybe 2 months.  We will get her back in the Spring when it is a little warmer.  It sounds like she and her husband had a very nice Valentine's Day.    Josph Macho, MD 2/21/20259:12 AM

## 2023-06-09 ENCOUNTER — Other Ambulatory Visit: Payer: Self-pay | Admitting: *Deleted

## 2023-06-09 DIAGNOSIS — I5022 Chronic systolic (congestive) heart failure: Secondary | ICD-10-CM

## 2023-07-12 ENCOUNTER — Telehealth (HOSPITAL_COMMUNITY): Payer: Self-pay | Admitting: Internal Medicine

## 2023-07-12 NOTE — Telephone Encounter (Signed)
 Called to confirm/remind patient of their appointment at [facility] on 07/12/23.   Appointment:   [] Confirmed  [x] Left mess   [] No answer/No voice mail  [] Phone not in service  Patient reminded to bring all medications and/or complete list.  Confirmed patient has transportation. Gave directions, instructed to utilize valet parking.

## 2023-07-13 ENCOUNTER — Ambulatory Visit (HOSPITAL_BASED_OUTPATIENT_CLINIC_OR_DEPARTMENT_OTHER)
Admission: RE | Admit: 2023-07-13 | Discharge: 2023-07-13 | Disposition: A | Discharge status: Home / Self Care | Payer: Medicare Other | Source: Ambulatory Visit | Attending: Internal Medicine | Admitting: Internal Medicine

## 2023-07-13 ENCOUNTER — Ambulatory Visit (HOSPITAL_COMMUNITY)
Admission: RE | Admit: 2023-07-13 | Discharge: 2023-07-13 | Disposition: A | Discharge status: Home / Self Care | Payer: Medicare Other | Source: Ambulatory Visit | Attending: Internal Medicine | Admitting: Internal Medicine

## 2023-07-13 ENCOUNTER — Encounter (HOSPITAL_COMMUNITY): Payer: Self-pay | Admitting: Internal Medicine

## 2023-07-13 VITALS — BP 124/70 | HR 62 | Ht 59.0 in | Wt 145.6 lb

## 2023-07-13 DIAGNOSIS — N1832 Chronic kidney disease, stage 3b: Secondary | ICD-10-CM | POA: Diagnosis not present

## 2023-07-13 DIAGNOSIS — I10 Essential (primary) hypertension: Secondary | ICD-10-CM | POA: Diagnosis not present

## 2023-07-13 DIAGNOSIS — N1831 Chronic kidney disease, stage 3a: Secondary | ICD-10-CM | POA: Diagnosis not present

## 2023-07-13 DIAGNOSIS — I130 Hypertensive heart and chronic kidney disease with heart failure and stage 1 through stage 4 chronic kidney disease, or unspecified chronic kidney disease: Secondary | ICD-10-CM | POA: Insufficient documentation

## 2023-07-13 DIAGNOSIS — N184 Chronic kidney disease, stage 4 (severe): Secondary | ICD-10-CM

## 2023-07-13 DIAGNOSIS — E785 Hyperlipidemia, unspecified: Secondary | ICD-10-CM | POA: Insufficient documentation

## 2023-07-13 DIAGNOSIS — Z7984 Long term (current) use of oral hypoglycemic drugs: Secondary | ICD-10-CM | POA: Insufficient documentation

## 2023-07-13 DIAGNOSIS — Z79899 Other long term (current) drug therapy: Secondary | ICD-10-CM | POA: Diagnosis not present

## 2023-07-13 DIAGNOSIS — I5022 Chronic systolic (congestive) heart failure: Secondary | ICD-10-CM

## 2023-07-13 DIAGNOSIS — I080 Rheumatic disorders of both mitral and aortic valves: Secondary | ICD-10-CM | POA: Insufficient documentation

## 2023-07-13 DIAGNOSIS — I2510 Atherosclerotic heart disease of native coronary artery without angina pectoris: Secondary | ICD-10-CM | POA: Diagnosis not present

## 2023-07-13 DIAGNOSIS — I493 Ventricular premature depolarization: Secondary | ICD-10-CM | POA: Insufficient documentation

## 2023-07-13 DIAGNOSIS — Z8249 Family history of ischemic heart disease and other diseases of the circulatory system: Secondary | ICD-10-CM | POA: Diagnosis not present

## 2023-07-13 DIAGNOSIS — D75839 Thrombocytosis, unspecified: Secondary | ICD-10-CM | POA: Diagnosis not present

## 2023-07-13 DIAGNOSIS — I428 Other cardiomyopathies: Secondary | ICD-10-CM | POA: Diagnosis not present

## 2023-07-13 LAB — ECHOCARDIOGRAM COMPLETE
AR max vel: 1.94 cm2
AV Area VTI: 2.1 cm2
AV Area mean vel: 1.88 cm2
AV Mean grad: 3 mmHg
AV Peak grad: 4.8 mmHg
Ao pk vel: 1.1 m/s
Area-P 1/2: 2.45 cm2
MV VTI: 1.86 cm2
S' Lateral: 2.5 cm

## 2023-07-13 NOTE — Addendum Note (Signed)
 Encounter addended by: Noralee Space, RN on: 07/13/2023 10:36 AM  Actions taken: Clinical Note Signed, Order list changed, Diagnosis association updated

## 2023-07-13 NOTE — Patient Instructions (Signed)
 Great to see you today!!!  Your physician recommends that you schedule a follow-up appointment in: 9 months with an echocardiogram (Jan 2026)

## 2023-07-13 NOTE — Progress Notes (Signed)
 [facility] NOTE  PCP: Ollen Bowl, MD Primary Cardiologist: Dr. Shari Prows  HF Cardiologist: Dr. Gala Romney Hematology:Dr Ennever    HPI: Tina Jenkins is a 73 y.o. AAF w/ h/o P.vera (JAK 2+), HTN, HLD, CKD IIIa, and chronic systolic heart failure/ NICM.    Has family h/o CHF and SCD. Her brother died at the age of 50 while running/playing. Her mother said he had a "bad heart". Her father also had CHF but older in life and lived until his 10s.    Admitted 4/23 with new acute CHF. Had URI 2 weeks prior, COVID negative at that time. Diuresed w/ IV Lasix. Echo showed reduced LVEF, 35-40%, RV normal. Hs trop elevated not c/w acute myocarditis, 15>>19. Underwent R/LHC showing mild nonobstructive CAD, normal filling pressures and normal cardiac output.  GDMT initiated, referred to [facility], and discharged home with weight 146 lbs.  cMRI (6/23 LVEF 36%, RVEF 44%, no LGE.  Echo 10/2021: EF 35-40% RV normal.   Echo 3/24  EF 55-60% mild LVH G1DD.   Echo today 07/13/23 EF 50-55% GLS -16.4 Personally reviewed  Here with here husband for f/u. Does 30 mins on elliptical and active in the yard and with housework. No SOB,edema, orthopnea or PND. Compliant with meds. Taking BP at home 96-110/70-80s. Takes lasix 2x/week. BP low after taking lasix    Uses elliptical 2-3x over 30 minutes. No CP or undue SOB. No edema. Complaint with meds. SBP 110-120s at home. Minimal snoring   Zio 12/23 1. Sinus rhythm - avg HR of 83 bpm.  2. One run of  nonsustained Ventricular Tachycardia occurred lasting 8 beats with a max rate of 176 bpm (avg 154 bpm). 3. Isolated PVCs were occasional (2.9%, 46030) 4. Ventricular Bigeminy and Trigeminy were present.   Cardiac Testing  - cMRI (6/23): LVEF 36% moderate HK, RVEF 44%, no LGE  - Echo 10/2021: EF 35-40% RV normal. - Echo (4/23): EF 35-40%, LV moderately decreased with global HK, moderate LVH, RV ok, mild to moderate MR  - R/LHC (4/23): CO/CI (Fick) 4.51/2.78  L/min PA: 30/9 (mean 17) mmHg RA A: 3 mmHg RA V:1 mmHg RA mean (0) RVSP: 26 mmgHg LVEDP: 3 mmHg  25% distal left main. 25% ostial LAD. 25% ostial circumflex. Normal dominant right coronary. Normal right heart pressures without pulmonary hypertension.  LVEDP 3 mmHg and mean wedge pressure 4 mmHg. Global left ventricular hypokinesis consistent with systolic heart failure, compensated. Etiology uncertain but illness was preceded by an upper respiratory infection and therefore could be postviral myocarditis. Father died of heart failure and therefore genetic predisposition is a consideration as well. Quadruple therapy with hopeful full recovery of LV function.   ROS: All systems reviewed and negative except as per HPI.   Past Medical History:  Diagnosis Date   Chronic kidney disease    kidney stones   Essential thrombocythemia (HCC) 10/01/2021   Hypercholesteremia    Hypertension    Nonischemic cardiomyopathy (HCC)    Systolic heart failure (HCC)    Current Outpatient Medications  Medication Sig Dispense Refill   aspirin EC 81 MG tablet Take 81 mg by mouth daily.     atorvastatin (LIPITOR) 20 MG tablet Take 20 mg by mouth at bedtime.      Calcium Carbonate-Vit D-Min (CALCIUM 1200 PO) Take 1,200 mg by mouth 2 (two) times daily.     carvedilol (COREG) 6.25 MG tablet TAKE 1 TABLET(6.25 MG) BY MOUTH TWICE DAILY 180 tablet 3   dapagliflozin  propanediol (FARXIGA) 10 MG TABS tablet Take 1 tablet (10 mg total) by mouth daily before breakfast. 30 tablet 11   diphenhydramine-acetaminophen (TYLENOL PM) 25-500 MG TABS tablet Take 1 tablet by mouth at bedtime as needed.     furosemide (LASIX) 40 MG tablet Take 1 tablet (40 mg total) by mouth 2 (two) times a week. Monday and Friday 15 tablet 0   hydroxyurea (HYDREA) 500 MG capsule TAKE 1 CAPSULE(500 MG) BY MOUTH DAILY. MAY TAKE WITH FOOD TO MINIMIZE GI SIDE EFFECTS 30 capsule 6   Magnesium 200 MG TABS Take 1 tablet by mouth 2 (two) times daily.      Multiple Vitamin (MULTIVITAMIN WITH MINERALS) TABS Take 1 tablet by mouth daily.     spironolactone (ALDACTONE) 25 MG tablet Take 1 tablet (25 mg total) by mouth daily. 90 tablet 3   traZODone (DESYREL) 50 MG tablet Take 50 mg by mouth at bedtime as needed.     Turmeric 500 MG CAPS Take 1 capsule by mouth 2 (two) times daily.     No current facility-administered medications for this encounter.   No Known Allergies  Social History   Socioeconomic History   Marital status: Married    Spouse name: Systems analyst   Number of children: 1   Years of education: Not on file   Highest education level: High school graduate  Occupational History   Occupation: Retired    Comment: [employer]  Tobacco Use   Smoking status: Never   Smokeless tobacco: Not on file  Vaping Use   Vaping status: Never Used  Substance and Sexual Activity   Alcohol use: No   Drug use: No   Sexual activity: Not on file  Other Topics Concern   Not on file  Social History Narrative   Not on file   Social Drivers of Health   Financial Resource Strain: Low Risk  (07/26/2021)   Overall Financial Resource Strain (CARDIA)    Difficulty of Paying Living Expenses: Not hard at all  Food Insecurity: No Food Insecurity (07/26/2021)   Hunger Vital Sign    Worried About Running Out of Food in the Last Year: Never true    Ran Out of Food in the Last Year: Never true  Transportation Needs: No Transportation Needs (07/26/2021)   PRAPARE - Administrator, Civil Service (Medical): No    Lack of Transportation (Non-Medical): No  Physical Activity: Not on file  Stress: Not on file  Social Connections: Not on file  Intimate Partner Violence: Not on file   Family History  Problem Relation Age of Onset   Heart failure Father    Sudden Cardiac Death Brother 12   BP 124/70   Pulse 62   Ht 4\' 11"  (1.499 m)   Wt 66 kg (145 lb 9.6 oz)   SpO2 96%   BMI 29.41 kg/m   Wt Readings from Last 3 Encounters:  07/13/23 66 kg (145  lb 9.6 oz)  06/02/23 66.7 kg (147 lb)  04/21/23 67.8 kg (149 lb 6.4 oz)   PHYSICAL EXAM: General:  Well appearing. No resp difficulty HEENT: normal Neck: supple. no JVD. Carotids 2+ bilat; no bruits. No lymphadenopathy or thryomegaly appreciated. Cor: PMI nondisplaced. Regular rate & rhythm. No rubs, gallops or murmurs. Lungs: clear Abdomen: soft, nontender, nondistended. No hepatosplenomegaly. No bruits or masses. Good bowel sounds. Extremities: no cyanosis, clubbing, rash, edema Neuro: alert & orientedx3, cranial nerves grossly intact. moves all 4 extremities w/o difficulty. Affect pleasant  ASSESSMENT & PLAN: Chronic Systolic Heart Failure - NICM. - Echo (4/23): EF 35-40%, RV normal - L/RHC (4/23): w/ mild obstructive CAD, normal filling pressures and normal CO  - Etiology uncertain, but acute CHF exacerbation was preceded by an upper respiratory infection and therefore could be postviral myocarditis. Doubt acute given Hs trop trend but did have COVID 10/22.  - Also ? Familial CM given family history. Father died from HF.  - cMRI 2023-10-13) LVEF 36%, RVEF 44%, no LGE. - Echo 7/23  EF 35-40% RV normal . Out of range for ICD - Echo 06/17/22 EF 55-60% mild LVH G1DD - Echo today 07/13/23 EF 50-55% GLS -16.4 Personally reviewed - NYHA I Volume ok Currently on lasix M and F.  - Continue Coreg 6.25 mg bid. - Off entresto with low BP.  BP too low to add ARB - Continue Farxiga 10 mg daily.  - Continue spironolactone 25 mg daily.  - Zio 12/23 2.7% PVCs - Recent labs ok. Reviewed personally  - Repeat echo in 9 months to ensure EF stability  2. Hypertension  -  BP runs low   3. Stage IIIb-IV CKD  - Continue Farxiga 10 mg daily.  - Creatinine today 1.5- 1.8 - last check 1.45 on 04/18/22 - Follows with Dr. Valentino Nose in [facility]  - Ideally would like to add ARB but BP won't permit  4. 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 Follows with Heme/Onc   5. PVCs - Zio 12/23 2.7% PVCs - Denies significant  snoring apnea   Arvilla Meres, MD 10:20 AM

## 2023-07-21 ENCOUNTER — Other Ambulatory Visit: Payer: Self-pay | Admitting: Hematology & Oncology

## 2023-07-28 ENCOUNTER — Encounter: Payer: Self-pay | Admitting: Hematology & Oncology

## 2023-07-28 ENCOUNTER — Inpatient Hospital Stay: Payer: Medicare Other | Admitting: Hematology & Oncology

## 2023-07-28 ENCOUNTER — Inpatient Hospital Stay: Payer: Medicare Other | Attending: Hematology & Oncology

## 2023-07-28 ENCOUNTER — Encounter: Payer: Self-pay | Admitting: *Deleted

## 2023-07-28 ENCOUNTER — Other Ambulatory Visit: Payer: Self-pay

## 2023-07-28 VITALS — BP 113/77 | HR 61 | Temp 98.3°F | Resp 16 | Ht 59.0 in | Wt 146.0 lb

## 2023-07-28 DIAGNOSIS — D473 Essential (hemorrhagic) thrombocythemia: Secondary | ICD-10-CM | POA: Diagnosis not present

## 2023-07-28 LAB — CMP (CANCER CENTER ONLY)
ALT: 18 U/L (ref 0–44)
AST: 26 U/L (ref 15–41)
Albumin: 4.4 g/dL (ref 3.5–5.0)
Alkaline Phosphatase: 70 U/L (ref 38–126)
Anion gap: 8 (ref 5–15)
BUN: 41 mg/dL — ABNORMAL HIGH (ref 8–23)
CO2: 26 mmol/L (ref 22–32)
Calcium: 9.9 mg/dL (ref 8.9–10.3)
Chloride: 107 mmol/L (ref 98–111)
Creatinine: 1.75 mg/dL — ABNORMAL HIGH (ref 0.44–1.00)
GFR, Estimated: 30 mL/min — ABNORMAL LOW (ref >60)
Glucose, Bld: 107 mg/dL — ABNORMAL HIGH (ref 70–99)
Potassium: 5.2 mmol/L — ABNORMAL HIGH (ref 3.5–5.1)
Sodium: 141 mmol/L (ref 135–145)
Total Bilirubin: 0.5 mg/dL (ref 0.0–1.2)
Total Protein: 6.9 g/dL (ref 6.5–8.1)

## 2023-07-28 LAB — CBC WITH DIFFERENTIAL (CANCER CENTER ONLY)
Abs Immature Granulocytes: 0.03 10*3/uL (ref 0.00–0.07)
Basophils Absolute: 0 10*3/uL (ref 0.0–0.1)
Basophils Relative: 1 %
Eosinophils Absolute: 0.2 10*3/uL (ref 0.0–0.5)
Eosinophils Relative: 5 %
HCT: 44.4 % (ref 36.0–46.0)
Hemoglobin: 14 g/dL (ref 12.0–15.0)
Immature Granulocytes: 1 %
Lymphocytes Relative: 26 %
Lymphs Abs: 1.1 10*3/uL (ref 0.7–4.0)
MCH: 32 pg (ref 26.0–34.0)
MCHC: 31.5 g/dL (ref 30.0–36.0)
MCV: 101.6 fL — ABNORMAL HIGH (ref 80.0–100.0)
Monocytes Absolute: 0.6 10*3/uL (ref 0.1–1.0)
Monocytes Relative: 14 %
Neutro Abs: 2.4 10*3/uL (ref 1.7–7.7)
Neutrophils Relative %: 53 %
Platelet Count: 513 10*3/uL — ABNORMAL HIGH (ref 150–400)
RBC: 4.37 MIL/uL (ref 3.87–5.11)
RDW: 15.7 % — ABNORMAL HIGH (ref 11.5–15.5)
WBC Count: 4.5 10*3/uL (ref 4.0–10.5)
nRBC: 0 % (ref 0.0–0.2)

## 2023-07-28 LAB — IRON AND IRON BINDING CAPACITY (CC-WL,HP ONLY)
Iron: 60 ug/dL (ref 28–170)
Saturation Ratios: 16 % (ref 10.4–31.8)
TIBC: 377 ug/dL (ref 250–450)
UIBC: 317 ug/dL (ref 148–442)

## 2023-07-28 LAB — FERRITIN: Ferritin: 57 ng/mL (ref 11–307)

## 2023-07-28 LAB — SAVE SMEAR(SSMR), FOR PROVIDER SLIDE REVIEW

## 2023-07-28 NOTE — Progress Notes (Signed)
 Hematology and Oncology Follow Up Visit  Tina Jenkins [id number] 1951-04-07 73 y.o. 07/28/2023   Principle Diagnosis:  Essential thrombocythemia-  JAK2+/CALR+   Current Therapy:        Hydrea  500 mg p.o. daily-changed on 04/21/2023  - started on 10/02/2021   Interim History:  Tina Jenkins is here today for follow-up.  We last saw her 2 months ago.  Since then, she has been doing quite well.  She has had no problems with fever.  She has a little bit of fatigue.  The Hydrea  does make her feel a little tired.  She has had no bleeding.  She has had no nausea or vomiting.  She has had no cough or shortness of breath.  She has had no rashes.  There is been no change in bowel or bladder habits.  She and her husband will have a wonderful Easter weekend.  I know that their church has advanced plans.  Her last iron studies showed a ferritin of 55 with an iron saturation of 16%.  We will have to watch this closely.  Currently, I would say performance status is probably ECOG 1.   Medications:  Allergies as of 07/28/2023   No Known Allergies      Medication List        Accurate as of July 28, 2023  8:36 AM. If you have any questions, ask your nurse or doctor.          aspirin  EC 81 MG tablet Take 81 mg by mouth daily.   atorvastatin  20 MG tablet Commonly known as: LIPITOR Take 20 mg by mouth at bedtime.   benazepril-hydrochlorthiazide 20-12.5 MG tablet Commonly known as: LOTENSIN HCT Take 1 tablet by mouth daily.   CALCIUM  1200 PO Take 1,200 mg by mouth 2 (two) times daily.   carvedilol  6.25 MG tablet Commonly known as: COREG  TAKE 1 TABLET(6.25 MG) BY MOUTH TWICE DAILY   diphenhydramine -acetaminophen  25-500 MG Tabs tablet Commonly known as: TYLENOL  PM Take 1 tablet by mouth at bedtime as needed.   Farxiga  10 MG Tabs tablet Generic drug: dapagliflozin  propanediol Take 1 tablet (10 mg total) by mouth daily before breakfast.   furosemide  40 MG tablet Commonly known as:  LASIX  Take 1 tablet (40 mg total) by mouth 2 (two) times a week. Monday and Friday   hydroxyurea  500 MG capsule Commonly known as: HYDREA  TAKE 1 CAPSULE(500 MG) BY MOUTH DAILY. MAY TAKE WITH FOOD TO MINIMIZE GI SIDE EFFECTS   Magnesium 200 MG Tabs Take 1 tablet by mouth 2 (two) times daily.   multivitamin with minerals Tabs tablet Take 1 tablet by mouth daily.   spironolactone  25 MG tablet Commonly known as: ALDACTONE  Take 1 tablet (25 mg total) by mouth daily.   traZODone 50 MG tablet Commonly known as: DESYREL Take 50 mg by mouth at bedtime as needed.   Turmeric 500 MG Caps Take 1 capsule by mouth 2 (two) times daily.   Voltaren 1 % Gel Generic drug: diclofenac Sodium Apply 2 g topically 4 (four) times daily.        Allergies: No Known Allergies  Past Medical History, Surgical history, Social history, and Family History were reviewed and updated.  Review of Systems: Review of Systems  Constitutional: Negative.   HENT: Negative.    Eyes: Negative.   Respiratory: Negative.    Cardiovascular: Negative.   Gastrointestinal: Negative.   Genitourinary: Negative.   Musculoskeletal: Negative.   Skin: Negative.   Neurological: Negative.  Endo/Heme/Allergies: Negative.   Psychiatric/Behavioral: Negative.        Physical Exam: Vital signs show a temperature of 98.3.  Pulse 61.  Blood pressure 113/77.  Weight is 146 pounds.     Wt Readings from Last 3 Encounters:  07/28/23 146 lb (66.2 kg)  07/13/23 145 lb 9.6 oz (66 kg)  06/02/23 147 lb (66.7 kg)   Physical Exam Vitals reviewed.  HENT:     Head: Normocephalic and atraumatic.  Eyes:     Pupils: Pupils are equal, round, and reactive to light.  Cardiovascular:     Rate and Rhythm: Normal rate and regular rhythm.     Heart sounds: Normal heart sounds.  Pulmonary:     Effort: Pulmonary effort is normal.     Breath sounds: Normal breath sounds.  Abdominal:     General: Bowel sounds are normal.      Palpations: Abdomen is soft.  Musculoskeletal:        General: No tenderness or deformity. Normal range of motion.     Cervical back: Normal range of motion.  Lymphadenopathy:     Cervical: No cervical adenopathy.  Skin:    General: Skin is warm and dry.     Findings: No erythema or rash.  Neurological:     Mental Status: She is alert and oriented to person, place, and time.  Psychiatric:        Behavior: Behavior normal.        Thought Content: Thought content normal.        Judgment: Judgment normal.     Lab Results  Component Value Date   WBC 4.5 07/28/2023   HGB 14.0 07/28/2023   HCT 44.4 07/28/2023   MCV 101.6 (H) 07/28/2023   PLT 513 (H) 07/28/2023   Lab Results  Component Value Date   FERRITIN 55 06/02/2023   IRON 62 06/02/2023   TIBC 386 06/02/2023   UIBC 324 06/02/2023   IRONPCTSAT 16 06/02/2023   Lab Results  Component Value Date   RETICCTPCT 1.3 04/21/2023   RBC 4.37 07/28/2023   No results found for: KPAFRELGTCHN, LAMBDASER, KAPLAMBRATIO No results found for: IGGSERUM, IGA, IGMSERUM No results found for: STEPHANY CARLOTA BENSON MARKEL EARLA JOANNIE DOC VICK, SPEI   Chemistry      Component Value Date/Time   NA 139 06/02/2023 0820   K 4.6 06/02/2023 0820   CL 102 06/02/2023 0820   CO2 30 06/02/2023 0820   BUN 34 (H) 06/02/2023 0820   CREATININE 1.62 (H) 06/02/2023 0820      Component Value Date/Time   CALCIUM  11.0 (H) 06/02/2023 0820   ALKPHOS 69 06/02/2023 0820   AST 24 06/02/2023 0820   ALT 19 06/02/2023 0820   BILITOT 0.6 06/02/2023 0820       Impression and Plan: Tina Jenkins is a very pleasant 73 yo African American female with essential thrombocythemia, JAK 2 + and CALR +.  Of note, she had a wonderful birthday last week.  She went to dinner a couple of times.  She really enjoyed this.  Her platelet count is stable.  We will continue to keep the Hydrea  at the same dose.  We will see what her  iron levels look like.  Is possible she may be iron deficient.  If so, we will see about giving her some IV iron and see if this may help the platelet count.  I think we can try move her appointment out to 3 months now.  We  will try to get her into the Summer.     Maude JONELLE Crease, MD 4/18/20258:36 AM

## 2023-08-02 ENCOUNTER — Other Ambulatory Visit (HOSPITAL_BASED_OUTPATIENT_CLINIC_OR_DEPARTMENT_OTHER): Payer: Self-pay

## 2023-08-29 ENCOUNTER — Other Ambulatory Visit (HOSPITAL_COMMUNITY): Payer: Self-pay | Admitting: Family Medicine

## 2023-08-31 ENCOUNTER — Other Ambulatory Visit (HOSPITAL_BASED_OUTPATIENT_CLINIC_OR_DEPARTMENT_OTHER): Payer: Self-pay

## 2023-08-31 MED ORDER — DAPAGLIFLOZIN PROPANEDIOL 10 MG PO TABS
10.0000 mg | ORAL_TABLET | Freq: Every day | ORAL | 11 refills | Status: AC
  Filled 2023-08-31: qty 30, 30d supply, fill #0
  Filled 2023-09-28: qty 30, 30d supply, fill #1
  Filled 2023-10-30: qty 30, 30d supply, fill #2
  Filled 2023-11-27: qty 30, 30d supply, fill #3
  Filled 2023-12-25: qty 30, 30d supply, fill #4
  Filled 2024-01-24: qty 30, 30d supply, fill #5
  Filled 2024-02-20: qty 30, 30d supply, fill #6
  Filled 2024-03-21: qty 30, 30d supply, fill #7
  Filled 2024-04-17 – 2024-04-19 (×2): qty 30, 30d supply, fill #8
  Filled 2024-05-16 – 2024-05-17 (×2): qty 30, 30d supply, fill #9

## 2023-09-28 ENCOUNTER — Other Ambulatory Visit (HOSPITAL_COMMUNITY): Payer: Self-pay | Admitting: Internal Medicine

## 2023-09-28 DIAGNOSIS — I5022 Chronic systolic (congestive) heart failure: Secondary | ICD-10-CM

## 2023-09-29 ENCOUNTER — Inpatient Hospital Stay: Attending: Hematology & Oncology

## 2023-09-29 ENCOUNTER — Inpatient Hospital Stay: Admitting: Hematology & Oncology

## 2023-09-29 ENCOUNTER — Other Ambulatory Visit: Payer: Self-pay

## 2023-09-29 VITALS — BP 112/75 | HR 70 | Temp 98.1°F | Resp 16 | Ht 59.0 in | Wt 145.0 lb

## 2023-09-29 DIAGNOSIS — D473 Essential (hemorrhagic) thrombocythemia: Secondary | ICD-10-CM

## 2023-09-29 DIAGNOSIS — I509 Heart failure, unspecified: Secondary | ICD-10-CM

## 2023-09-29 LAB — CBC WITH DIFFERENTIAL (CANCER CENTER ONLY)
Abs Immature Granulocytes: 0.02 10*3/uL (ref 0.00–0.07)
Basophils Absolute: 0 10*3/uL (ref 0.0–0.1)
Basophils Relative: 1 %
Eosinophils Absolute: 0.1 10*3/uL (ref 0.0–0.5)
Eosinophils Relative: 4 %
HCT: 43.9 % (ref 36.0–46.0)
Hemoglobin: 13.8 g/dL (ref 12.0–15.0)
Immature Granulocytes: 1 %
Lymphocytes Relative: 28 %
Lymphs Abs: 0.9 10*3/uL (ref 0.7–4.0)
MCH: 32.4 pg (ref 26.0–34.0)
MCHC: 31.4 g/dL (ref 30.0–36.0)
MCV: 103.1 fL — ABNORMAL HIGH (ref 80.0–100.0)
Monocytes Absolute: 0.5 10*3/uL (ref 0.1–1.0)
Monocytes Relative: 15 %
Neutro Abs: 1.7 10*3/uL (ref 1.7–7.7)
Neutrophils Relative %: 51 %
Platelet Count: 459 10*3/uL — ABNORMAL HIGH (ref 150–400)
RBC: 4.26 MIL/uL (ref 3.87–5.11)
RDW: 14 % (ref 11.5–15.5)
WBC Count: 3.2 10*3/uL — ABNORMAL LOW (ref 4.0–10.5)
nRBC: 0 % (ref 0.0–0.2)

## 2023-09-29 LAB — LACTATE DEHYDROGENASE: LDH: 204 U/L — ABNORMAL HIGH (ref 98–192)

## 2023-09-29 LAB — CMP (CANCER CENTER ONLY)
ALT: 17 U/L (ref 0–44)
AST: 26 U/L (ref 15–41)
Albumin: 4.2 g/dL (ref 3.5–5.0)
Alkaline Phosphatase: 78 U/L (ref 38–126)
Anion gap: 8 (ref 5–15)
BUN: 41 mg/dL — ABNORMAL HIGH (ref 8–23)
CO2: 31 mmol/L (ref 22–32)
Calcium: 10.6 mg/dL — ABNORMAL HIGH (ref 8.9–10.3)
Chloride: 102 mmol/L (ref 98–111)
Creatinine: 1.99 mg/dL — ABNORMAL HIGH (ref 0.44–1.00)
GFR, Estimated: 26 mL/min — ABNORMAL LOW (ref >60)
Glucose, Bld: 106 mg/dL — ABNORMAL HIGH (ref 70–99)
Potassium: 4.9 mmol/L (ref 3.5–5.1)
Sodium: 141 mmol/L (ref 135–145)
Total Bilirubin: 0.6 mg/dL (ref 0.0–1.2)
Total Protein: 6.7 g/dL (ref 6.5–8.1)

## 2023-09-29 LAB — RETICULOCYTES
Immature Retic Fract: 18.5 % — ABNORMAL HIGH (ref 2.3–15.9)
RBC.: 4.27 MIL/uL (ref 3.87–5.11)
Retic Count, Absolute: 67.5 10*3/uL (ref 19.0–186.0)
Retic Ct Pct: 1.6 % (ref 0.4–3.1)

## 2023-09-29 LAB — IRON AND IRON BINDING CAPACITY (CC-WL,HP ONLY)
Iron: 55 ug/dL (ref 28–170)
Saturation Ratios: 15 % (ref 10.4–31.8)
TIBC: 371 ug/dL (ref 250–450)
UIBC: 316 ug/dL (ref 148–442)

## 2023-09-29 LAB — FERRITIN: Ferritin: 80 ng/mL (ref 11–307)

## 2023-09-29 NOTE — Progress Notes (Signed)
 Hematology and Oncology Follow Up Visit  Tina Jenkins [id number] Mar 24, 1951 73 y.o. 09/29/2023   Principle Diagnosis:  Essential thrombocythemia-  JAK2+/CALR+   Current Therapy:        Hydrea  500 mg p.o. daily-changed on 04/21/2023  - started on 10/02/2021   Interim History:  Tina Jenkins is here today for follow-up.  She and her husband just got back from [place].  Had a wonderful time down there.  The other 53rd wedding anniversary.  She is doing well.  She has had no problems with the Hydrea .  She has had no problems with bleeding or bruising.  There is no cough or shortness of breath.  Her appetite has been quite good.  She is having some oral iron.  She is having no problems with the oral iron.  When we last saw her, her ferritin was 57 with iron saturation of 16%.  She has had no fever.  She had no problems with leg swelling.  There is been no rashes.  Overall, I will say that her performance status is probably ECOG 1.    Medications:  Allergies as of 09/29/2023   No Known Allergies      Medication List        Accurate as of September 29, 2023  8:20 AM. If you have any questions, ask your nurse or doctor.          STOP taking these medications    Voltaren 1 % Gel Generic drug: diclofenac Sodium Stopped by: Ivor Mars       TAKE these medications    aspirin  EC 81 MG tablet Take 81 mg by mouth daily.   atorvastatin  20 MG tablet Commonly known as: LIPITOR Take 20 mg by mouth at bedtime.   benazepril-hydrochlorthiazide 20-12.5 MG tablet Commonly known as: LOTENSIN HCT Take 1 tablet by mouth daily.   CALCIUM  1200 PO Take 1,200 mg by mouth 2 (two) times daily.   carvedilol  6.25 MG tablet Commonly known as: COREG  TAKE 1 TABLET(6.25 MG) BY MOUTH TWICE DAILY   diphenhydramine -acetaminophen  25-500 MG Tabs tablet Commonly known as: TYLENOL  PM Take 1 tablet by mouth at bedtime as needed.   Farxiga  10 MG Tabs tablet Generic drug: dapagliflozin   propanediol Take 1 tablet (10 mg total) by mouth daily before breakfast.   ferrous sulfate 324 MG Tbec Take 324 mg by mouth daily.   furosemide  40 MG tablet Commonly known as: LASIX  Take 1 tablet (40 mg total) by mouth 2 (two) times a week. Monday and Friday   hydroxyurea  500 MG capsule Commonly known as: HYDREA  TAKE 1 CAPSULE(500 MG) BY MOUTH DAILY. MAY TAKE WITH FOOD TO MINIMIZE GI SIDE EFFECTS   Magnesium 200 MG Tabs Take 1 tablet by mouth 2 (two) times daily.   multivitamin with minerals Tabs tablet Take 1 tablet by mouth daily.   spironolactone  25 MG tablet Commonly known as: ALDACTONE  Take 1 tablet (25 mg total) by mouth daily.   traZODone 50 MG tablet Commonly known as: DESYREL Take 50 mg by mouth at bedtime as needed.   Turmeric 500 MG Caps Take 1 capsule by mouth 2 (two) times daily.        Allergies: No Known Allergies  Past Medical History, Surgical history, Social history, and Family History were reviewed and updated.  Review of Systems: Review of Systems  Constitutional: Negative.   HENT: Negative.    Eyes: Negative.   Respiratory: Negative.    Cardiovascular: Negative.   Gastrointestinal: Negative.  Genitourinary: Negative.   Musculoskeletal: Negative.   Skin: Negative.   Neurological: Negative.   Endo/Heme/Allergies: Negative.   Psychiatric/Behavioral: Negative.        Physical Exam: Vital signs show a temperature of 98.1.  Pulse 70.  Blood pressure 112/75.  Weight is 145 pounds.    Wt Readings from Last 3 Encounters:  09/29/23 145 lb (65.8 kg)  07/28/23 146 lb (66.2 kg)  07/13/23 145 lb 9.6 oz (66 kg)   Physical Exam Vitals reviewed.  HENT:     Head: Normocephalic and atraumatic.   Eyes:     Pupils: Pupils are equal, round, and reactive to light.    Cardiovascular:     Rate and Rhythm: Normal rate and regular rhythm.     Heart sounds: Normal heart sounds.  Pulmonary:     Effort: Pulmonary effort is normal.     Breath  sounds: Normal breath sounds.  Abdominal:     General: Bowel sounds are normal.     Palpations: Abdomen is soft.   Musculoskeletal:        General: No tenderness or deformity. Normal range of motion.     Cervical back: Normal range of motion.  Lymphadenopathy:     Cervical: No cervical adenopathy.   Skin:    General: Skin is warm and dry.     Findings: No erythema or rash.   Neurological:     Mental Status: She is alert and oriented to person, place, and time.   Psychiatric:        Behavior: Behavior normal.        Thought Content: Thought content normal.        Judgment: Judgment normal.     Lab Results  Component Value Date   WBC 3.2 (L) 09/29/2023   HGB 13.8 09/29/2023   HCT 43.9 09/29/2023   MCV 103.1 (H) 09/29/2023   PLT 459 (H) 09/29/2023   Lab Results  Component Value Date   FERRITIN 57 07/28/2023   IRON 60 07/28/2023   TIBC 377 07/28/2023   UIBC 317 07/28/2023   IRONPCTSAT 16 07/28/2023   Lab Results  Component Value Date   RETICCTPCT 1.6 09/29/2023   RBC 4.27 09/29/2023   No results found for: KPAFRELGTCHN, LAMBDASER, KAPLAMBRATIO No results found for: IGGSERUM, IGA, IGMSERUM No results found for: TOTALPROTELP, ALBUMINELP, A1GS, A2GS, BETS, BETA2SER, GAMS, MSPIKE, SPEI   Chemistry      Component Value Date/Time   NA 141 07/28/2023 0801   K 5.2 (H) 07/28/2023 0801   CL 107 07/28/2023 0801   CO2 26 07/28/2023 0801   BUN 41 (H) 07/28/2023 0801   CREATININE 1.75 (H) 07/28/2023 0801      Component Value Date/Time   CALCIUM  9.9 07/28/2023 0801   ALKPHOS 70 07/28/2023 0801   AST 26 07/28/2023 0801   ALT 18 07/28/2023 0801   BILITOT 0.5 07/28/2023 0801       Impression and Plan: Tina Jenkins is a very pleasant 73 yo African American female with essential thrombocythemia, JAK 2 + and CALR +.  I am happy that her platelet count keeps coming down.  She is doing well on the Hydrea .  Her white cell count is little bit  lower but again not low enough that we will cause her problems.  We will try to get her through most of this Summer now.  We will try to get her back after Labor Day.  I think this would be quite reasonable.   Donata Fryer  Chrissie Coupe, MD 6/20/20258:20 AM

## 2023-10-17 ENCOUNTER — Other Ambulatory Visit (HOSPITAL_COMMUNITY): Payer: Self-pay | Admitting: *Deleted

## 2023-10-17 MED ORDER — SPIRONOLACTONE 25 MG PO TABS: 25.0000 mg | ORAL_TABLET | Freq: Every day | ORAL | 3 refills | Status: AC

## 2023-10-19 ENCOUNTER — Other Ambulatory Visit (HOSPITAL_COMMUNITY): Payer: Self-pay | Admitting: Internal Medicine

## 2023-12-04 ENCOUNTER — Other Ambulatory Visit (HOSPITAL_COMMUNITY): Payer: Self-pay | Admitting: Internal Medicine

## 2023-12-04 DIAGNOSIS — I5022 Chronic systolic (congestive) heart failure: Secondary | ICD-10-CM

## 2023-12-15 ENCOUNTER — Inpatient Hospital Stay: Attending: Hematology & Oncology

## 2023-12-15 ENCOUNTER — Inpatient Hospital Stay: Admitting: Hematology & Oncology

## 2023-12-15 ENCOUNTER — Ambulatory Visit: Payer: Self-pay | Admitting: Hematology & Oncology

## 2023-12-15 ENCOUNTER — Encounter: Payer: Self-pay | Admitting: Hematology & Oncology

## 2023-12-15 VITALS — BP 118/72 | HR 57 | Temp 97.9°F | Resp 18 | Ht 59.0 in | Wt 145.0 lb

## 2023-12-15 DIAGNOSIS — D473 Essential (hemorrhagic) thrombocythemia: Secondary | ICD-10-CM | POA: Insufficient documentation

## 2023-12-15 DIAGNOSIS — I509 Heart failure, unspecified: Secondary | ICD-10-CM

## 2023-12-15 LAB — CBC WITH DIFFERENTIAL (CANCER CENTER ONLY)
Abs Immature Granulocytes: 0.02 K/uL (ref 0.00–0.07)
Basophils Absolute: 0 K/uL (ref 0.0–0.1)
Basophils Relative: 1 %
Eosinophils Absolute: 0.2 K/uL (ref 0.0–0.5)
Eosinophils Relative: 5 %
HCT: 42.6 % (ref 36.0–46.0)
Hemoglobin: 13.7 g/dL (ref 12.0–15.0)
Immature Granulocytes: 1 %
Lymphocytes Relative: 24 %
Lymphs Abs: 0.9 K/uL (ref 0.7–4.0)
MCH: 32.9 pg (ref 26.0–34.0)
MCHC: 32.2 g/dL (ref 30.0–36.0)
MCV: 102.2 fL — ABNORMAL HIGH (ref 80.0–100.0)
Monocytes Absolute: 0.5 K/uL (ref 0.1–1.0)
Monocytes Relative: 14 %
Neutro Abs: 1.9 K/uL (ref 1.7–7.7)
Neutrophils Relative %: 55 %
Platelet Count: 461 K/uL — ABNORMAL HIGH (ref 150–400)
RBC: 4.17 MIL/uL (ref 3.87–5.11)
RDW: 13.5 % (ref 11.5–15.5)
WBC Count: 3.6 K/uL — ABNORMAL LOW (ref 4.0–10.5)
nRBC: 0 % (ref 0.0–0.2)

## 2023-12-15 LAB — CMP (CANCER CENTER ONLY)
ALT: 23 U/L (ref 0–44)
AST: 31 U/L (ref 15–41)
Albumin: 4.3 g/dL (ref 3.5–5.0)
Alkaline Phosphatase: 72 U/L (ref 38–126)
Anion gap: 10 (ref 5–15)
BUN: 41 mg/dL — ABNORMAL HIGH (ref 8–23)
CO2: 26 mmol/L (ref 22–32)
Calcium: 10.9 mg/dL — ABNORMAL HIGH (ref 8.9–10.3)
Chloride: 104 mmol/L (ref 98–111)
Creatinine: 1.83 mg/dL — ABNORMAL HIGH (ref 0.44–1.00)
GFR, Estimated: 28 mL/min — ABNORMAL LOW (ref >60)
Glucose, Bld: 104 mg/dL — ABNORMAL HIGH (ref 70–99)
Potassium: 4.9 mmol/L (ref 3.5–5.1)
Sodium: 140 mmol/L (ref 135–145)
Total Bilirubin: 0.4 mg/dL (ref 0.0–1.2)
Total Protein: 7 g/dL (ref 6.5–8.1)

## 2023-12-15 LAB — IRON AND IRON BINDING CAPACITY (CC-WL,HP ONLY)
Iron: 56 ug/dL (ref 28–170)
Saturation Ratios: 15 % (ref 10.4–31.8)
TIBC: 368 ug/dL (ref 250–450)
UIBC: 312 ug/dL

## 2023-12-15 LAB — FERRITIN: Ferritin: 114 ng/mL (ref 11–307)

## 2023-12-15 LAB — LACTATE DEHYDROGENASE: LDH: 233 U/L — ABNORMAL HIGH (ref 98–192)

## 2023-12-15 NOTE — Progress Notes (Signed)
 Hematology and Oncology Follow Up Visit  Tina Jenkins [id number] 1951-03-19 73 y.o. 12/15/2023   Principle Diagnosis:  Essential thrombocythemia-  JAK2+/CALR+   Current Therapy:        Hydrea  500 mg p.o. daily-changed on 04/21/2023  - started on 10/02/2021   Interim History:  [name] is here today for follow-up.  She is doing quite well.  She really has had no complaints since we last saw her.  She is doing well on the Hydrea .  She has had no problems with nausea or vomiting.  She has had no change in bowel or bladder habits.  Think she sees a Nephrologist in a week or so.  When we last saw her, her ferritin was 80 with an iron saturation of 15%.  She is taking oral iron.  She has had no bleeding.  Has been no cough or shortness of breath.  She has had no leg swelling.  She has had no pain in the hands or feet.  Overall, I would say that her performance status is ECOG 1.    Medications:  Allergies as of 12/15/2023   No Known Allergies      Medication List        Accurate as of December 15, 2023  8:39 AM. If you have any questions, ask your nurse or doctor.          aspirin  EC 81 MG tablet Take 81 mg by mouth daily.   atorvastatin  20 MG tablet Commonly known as: LIPITOR Take 20 mg by mouth at bedtime.   benazepril-hydrochlorthiazide 20-12.5 MG tablet Commonly known as: LOTENSIN HCT Take 1 tablet by mouth daily.   CALCIUM  1200 PO Take 1,200 mg by mouth 2 (two) times daily.   carvedilol  6.25 MG tablet Commonly known as: COREG  TAKE 1 TABLET(6.25 MG) BY MOUTH TWICE DAILY   diphenhydramine -acetaminophen  25-500 MG Tabs tablet Commonly known as: TYLENOL  PM Take 1 tablet by mouth at bedtime as needed.   Farxiga  10 MG Tabs tablet Generic drug: dapagliflozin  propanediol Take 1 tablet (10 mg total) by mouth daily before breakfast.   ferrous sulfate 324 MG Tbec Take 324 mg by mouth daily.   furosemide  40 MG tablet Commonly known as: LASIX  TAKE 1 TABLET BY MOUTH  TWICE A WEEK;-MONDAY AND FRIDAY   hydroxyurea  500 MG capsule Commonly known as: HYDREA  TAKE 1 CAPSULE(500 MG) BY MOUTH DAILY. MAY TAKE WITH FOOD TO MINIMIZE GI SIDE EFFECTS   Magnesium 200 MG Tabs Take 1 tablet by mouth 2 (two) times daily.   multivitamin with minerals Tabs tablet Take 1 tablet by mouth daily.   spironolactone  25 MG tablet Commonly known as: ALDACTONE  Take 1 tablet (25 mg total) by mouth daily.   traZODone 50 MG tablet Commonly known as: DESYREL Take 50 mg by mouth at bedtime as needed.   Turmeric 500 MG Caps Take 1 capsule by mouth 2 (two) times daily.        Allergies: No Known Allergies  Past Medical History, Surgical history, Social history, and Family History were reviewed and updated.  Review of Systems: Review of Systems  Constitutional: Negative.   HENT: Negative.    Eyes: Negative.   Respiratory: Negative.    Cardiovascular: Negative.   Gastrointestinal: Negative.   Genitourinary: Negative.   Musculoskeletal: Negative.   Skin: Negative.   Neurological: Negative.   Endo/Heme/Allergies: Negative.   Psychiatric/Behavioral: Negative.        Physical Exam: Vital signs show a temperature of 97.9.  Pulse 57.  Blood pressure 118/72.  Weight is 145 pounds.      Wt Readings from Last 3 Encounters:  12/15/23 145 lb (65.8 kg)  09/29/23 145 lb (65.8 kg)  07/28/23 146 lb (66.2 kg)   Physical Exam Vitals reviewed.  HENT:     Head: Normocephalic and atraumatic.  Eyes:     Pupils: Pupils are equal, round, and reactive to light.  Cardiovascular:     Rate and Rhythm: Normal rate and regular rhythm.     Heart sounds: Normal heart sounds.  Pulmonary:     Effort: Pulmonary effort is normal.     Breath sounds: Normal breath sounds.  Abdominal:     General: Bowel sounds are normal.     Palpations: Abdomen is soft.  Musculoskeletal:        General: No tenderness or deformity. Normal range of motion.     Cervical back: Normal range of motion.   Lymphadenopathy:     Cervical: No cervical adenopathy.  Skin:    General: Skin is warm and dry.     Findings: No erythema or rash.  Neurological:     Mental Status: She is alert and oriented to person, place, and time.  Psychiatric:        Behavior: Behavior normal.        Thought Content: Thought content normal.        Judgment: Judgment normal.     Lab Results  Component Value Date   WBC 3.6 (L) 12/15/2023   HGB 13.7 12/15/2023   HCT 42.6 12/15/2023   MCV 102.2 (H) 12/15/2023   PLT 461 (H) 12/15/2023   Lab Results  Component Value Date   FERRITIN 80 09/29/2023   IRON 55 09/29/2023   TIBC 371 09/29/2023   UIBC 316 09/29/2023   IRONPCTSAT 15 09/29/2023   Lab Results  Component Value Date   RETICCTPCT 1.6 09/29/2023   RBC 4.17 12/15/2023   No results found for: KPAFRELGTCHN, LAMBDASER, KAPLAMBRATIO No results found for: IGGSERUM, IGA, IGMSERUM No results found for: STEPHANY CARLOTA BENSON MARKEL EARLA JOANNIE DOC VICK, SPEI   Chemistry      Component Value Date/Time   NA 141 09/29/2023 0750   K 4.9 09/29/2023 0750   CL 102 09/29/2023 0750   CO2 31 09/29/2023 0750   BUN 41 (H) 09/29/2023 0750   CREATININE 1.99 (H) 09/29/2023 0750      Component Value Date/Time   CALCIUM  10.6 (H) 09/29/2023 0750   ALKPHOS 78 09/29/2023 0750   AST 26 09/29/2023 0750   ALT 17 09/29/2023 0750   BILITOT 0.6 09/29/2023 0750       Impression and Plan: Tina Jenkins is a very pleasant 73 yo African American female with essential thrombocythemia, JAK 2 + and CALR +.  Everything is holding very steady.  I am very happy with this.  I do not see that we had to make any change in the Hydrea .  We will plan to get her back in a couple months.  Will try to work the schedule so that she can make it through the Holiday season without having to come back in here.    Maude JONELLE Crease, MD 9/5/20258:39 AM

## 2024-02-12 ENCOUNTER — Other Ambulatory Visit (HOSPITAL_COMMUNITY): Payer: Self-pay | Admitting: Internal Medicine

## 2024-02-12 DIAGNOSIS — I5022 Chronic systolic (congestive) heart failure: Secondary | ICD-10-CM

## 2024-02-16 ENCOUNTER — Inpatient Hospital Stay: Admitting: Hematology & Oncology

## 2024-02-16 ENCOUNTER — Encounter: Payer: Self-pay | Admitting: Hematology & Oncology

## 2024-02-16 ENCOUNTER — Inpatient Hospital Stay: Attending: Hematology & Oncology

## 2024-02-16 VITALS — BP 134/74 | HR 64 | Temp 98.4°F | Resp 20 | Ht 59.0 in | Wt 143.8 lb

## 2024-02-16 DIAGNOSIS — D473 Essential (hemorrhagic) thrombocythemia: Secondary | ICD-10-CM

## 2024-02-16 LAB — CMP (CANCER CENTER ONLY)
ALT: 20 U/L (ref 0–44)
AST: 25 U/L (ref 15–41)
Albumin: 4.3 g/dL (ref 3.5–5.0)
Alkaline Phosphatase: 69 U/L (ref 38–126)
Anion gap: 11 (ref 5–15)
BUN: 32 mg/dL — ABNORMAL HIGH (ref 8–23)
CO2: 26 mmol/L (ref 22–32)
Calcium: 10.6 mg/dL — ABNORMAL HIGH (ref 8.9–10.3)
Chloride: 102 mmol/L (ref 98–111)
Creatinine: 1.99 mg/dL — ABNORMAL HIGH (ref 0.44–1.00)
GFR, Estimated: 26 mL/min — ABNORMAL LOW (ref >60)
Glucose, Bld: 107 mg/dL — ABNORMAL HIGH (ref 70–99)
Potassium: 5.4 mmol/L — ABNORMAL HIGH (ref 3.5–5.1)
Sodium: 139 mmol/L (ref 135–145)
Total Bilirubin: 0.6 mg/dL (ref 0.0–1.2)
Total Protein: 6.9 g/dL (ref 6.5–8.1)

## 2024-02-16 LAB — CBC WITH DIFFERENTIAL (CANCER CENTER ONLY)
Abs Immature Granulocytes: 0.04 K/uL (ref 0.00–0.07)
Basophils Absolute: 0 K/uL (ref 0.0–0.1)
Basophils Relative: 1 %
Eosinophils Absolute: 0.1 K/uL (ref 0.0–0.5)
Eosinophils Relative: 2 %
HCT: 45.5 % (ref 36.0–46.0)
Hemoglobin: 14.3 g/dL (ref 12.0–15.0)
Immature Granulocytes: 1 %
Lymphocytes Relative: 23 %
Lymphs Abs: 0.9 K/uL (ref 0.7–4.0)
MCH: 32.6 pg (ref 26.0–34.0)
MCHC: 31.4 g/dL (ref 30.0–36.0)
MCV: 103.6 fL — ABNORMAL HIGH (ref 80.0–100.0)
Monocytes Absolute: 0.6 K/uL (ref 0.1–1.0)
Monocytes Relative: 15 %
Neutro Abs: 2.2 K/uL (ref 1.7–7.7)
Neutrophils Relative %: 58 %
Platelet Count: 460 K/uL — ABNORMAL HIGH (ref 150–400)
RBC: 4.39 MIL/uL (ref 3.87–5.11)
RDW: 14.2 % (ref 11.5–15.5)
WBC Count: 3.8 K/uL — ABNORMAL LOW (ref 4.0–10.5)
nRBC: 0 % (ref 0.0–0.2)

## 2024-02-16 LAB — IRON AND IRON BINDING CAPACITY (CC-WL,HP ONLY)
Iron: 97 ug/dL (ref 28–170)
Saturation Ratios: 28 % (ref 10.4–31.8)
TIBC: 347 ug/dL (ref 250–450)
UIBC: 250 ug/dL

## 2024-02-16 LAB — SAVE SMEAR(SSMR), FOR PROVIDER SLIDE REVIEW

## 2024-02-16 LAB — FERRITIN: Ferritin: 150 ng/mL (ref 11–307)

## 2024-02-16 NOTE — Progress Notes (Signed)
 Hematology and Oncology Follow Up Visit  Tina Jenkins [id number] 02/25/1951 73 y.o. 02/16/2024   Principle Diagnosis:  Essential thrombocythemia-  JAK2+/CALR+   Current Therapy:        Hydrea  500 mg p.o. daily-changed on 04/21/2023  - started on 10/02/2021   Interim History:  Tina Jenkins is here today for follow-up.  Surprisingly, she comes in without her husband.  He is busy working..  She is doing okay.  She really has had no complaints since we last saw her.  She has had no problems with the Hydrea .  She is taking this daily.  She has had no problems with nausea or vomiting.  She has had no cough or shortness of breath.  She has had no bleeding.  Is been no rashes.  She has had no leg swelling..  She does see Nephrology.  So far, her kidney function has been doing okay.    When we last saw her, her ferritin was 114 with an iron saturation of 15%.     Medications:  Allergies as of 02/16/2024   No Known Allergies      Medication List        Accurate as of February 16, 2024  8:45 AM. If you have any questions, ask your nurse or doctor.          aspirin  EC 81 MG tablet Take 81 mg by mouth daily.   atorvastatin  20 MG tablet Commonly known as: LIPITOR Take 20 mg by mouth at bedtime.   benazepril-hydrochlorthiazide 20-12.5 MG tablet Commonly known as: LOTENSIN HCT Take 1 tablet by mouth daily.   CALCIUM  1200 PO Take 1,200 mg by mouth 2 (two) times daily.   carvedilol  6.25 MG tablet Commonly known as: COREG  TAKE 1 TABLET(6.25 MG) BY MOUTH TWICE DAILY   diphenhydramine -acetaminophen  25-500 MG Tabs tablet Commonly known as: TYLENOL  PM Take 1 tablet by mouth at bedtime as needed.   Farxiga  10 MG Tabs tablet Generic drug: dapagliflozin  propanediol Take 1 tablet (10 mg total) by mouth daily before breakfast.   ferrous sulfate 324 MG Tbec Take 324 mg by mouth daily.   furosemide  40 MG tablet Commonly known as: LASIX  TAKE 1 TABLET BY MOUTH TWICE A WEEK;-MONDAY AND  FRIDAY   hydroxyurea  500 MG capsule Commonly known as: HYDREA  TAKE 1 CAPSULE(500 MG) BY MOUTH DAILY. MAY TAKE WITH FOOD TO MINIMIZE GI SIDE EFFECTS   Magnesium 200 MG Tabs Take 1 tablet by mouth 2 (two) times daily.   multivitamin with minerals Tabs tablet Take 1 tablet by mouth daily.   spironolactone  25 MG tablet Commonly known as: ALDACTONE  Take 1 tablet (25 mg total) by mouth daily.   traMADol 50 MG tablet Commonly known as: ULTRAM Take 50 mg by mouth 2 (two) times daily as needed.   traZODone 50 MG tablet Commonly known as: DESYREL Take 50 mg by mouth at bedtime as needed.   Turmeric 500 MG Caps Take 1 capsule by mouth 2 (two) times daily.        Allergies: No Known Allergies  Past Medical History, Surgical history, Social history, and Family History were reviewed and updated.  Review of Systems: Review of Systems  Constitutional: Negative.   HENT: Negative.    Eyes: Negative.   Respiratory: Negative.    Cardiovascular: Negative.   Gastrointestinal: Negative.   Genitourinary: Negative.   Musculoskeletal: Negative.   Skin: Negative.   Neurological: Negative.   Endo/Heme/Allergies: Negative.   Psychiatric/Behavioral: Negative.  Physical Exam: Vital signs show a temperature of 97.9.  Pulse 57.  Blood pressure 118/72.  Weight is 145 pounds.      Wt Readings from Last 3 Encounters:  02/16/24 143 lb 12.8 oz (65.2 kg)  12/15/23 145 lb (65.8 kg)  09/29/23 145 lb (65.8 kg)   Physical Exam Vitals reviewed.  HENT:     Head: Normocephalic and atraumatic.  Eyes:     Pupils: Pupils are equal, round, and reactive to light.  Cardiovascular:     Rate and Rhythm: Normal rate and regular rhythm.     Heart sounds: Normal heart sounds.  Pulmonary:     Effort: Pulmonary effort is normal.     Breath sounds: Normal breath sounds.  Abdominal:     General: Bowel sounds are normal.     Palpations: Abdomen is soft.  Musculoskeletal:        General: No  tenderness or deformity. Normal range of motion.     Cervical back: Normal range of motion.  Lymphadenopathy:     Cervical: No cervical adenopathy.  Skin:    General: Skin is warm and dry.     Findings: No erythema or rash.  Neurological:     Mental Status: She is alert and oriented to person, place, and time.  Psychiatric:        Behavior: Behavior normal.        Thought Content: Thought content normal.        Judgment: Judgment normal.     Lab Results  Component Value Date   WBC 3.8 (L) 02/16/2024   HGB 14.3 02/16/2024   HCT 45.5 02/16/2024   MCV 103.6 (H) 02/16/2024   PLT 460 (H) 02/16/2024   Lab Results  Component Value Date   FERRITIN 114 12/15/2023   IRON 56 12/15/2023   TIBC 368 12/15/2023   UIBC 312 12/15/2023   IRONPCTSAT 15 12/15/2023   Lab Results  Component Value Date   RETICCTPCT 1.6 09/29/2023   RBC 4.39 02/16/2024   No results found for: KPAFRELGTCHN, LAMBDASER, KAPLAMBRATIO No results found for: IGGSERUM, IGA, IGMSERUM No results found for: TOTALPROTELP, ALBUMINELP, A1GS, A2GS, BETS, BETA2SER, GAMS, MSPIKE, SPEI   Chemistry      Component Value Date/Time   NA 139 02/16/2024 0800   K 5.4 (H) 02/16/2024 0800   CL 102 02/16/2024 0800   CO2 26 02/16/2024 0800   BUN 32 (H) 02/16/2024 0800   CREATININE 1.99 (H) 02/16/2024 0800      Component Value Date/Time   CALCIUM  10.6 (H) 02/16/2024 0800   ALKPHOS 69 02/16/2024 0800   AST 25 02/16/2024 0800   ALT 20 02/16/2024 0800   BILITOT 0.6 02/16/2024 0800       Impression and Plan: Tina Jenkins is a very pleasant 73 yo African American female with essential thrombocythemia, JAK 2 + and CALR +.  Everything is holding very steady.  I am very happy with this.  I do not see that we had to make any change in the Hydrea .  We will now try to get her through most of the Winter.  I think we can do this.  I know that she will have a wonderful Holiday season.    Maude JONELLE Crease, MD 11/7/20258:45 AM

## 2024-03-14 ENCOUNTER — Other Ambulatory Visit (HOSPITAL_COMMUNITY): Payer: Self-pay

## 2024-04-17 ENCOUNTER — Other Ambulatory Visit (HOSPITAL_BASED_OUTPATIENT_CLINIC_OR_DEPARTMENT_OTHER): Payer: Self-pay

## 2024-04-29 ENCOUNTER — Other Ambulatory Visit: Payer: Self-pay | Admitting: *Deleted

## 2024-04-29 MED ORDER — HYDROXYUREA 500 MG PO CAPS: 500.0000 mg | ORAL_CAPSULE | Freq: Every day | ORAL | 6 refills | Status: AC

## 2024-05-09 ENCOUNTER — Other Ambulatory Visit (HOSPITAL_COMMUNITY): Payer: Self-pay | Admitting: Internal Medicine

## 2024-05-09 DIAGNOSIS — I5022 Chronic systolic (congestive) heart failure: Secondary | ICD-10-CM

## 2024-05-16 ENCOUNTER — Other Ambulatory Visit (HOSPITAL_BASED_OUTPATIENT_CLINIC_OR_DEPARTMENT_OTHER): Payer: Self-pay

## 2024-05-24 ENCOUNTER — Inpatient Hospital Stay: Admitting: Hematology & Oncology

## 2024-05-24 ENCOUNTER — Inpatient Hospital Stay: Attending: Hematology & Oncology
